# Patient Record
Sex: Female | Born: 1970 | Race: White | Hispanic: No | Marital: Married | Smoking: Never smoker
Health system: Southern US, Community
[De-identification: ages and names within clinical notes are randomized; demographics above are authoritative.]

## PROBLEM LIST (undated history)

## (undated) DIAGNOSIS — I7300 Raynaud's syndrome without gangrene: Secondary | ICD-10-CM

## (undated) DIAGNOSIS — R9409 Abnormal results of other function studies of central nervous system: Secondary | ICD-10-CM

## (undated) DIAGNOSIS — G935 Compression of brain: Secondary | ICD-10-CM

## (undated) DIAGNOSIS — I498 Other specified cardiac arrhythmias: Secondary | ICD-10-CM

## (undated) DIAGNOSIS — I639 Cerebral infarction, unspecified: Secondary | ICD-10-CM

## (undated) DIAGNOSIS — Q796 Ehlers-Danlos syndrome, unspecified: Secondary | ICD-10-CM

## (undated) DIAGNOSIS — T4145XA Adverse effect of unspecified anesthetic, initial encounter: Secondary | ICD-10-CM

## (undated) DIAGNOSIS — D4709 Other mast cell neoplasms of uncertain behavior: Secondary | ICD-10-CM

## (undated) DIAGNOSIS — G90A Postural orthostatic tachycardia syndrome (POTS): Secondary | ICD-10-CM

## (undated) DIAGNOSIS — T8859XA Other complications of anesthesia, initial encounter: Secondary | ICD-10-CM

## (undated) DIAGNOSIS — I951 Orthostatic hypotension: Secondary | ICD-10-CM

## (undated) DIAGNOSIS — G932 Benign intracranial hypertension: Secondary | ICD-10-CM

## (undated) DIAGNOSIS — M419 Scoliosis, unspecified: Secondary | ICD-10-CM

## (undated) DIAGNOSIS — Z9889 Other specified postprocedural states: Secondary | ICD-10-CM

## (undated) DIAGNOSIS — J40 Bronchitis, not specified as acute or chronic: Secondary | ICD-10-CM

## (undated) DIAGNOSIS — R000 Tachycardia, unspecified: Secondary | ICD-10-CM

## (undated) DIAGNOSIS — G43909 Migraine, unspecified, not intractable, without status migrainosus: Secondary | ICD-10-CM

## (undated) DIAGNOSIS — R569 Unspecified convulsions: Secondary | ICD-10-CM

## (undated) DIAGNOSIS — J189 Pneumonia, unspecified organism: Secondary | ICD-10-CM

## (undated) DIAGNOSIS — A0472 Enterocolitis due to Clostridium difficile, not specified as recurrent: Secondary | ICD-10-CM

## (undated) DIAGNOSIS — N319 Neuromuscular dysfunction of bladder, unspecified: Secondary | ICD-10-CM

## (undated) DIAGNOSIS — T7840XA Allergy, unspecified, initial encounter: Secondary | ICD-10-CM

## (undated) DIAGNOSIS — Q760 Spina bifida occulta: Secondary | ICD-10-CM

## (undated) DIAGNOSIS — B019 Varicella without complication: Secondary | ICD-10-CM

## (undated) DIAGNOSIS — G500 Trigeminal neuralgia: Secondary | ICD-10-CM

## (undated) DIAGNOSIS — K219 Gastro-esophageal reflux disease without esophagitis: Secondary | ICD-10-CM

## (undated) DIAGNOSIS — R112 Nausea with vomiting, unspecified: Secondary | ICD-10-CM

## (undated) DIAGNOSIS — R55 Syncope and collapse: Secondary | ICD-10-CM

## (undated) DIAGNOSIS — F319 Bipolar disorder, unspecified: Secondary | ICD-10-CM

## (undated) HISTORY — DX: Trigeminal neuralgia: G50.0

## (undated) HISTORY — PX: OTHER SURGICAL HISTORY: SHX169

## (undated) HISTORY — DX: Scoliosis, unspecified: M41.9

## (undated) HISTORY — DX: Bronchitis, not specified as acute or chronic: J40

## (undated) HISTORY — PX: ROBOTIC ASSISTED LAP VAGINAL HYSTERECTOMY: SHX2362

## (undated) HISTORY — PX: CRANIECTOMY SUBOCCIPITAL W/ CERVICAL LAMINECTOMY / CHIARI: SUR327

## (undated) HISTORY — DX: Varicella without complication: B01.9

## (undated) HISTORY — DX: Enterocolitis due to Clostridium difficile, not specified as recurrent: A04.72

## (undated) HISTORY — DX: Abnormal results of other function studies of central nervous system: R94.09

## (undated) HISTORY — DX: Compression of brain: G93.5

## (undated) HISTORY — DX: Benign intracranial hypertension: G93.2

## (undated) HISTORY — DX: Other mast cell neoplasms of uncertain behavior: D47.09

## (undated) HISTORY — DX: Spina bifida occulta: Q76.0

## (undated) HISTORY — DX: Syncope and collapse: R55

## (undated) HISTORY — DX: Ehlers-Danlos syndrome, unspecified: Q79.60

## (undated) HISTORY — DX: Allergy, unspecified, initial encounter: T78.40XA

## (undated) HISTORY — DX: Migraine, unspecified, not intractable, without status migrainosus: G43.909

## (undated) HISTORY — DX: Gastro-esophageal reflux disease without esophagitis: K21.9

## (undated) HISTORY — DX: Raynaud's syndrome without gangrene: I73.00

---
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
## 2006-02-24 ENCOUNTER — Ambulatory Visit: Payer: Self-pay | Admitting: Critical Care Medicine

## 2006-06-19 ENCOUNTER — Ambulatory Visit (HOSPITAL_COMMUNITY): Admission: RE | Admit: 2006-06-19 | Discharge: 2006-06-19 | Payer: Self-pay | Admitting: Internal Medicine

## 2007-04-13 ENCOUNTER — Encounter (INDEPENDENT_AMBULATORY_CARE_PROVIDER_SITE_OTHER): Payer: Self-pay | Admitting: Internal Medicine

## 2007-04-13 ENCOUNTER — Ambulatory Visit: Payer: Self-pay

## 2007-05-21 HISTORY — PX: ACHILLES TENDON LENGTHENING: SUR826

## 2008-01-19 HISTORY — PX: ACHILLES TENDON REPAIR: SUR1153

## 2008-03-13 ENCOUNTER — Emergency Department (HOSPITAL_BASED_OUTPATIENT_CLINIC_OR_DEPARTMENT_OTHER): Admission: EM | Admit: 2008-03-13 | Discharge: 2008-03-13 | Payer: Self-pay | Admitting: Emergency Medicine

## 2008-05-06 ENCOUNTER — Ambulatory Visit (HOSPITAL_COMMUNITY): Admission: RE | Admit: 2008-05-06 | Discharge: 2008-05-06 | Payer: Self-pay | Admitting: Internal Medicine

## 2008-05-20 HISTORY — PX: ACHILLES TENDON REPAIR: SUR1153

## 2008-10-30 ENCOUNTER — Emergency Department (HOSPITAL_BASED_OUTPATIENT_CLINIC_OR_DEPARTMENT_OTHER): Admission: EM | Admit: 2008-10-30 | Discharge: 2008-10-30 | Payer: Self-pay | Admitting: Emergency Medicine

## 2008-11-24 ENCOUNTER — Emergency Department (HOSPITAL_COMMUNITY): Admission: EM | Admit: 2008-11-24 | Discharge: 2008-11-24 | Payer: Self-pay | Admitting: Emergency Medicine

## 2009-01-02 ENCOUNTER — Ambulatory Visit (HOSPITAL_COMMUNITY): Admission: RE | Admit: 2009-01-02 | Discharge: 2009-01-02 | Payer: Self-pay | Admitting: Internal Medicine

## 2009-05-20 HISTORY — PX: ABDOMINAL HYSTERECTOMY: SHX81

## 2010-10-05 NOTE — Assessment & Plan Note (Signed)
[facility]                               PULMONARY OFFICE NOTE   Jamie Baker, Jamie Baker                          MRN:          [id number]  DATE:02/24/2006                            DOB:          [birth year]    PULMONARY CONSULTATION:   CHIEF COMPLAINT:  Evaluate recurrent pneumonia.   HISTORY OF PRESENT ILLNESS:  A 40 year old white female who has had chronic  recurrent pain that is very deep into the breast area, sinking, goes through  the patient the back and up into the neck.  She is dyspneic with exertion  and also with activities of daily living.  She has a dry, nonproductive  cough.  The pain is sharp and stabbing, worse with a deep breath.  Goes up  into the ears.  She has increased acid reflux that occurs all day long and  also at night.  She has been particularly short of breath since the end of  August, getting worse.  Has an increased stopped-up nose and nasal  congestion.  She has had noted increased wheezing.  She was treated with  Rocephin in August, then followed this with Avelox, azithromycin and then  Medrol Dosepak, which she just finished yesterday.  She uses Ventolin, which  helps to some degree.  She has smoked 1-2 cigarettes a day for two years but  quit smoking 20 years ago.  She is referred for further evaluation.   PAST MEDICAL HISTORY:  Medical:  Essentially negative medical history except  for allergies and a tubal ligation in 1989.   MEDICATION ALLERGIES:  None.   CURRENT MEDICATIONS:  1. Microgestin birth control pills daily.  2. Multivitamin daily.  3. Omega-3 daily.  4. Ventolin p.r.n.  5. Allegra-D p.r.n.  6. Mucinex p.r.n.  7. Imitrex p.r.n.   SOCIAL HISTORY:  As noted above.  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  Lives with her husband and son.   FAMILY HISTORY:  Father had hypertension.  Mother had a stroke.   REVIEW OF SYSTEMS:  Otherwise noncontributory.  Does have some sinus  congestion, headache,  sore throat and difficulty swallowing.   PHYSICAL EXAMINATION:  GENERAL:  This is a youthful female in no distress.  VITAL SIGNS:  Temperature 98.5, blood pressure 108/70, pulse 89, saturation  98% on room air.  CHEST:  Completely clear without evidence of any wheeze, rale or rhonchi.  There was no evidence of any adventitious breath sounds.  CARDIAC:  A regular rate and rhythm without S3, normal S1, S2.  ABDOMEN:  Soft, nontender.  EXTREMITIES:  No edema, clubbing or venous disease.  NEUROLOGIC:  Intact.  HEENT:  No jugular venous distention, no lymphadenopathy.  Oropharynx clear.  NECK:  Supple.   LABORATORY DATA:  Spirometry was obtained today and showed an FEV1 of 2.91,  FVC of 2.32, FEV1/FVC ratio of 88% of predicted.  CT scan of chest was  obtained on February 17, 2006, and showed lungs to be clear, no pulmonary  emboli seen, no atelectasis was seen.   IMPRESSION:  Reflux disease with  no evidence of overt active pneumonia, no  evidence of lower airway inflammation or airway obstruction, with vocal cord  dysfunction syndrome currently.  Most of the chest wall pain could be  musculoskeletal in nature, but much of the pain now may well be reflux-  induced esophagitis.   RECOMMENDATIONS:  Begin Zegerid 40 mg daily, and a reflux diet was given to  the patient.  No inhaled medicines were prescribed.  We will see how the  patient tolerates this plan of care, see her back in return follow-up in one  month for reassessment.  Subsequent GI consultation may be necessary.       Charlcie Cradle Delford Field, MD, FCCP      PEW/MedQ  DD:  02/25/2006  DT:  02/27/2006  Job #:  [id number]   cc:   Lovenia Kim, D.O.

## 2011-05-21 HISTORY — PX: OTHER SURGICAL HISTORY: SHX169

## 2011-06-29 LAB — HM PAP SMEAR: HM Pap smear: NORMAL

## 2011-11-19 ENCOUNTER — Ambulatory Visit (INDEPENDENT_AMBULATORY_CARE_PROVIDER_SITE_OTHER): Payer: BC Managed Care – PPO | Admitting: Family Medicine

## 2011-11-19 ENCOUNTER — Encounter: Payer: Self-pay | Admitting: Family Medicine

## 2011-11-19 VITALS — BP 112/70 | HR 70 | Temp 98.4°F | Ht 64.0 in | Wt 158.4 lb

## 2011-11-19 DIAGNOSIS — G935 Compression of brain: Secondary | ICD-10-CM | POA: Insufficient documentation

## 2011-11-19 DIAGNOSIS — Z0000 Encounter for general adult medical examination without abnormal findings: Secondary | ICD-10-CM | POA: Insufficient documentation

## 2011-11-19 DIAGNOSIS — R209 Unspecified disturbances of skin sensation: Secondary | ICD-10-CM

## 2011-11-19 DIAGNOSIS — R202 Paresthesia of skin: Secondary | ICD-10-CM | POA: Insufficient documentation

## 2011-11-19 DIAGNOSIS — R200 Anesthesia of skin: Secondary | ICD-10-CM

## 2011-11-19 LAB — CBC WITH DIFFERENTIAL/PLATELET
Eosinophils Relative: 1 % (ref 0.0–5.0)
Hemoglobin: 13.7 g/dL (ref 12.0–15.0)
Monocytes Absolute: 0.5 10*3/uL (ref 0.1–1.0)
Neutro Abs: 3.3 10*3/uL (ref 1.4–7.7)
Neutrophils Relative %: 62.9 % (ref 43.0–77.0)
RDW: 13.2 % (ref 11.5–14.6)
WBC: 5.3 10*3/uL (ref 4.5–10.5)

## 2011-11-19 LAB — BASIC METABOLIC PANEL
BUN: 12 mg/dL (ref 6–23)
Chloride: 107 mEq/L (ref 96–112)
GFR: 98.48 mL/min (ref >60.00)
Glucose, Bld: 82 mg/dL (ref 70–99)

## 2011-11-19 LAB — HEPATIC FUNCTION PANEL
ALT: 15 U/L (ref 0–35)
AST: 15 U/L (ref 0–37)
Alkaline Phosphatase: 68 U/L (ref 39–117)

## 2011-11-19 LAB — LIPID PANEL
Cholesterol: 186 mg/dL (ref 0–200)
LDL Cholesterol: 117 mg/dL — ABNORMAL HIGH (ref 0–99)
Total CHOL/HDL Ratio: 3
Triglycerides: 54 mg/dL (ref 0.0–149.0)
VLDL: 10.8 mg/dL (ref 0.0–40.0)

## 2011-11-19 NOTE — Progress Notes (Signed)
  Subjective:    Patient ID: Jamie Baker, female    DOB: 1971/03/12, 41 y.o.   MRN: [id number]  HPI New to establish.  Previous MD- Linton Rump Elisabeth Most.  GYN- Primitivo Gauze.  Neurosurgeon- Kendell Bane  Neuro- Aletha Halim  Desires CPE.  No concerns about health other than current neuro issues.   Review of Systems Patient reports no vision/ hearing changes, adenopathy,fever, weight change,  persistant/recurrent hoarseness , swallowing issues, chest pain, palpitations, edema, persistant/recurrent cough, hemoptysis, dyspnea (rest/exertional/paroxysmal nocturnal), gastrointestinal bleeding (melena, rectal bleeding), abdominal pain, significant heartburn, bowel changes, GU symptoms (dysuria, hematuria, incontinence), Gyn symptoms (abnormal  bleeding, pain),  syncope, focal weakness, memory loss, skin/hair/nail changes, abnormal bruising or bleeding, anxiety, or depression.   + numbness and tingling in hands and feet    Objective:   Physical Exam General Appearance:    Alert, cooperative, no distress, appears stated age  Head:    Normocephalic, without obvious abnormality, atraumatic  Eyes:    PERRL, conjunctiva/corneas clear, EOM's intact, fundi    benign, both eyes  Ears:    Normal TM's and external ear canals, both ears  Nose:   Nares normal, septum midline, mucosa normal, no drainage    or sinus tenderness  Throat:   Lips, mucosa, and tongue normal; teeth and gums normal  Neck:   Supple, symmetrical, trachea midline, no adenopathy;    Thyroid: no enlargement/tenderness/nodules  Back:     Symmetric, no curvature, ROM normal, no CVA tenderness  Lungs:     Clear to auscultation bilaterally, respirations unlabored  Chest Wall:    No tenderness or deformity   Heart:    Regular rate and rhythm, S1 and S2 normal, no murmur, rub   or gallop  Breast Exam:    Deferred to GYN  Abdomen:     Soft, non-tender, bowel sounds active all four quadrants,    no masses, no organomegaly  Genitalia:    Deferred to  GYN  Rectal:    Extremities:   Extremities normal, atraumatic, no cyanosis or edema  Pulses:   2+ and symmetric all extremities  Skin:   Skin color, texture, turgor normal, no rashes or lesions  Lymph nodes:   Cervical, supraclavicular, and axillary nodes normal  Neurologic:   CNII-XII intact, normal strength, sensation and reflexes    throughout          Assessment & Plan:

## 2011-11-19 NOTE — Patient Instructions (Addendum)
We'll notify you of your lab results and make any changes if needed You look great!  Do not stress about the weight Make sure you call neuro and neurosurg about the numbness- we'll rule out the metabolic stuff Call with any questions or concerns Think of [place] as your home base Welcome!  We're glad to have you!!!

## 2011-11-19 NOTE — Assessment & Plan Note (Signed)
New.  Pt w/ multiple neuro/neurosurg issues and is s/p recent surgery for Chiari malformation.  Will check labs to r/o metabolic causes of pt's sxs but strongly encouraged her to call her specialists to discuss sxs.  Pt expressed understanding and is in agreement w/ plan.

## 2011-11-19 NOTE — Assessment & Plan Note (Signed)
New.  Pt's PE WNL.  UTD on GYN.  Check labs.  Anticipatory guidance provided.  

## 2011-11-22 LAB — VITAMIN D 1,25 DIHYDROXY
Vitamin D 1, 25 (OH)2 Total: 50 pg/mL (ref 18–72)
Vitamin D2 1, 25 (OH)2: 12 pg/mL

## 2011-11-25 ENCOUNTER — Encounter: Payer: Self-pay | Admitting: *Deleted

## 2012-05-20 HISTORY — PX: OTHER SURGICAL HISTORY: SHX169

## 2012-07-04 ENCOUNTER — Other Ambulatory Visit: Payer: Self-pay

## 2012-10-07 ENCOUNTER — Encounter: Payer: Self-pay | Admitting: Family Medicine

## 2012-10-07 ENCOUNTER — Ambulatory Visit (INDEPENDENT_AMBULATORY_CARE_PROVIDER_SITE_OTHER): Payer: BC Managed Care – PPO | Admitting: Family Medicine

## 2012-10-07 VITALS — BP 100/68 | HR 84 | Temp 98.2°F | Ht 63.75 in | Wt 163.0 lb

## 2012-10-07 DIAGNOSIS — Z01818 Encounter for other preprocedural examination: Secondary | ICD-10-CM

## 2012-10-07 LAB — PROTIME-INR: Prothrombin Time: 10.3 s (ref 10.2–12.4)

## 2012-10-07 LAB — BASIC METABOLIC PANEL
BUN: 6 mg/dL (ref 6–23)
Calcium: 8.7 mg/dL (ref 8.4–10.5)
Chloride: 102 mEq/L (ref 96–112)
GFR: 98.07 mL/min (ref >60.00)
Potassium: 3.6 mEq/L (ref 3.5–5.1)

## 2012-10-07 LAB — CBC WITH DIFFERENTIAL/PLATELET
Basophils Relative: 0.3 % (ref 0.0–3.0)
Hemoglobin: 12.6 g/dL (ref 12.0–15.0)
Lymphocytes Relative: 23 % (ref 12.0–46.0)
Lymphs Abs: 1.4 10*3/uL (ref 0.7–4.0)
MCV: 87.5 fl (ref 78.0–100.0)
Neutro Abs: 4 10*3/uL (ref 1.4–7.7)
Platelets: 194 10*3/uL (ref 150.0–400.0)
WBC: 6.1 10*3/uL (ref 4.5–10.5)

## 2012-10-07 LAB — HEPATIC FUNCTION PANEL
ALT: 28 U/L (ref 0–35)
Albumin: 3.8 g/dL (ref 3.5–5.2)
Bilirubin, Direct: 0 mg/dL (ref 0.0–0.3)
Total Bilirubin: 0.5 mg/dL (ref 0.3–1.2)

## 2012-10-07 LAB — APTT: aPTT: 25.9 s (ref 21.7–28.8)

## 2012-10-07 NOTE — Patient Instructions (Addendum)
We will fax all labs and office note to Dr Orson Aloe Stop the Aspirin 7-14 days prior to surgery Call with any questions or concerns Good Luck!

## 2012-10-07 NOTE — Progress Notes (Signed)
Pt could not use the bathroom , took specimen home will bring back.Marland KitchenMarland Kitchen

## 2012-10-07 NOTE — Progress Notes (Signed)
Subjective:    Jamie Baker is a 42 y.o. female who presents to the office today for a preoperative consultation at the request of surgeon Dr Orson Aloe who plans on performing Anterior Cervical Discectomy on June 3. This consultation is requested for the specific conditions prompting preoperative evaluation (i.e. because of potential affect on operative risk): no clear pre-op risk. Planned anesthesia: general. The patient has the following known anesthesia issues: no past problems w/ anesthesia. Patients bleeding risk: no recent abnormal bleeding, no remote history of abnormal bleeding and pt plans to stop ASA 7 days prior to surgery. Patient does not have objections to receiving blood products if needed.  The following portions of the patient's history were reviewed and updated as appropriate: allergies, current medications, past family history, past medical history, past social history, past surgical history and problem list.  Review of Systems A comprehensive review of systems was negative except for: headache due to chiari, neck pain.    Objective:    BP 100/68  Pulse 84  Temp(Src) 98.2 F (36.8 C) (Oral)  Ht 5' 3.75" (1.619 m)  Wt 163 lb (73.936 kg)  BMI 28.21 kg/m2  SpO2 95%   General Appearance:    Alert, cooperative, no distress, appears stated age  Head:    Normocephalic, without obvious abnormality, atraumatic  Eyes:    PERRL, conjunctiva/corneas clear, EOM's intact, fundi    benign, both eyes  Ears:    Normal TM's and external ear canals, both ears  Nose:   Nares normal, septum midline, mucosa normal, no drainage    or sinus tenderness  Throat:   Lips, mucosa, and tongue normal; teeth and gums normal  Neck:   Supple, symmetrical, trachea midline, no adenopathy;    Thyroid: no enlargement/tenderness/nodules  Back:     Symmetric, no curvature, ROM normal, no CVA tenderness  Lungs:     Clear to auscultation bilaterally, respirations unlabored  Chest Wall:    No tenderness  or deformity   Heart:    Regular rate and rhythm, S1 and S2 normal, no murmur, rub   or gallop  Breast Exam:    No tenderness, masses, or nipple abnormality  Abdomen:     Soft, non-tender, bowel sounds active all four quadrants,    no masses, no organomegaly  Genitalia:    External genitalia normal, cervix normal in appearance, no CMT, uterus in normal size and position, adnexa w/out mass or tenderness, mucosa pink and moist, no lesions or discharge present  Rectal:    Normal external appearance  Extremities:   Extremities normal, atraumatic, no cyanosis or edema  Pulses:   2+ and symmetric all extremities  Skin:   Skin color, texture, turgor normal, no rashes or lesions  Lymph nodes:   Cervical, supraclavicular, and axillary nodes normal  Neurologic:   CNII-XII intact, normal strength, sensation and reflexes    throughout     Predictors of intubation difficulty:  Morbid obesity? no  Anatomically abnormal facies? no  Prominent incisors? no  Receding mandible? yes - mild  Short, thick neck? no  Neck range of motion: normal flexion/extension- some difficulty w/ rotation  Dentition: No chipped, loose, or missing teeth.  Cardiographics ECG: short PR interval, inverted T waves in V1/V2 Echocardiogram: not done  Imaging Chest x-ray: NA   Lab Review  Labs ordered today- will attach      Assessment:      42 y.o. female with planned surgery as above.   Known risk factors for  perioperative complications: None   Difficulty with intubation is not anticipated.  Cardiac Risk Estimation: low- had completely normal cardiac w/u in April 2014  Current medications which may produce withdrawal symptoms if withheld perioperatively: none      Plan:    1. Preoperative workup as follows ECG, hemoglobin, hematocrit, electrolytes, creatinine, glucose, liver function studies, coagulation studies, urinalysis (urinary tract instrumentation planned). 2. Change in medication regimen before surgery:  discontinue ASA 14 days before surgery. 3. Prophylaxis for cardiac events with perioperative beta-blockers: not indicated. 4. Invasive hemodynamic monitoring perioperatively: at the discretion of anesthesiologist. 5. Deep vein thrombosis prophylaxis postoperatively:regimen to be chosen by surgical team. 6. Surveillance for postoperative MI with ECG immediately postoperatively and on postoperative days 1 and 2 AND troponin levels 24 hours postoperatively and on day 4 or hospital discharge (whichever comes first): at the discretion of anesthesiologist. 7. Other measures: none

## 2012-10-08 LAB — POCT URINALYSIS DIPSTICK
Glucose, UA: NEGATIVE
Leukocytes, UA: NEGATIVE
Nitrite, UA: NEGATIVE
Protein, UA: NEGATIVE
Urobilinogen, UA: 0.2
pH, UA: 6

## 2012-10-08 LAB — ABO AND RH

## 2013-02-07 ENCOUNTER — Encounter (HOSPITAL_BASED_OUTPATIENT_CLINIC_OR_DEPARTMENT_OTHER): Payer: Self-pay

## 2013-02-07 ENCOUNTER — Emergency Department (HOSPITAL_BASED_OUTPATIENT_CLINIC_OR_DEPARTMENT_OTHER): Payer: BC Managed Care – PPO

## 2013-02-07 ENCOUNTER — Emergency Department (HOSPITAL_BASED_OUTPATIENT_CLINIC_OR_DEPARTMENT_OTHER)
Admission: EM | Admit: 2013-02-07 | Discharge: 2013-02-07 | Disposition: A | Discharge status: Home / Self Care | Payer: BC Managed Care – PPO | Attending: Emergency Medicine | Admitting: Emergency Medicine

## 2013-02-07 DIAGNOSIS — G43909 Migraine, unspecified, not intractable, without status migrainosus: Secondary | ICD-10-CM | POA: Insufficient documentation

## 2013-02-07 DIAGNOSIS — Z8709 Personal history of other diseases of the respiratory system: Secondary | ICD-10-CM | POA: Insufficient documentation

## 2013-02-07 DIAGNOSIS — Z7982 Long term (current) use of aspirin: Secondary | ICD-10-CM | POA: Insufficient documentation

## 2013-02-07 DIAGNOSIS — Z8619 Personal history of other infectious and parasitic diseases: Secondary | ICD-10-CM | POA: Insufficient documentation

## 2013-02-07 DIAGNOSIS — IMO0002 Reserved for concepts with insufficient information to code with codable children: Secondary | ICD-10-CM | POA: Insufficient documentation

## 2013-02-07 DIAGNOSIS — R51 Headache: Secondary | ICD-10-CM | POA: Insufficient documentation

## 2013-02-07 DIAGNOSIS — G935 Compression of brain: Secondary | ICD-10-CM | POA: Insufficient documentation

## 2013-02-07 DIAGNOSIS — R519 Headache, unspecified: Secondary | ICD-10-CM

## 2013-02-07 DIAGNOSIS — Z79899 Other long term (current) drug therapy: Secondary | ICD-10-CM | POA: Insufficient documentation

## 2013-02-07 DIAGNOSIS — K219 Gastro-esophageal reflux disease without esophagitis: Secondary | ICD-10-CM | POA: Insufficient documentation

## 2013-02-07 LAB — BASIC METABOLIC PANEL
Calcium: 9.4 mg/dL (ref 8.4–10.5)
Creatinine, Ser: 0.8 mg/dL (ref 0.50–1.10)
GFR calc non Af Amer: 88 mL/min — ABNORMAL LOW (ref >90)
Sodium: 140 mEq/L (ref 135–145)

## 2013-02-07 LAB — CBC WITH DIFFERENTIAL/PLATELET
Basophils Absolute: 0 10*3/uL (ref 0.0–0.1)
Basophils Relative: 0 % (ref 0–1)
Eosinophils Absolute: 0.1 10*3/uL (ref 0.0–0.7)
Eosinophils Relative: 1 % (ref 0–5)
HCT: 40.5 % (ref 36.0–46.0)
Lymphocytes Relative: 17 % (ref 12–46)
MCH: 29.3 pg (ref 26.0–34.0)
MCHC: 32.8 g/dL (ref 30.0–36.0)
MCV: 89.2 fL (ref 78.0–100.0)
Monocytes Absolute: 0.6 10*3/uL (ref 0.1–1.0)
Platelets: 213 10*3/uL (ref 150–400)
RBC: 4.54 MIL/uL (ref 3.87–5.11)
RDW: 13.2 % (ref 11.5–15.5)
WBC: 5.3 10*3/uL (ref 4.0–10.5)

## 2013-02-07 MED ORDER — DIPHENHYDRAMINE HCL 50 MG/ML IJ SOLN
25.0000 mg | Freq: Once | INTRAMUSCULAR | Status: AC
  Administered 2013-02-07: 25 mg via INTRAVENOUS
  Filled 2013-02-07: qty 1

## 2013-02-07 MED ORDER — SODIUM CHLORIDE 0.9 % IV SOLN
INTRAVENOUS | Status: DC
  Administered 2013-02-07: 13:00:00 via INTRAVENOUS

## 2013-02-07 MED ORDER — HYDROMORPHONE HCL PF 1 MG/ML IJ SOLN
1.0000 mg | Freq: Once | INTRAMUSCULAR | Status: AC
  Administered 2013-02-07: 1 mg via INTRAVENOUS
  Filled 2013-02-07: qty 1

## 2013-02-07 MED ORDER — PROMETHAZINE HCL 25 MG/ML IJ SOLN
12.5000 mg | Freq: Once | INTRAMUSCULAR | Status: AC
  Administered 2013-02-07: 12.5 mg via INTRAVENOUS
  Filled 2013-02-07: qty 1

## 2013-02-07 MED ORDER — SODIUM CHLORIDE 0.9 % IV BOLUS (SEPSIS)
500.0000 mL | Freq: Once | INTRAVENOUS | Status: AC
  Administered 2013-02-07: 500 mL via INTRAVENOUS

## 2013-02-07 NOTE — ED Provider Notes (Signed)
CSN: [id number]     Arrival date & time 02/07/13  4098 History   First MD Initiated Contact with Patient 02/07/13 1040     Chief Complaint  Patient presents with  . Headache   (Consider location/radiation/quality/duration/timing/severity/associated sxs/prior Treatment) Patient is a 42 y.o. female presenting with headaches. The history is provided by the patient and the spouse.  Headache Associated symptoms: nausea   Associated symptoms: no abdominal pain, no back pain, no congestion, no fever, no numbness and no vomiting    patient with history of chronic headaches. Patient also has a cart Re: 1 malformation. Followed by a specialist in [place]. Today with worse headaches 7/10 all over but more so on the left side of the face with some swelling which is typical for her the never did explain why. Associated with nausea but no vomiting no fever no focal neuro deficits. Patient has hydromorphone orally to take at home it is not helped the headache. Pain is described as a severe pressure all over head.  Past Medical History  Diagnosis Date  . Chiari I malformation   . Chicken pox   . Bronchitis   . Migraine   . Allergy   . GERD (gastroesophageal reflux disease)    Past Surgical History  Procedure Laterality Date  . Craniectomy suboccipital w/ cervical laminectomy / chiari    . Abdominal hysterectomy  2011  . Tonsillectomy and adenoidectomy  1975  . Tubal ligation  1989  . Achilles tendon lengthening  2009  . Achilles tendon repair  2010  . Chairi decompasion  2013   Family History  Problem Relation Age of Onset  . Arthritis Mother   . Breast cancer Mother   . Stroke Mother   . Hyperlipidemia Father   . Hypertension Father   . Diabetes Father   . Brain cancer Father   . Breast cancer Maternal Aunt   . Arthritis Maternal Grandmother   . Breast cancer Maternal Grandmother   . Stroke Maternal Grandfather   . Heart disease Maternal Grandfather    History  Substance Use Topics   . Smoking status: Never Smoker   . Smokeless tobacco: Not on file  . Alcohol Use: Yes     Comment: occasionally   OB History   Grav Para Term Preterm Abortions TAB SAB Ect Mult Living                 Review of Systems  Constitutional: Negative for fever.  HENT: Positive for facial swelling. Negative for congestion.   Respiratory: Negative for shortness of breath.   Cardiovascular: Negative for chest pain.  Gastrointestinal: Positive for nausea. Negative for vomiting and abdominal pain.  Genitourinary: Negative for dysuria.  Musculoskeletal: Negative for back pain.  Skin: Negative for rash.  Neurological: Positive for headaches. Negative for weakness and numbness.  Psychiatric/Behavioral: Negative for confusion.    Allergies  Levaquin  Home Medications   Current Outpatient Rx  Name  Route  Sig  Dispense  Refill  . darifenacin (ENABLEX) 7.5 MG 24 hr tablet   Oral   Take 7.5 mg by mouth daily.         . fludrocortisone (FLORINEF) 0.1 MG tablet   Oral   Take 0.1 mg by mouth daily.         . hydrocortisone (CORTEF) 10 MG tablet   Oral   Take 10 mg by mouth daily.         . propranolol (INDERAL) 20 MG tablet  Oral   Take 20 mg by mouth 3 (three) times daily.         Marland Kitchen aspirin EC 325 MG tablet   Oral   Take 325 mg by mouth 3 (three) times daily.         . bethanechol (URECHOLINE) 10 MG tablet   Oral   Take 2 tablets by mouth 3 (three) times daily.         . cetirizine (ZYRTEC) 10 MG tablet   Oral   Take 10 mg by mouth daily.         . ranitidine (ZANTAC) 150 MG tablet   Oral   Take 150 mg by mouth daily.         . tamsulosin (FLOMAX) 0.4 MG CAPS   Oral   Take 1 capsule by mouth daily.         Marland Kitchen topiramate (TOPAMAX) 100 MG tablet                BP 127/75  Pulse 94  Temp(Src) 98.3 F (36.8 C)  Resp 18  SpO2 100% Physical Exam  Nursing note and vitals reviewed. Constitutional: She is oriented to person, place, and time. She  appears well-developed and well-nourished. No distress.  HENT:  Head: Normocephalic and atraumatic.  Mouth/Throat: Oropharynx is clear and moist.  Eyes: Conjunctivae and EOM are normal. Pupils are equal, round, and reactive to light.  Neck: Normal range of motion. Neck supple.  Cardiovascular: Normal rate, regular rhythm, normal heart sounds and intact distal pulses.   Pulmonary/Chest: Effort normal and breath sounds normal.  Abdominal: Soft. Bowel sounds are normal. There is no tenderness.  Musculoskeletal: Normal range of motion.  Neurological: She is alert and oriented to person, place, and time. No cranial nerve deficit. She exhibits normal muscle tone. Coordination normal.  Skin: Skin is warm. No rash noted.    ED Course  Procedures (including critical care time) Labs Review Labs Reviewed  BASIC METABOLIC PANEL - Abnormal; Notable for the following:    GFR calc non Af Amer 88 (*)    All other components within normal limits  CBC WITH DIFFERENTIAL   Results for orders placed during the hospital encounter of 02/07/13  CBC WITH DIFFERENTIAL      Result Value Range   WBC 5.3  4.0 - 10.5 K/uL   RBC 4.54  3.87 - 5.11 MIL/uL   Hemoglobin 13.3  12.0 - 15.0 g/dL   HCT 16.1  09.6 - 04.5 %   MCV 89.2  78.0 - 100.0 fL   MCH 29.3  26.0 - 34.0 pg   MCHC 32.8  30.0 - 36.0 g/dL   RDW 40.9  81.1 - 91.4 %   Platelets 213  150 - 400 K/uL   Neutrophils Relative % 70  43 - 77 %   Neutro Abs 3.7  1.7 - 7.7 K/uL   Lymphocytes Relative 17  12 - 46 %   Lymphs Abs 0.9  0.7 - 4.0 K/uL   Monocytes Relative 12  3 - 12 %   Monocytes Absolute 0.6  0.1 - 1.0 K/uL   Eosinophils Relative 1  0 - 5 %   Eosinophils Absolute 0.1  0.0 - 0.7 K/uL   Basophils Relative 0  0 - 1 %   Basophils Absolute 0.0  0.0 - 0.1 K/uL  BASIC METABOLIC PANEL      Result Value Range   Sodium 140  135 - 145 mEq/L   Potassium 3.9  3.5 - 5.1 mEq/L   Chloride 103  96 - 112 mEq/L   CO2 28  19 - 32 mEq/L   Glucose, Bld 83   70 - 99 mg/dL   BUN 16  6 - 23 mg/dL   Creatinine, Ser 6.57  0.50 - 1.10 mg/dL   Calcium 9.4  8.4 - 84.6 mg/dL   GFR calc non Af Amer 88 (*) >90 mL/min   GFR calc Af Amer >90  >90 mL/min    Imaging Review Ct Head Wo Contrast  02/07/2013   CLINICAL DATA:  Left temporal headaches, history of Chiari malformation, prior craniectomy for decompression  EXAM: CT HEAD WITHOUT CONTRAST  TECHNIQUE: Contiguous axial images were obtained from the base of the skull through the vertex without intravenous contrast.  COMPARISON:  11/24/2008  FINDINGS: No evidence of parenchymal hemorrhage or extra-axial fluid collection. No mass lesion, mass effect, or midline shift.  No CT evidence of acute infarction.  Cerebral volume is within normal limits.  No ventriculomegaly.  Tonsillar crowding at the foramen magnum, related to known Chiari malformation, with prior suboccipital craniectomy (series 2/image 4).  The visualized paranasal sinuses are essentially clear. The mastoid air cells are unopacified.  No evidence of calvarial fracture.  IMPRESSION: No evidence of acute intracranial abnormality.  Tonsillar crowding at the foramen magnum, related to known Chiari malformation, with prior suboccipital craniectomy.   Electronically Signed   By: Charline Bills M.D.   On: 02/07/2013 12:33    MDM   1. Headache   2. Chiari I malformation    Patient's head CT any acute changes. Patient's headache improved with pain medication in the emergency department. Patient will followup with her specialist in [place] by phone tomorrow. Patient will return for any new or worse symptoms.    Shelda Jakes, MD 02/07/13 [phone]

## 2013-02-07 NOTE — ED Notes (Signed)
Patient here with headache that she describes as left temporal and severe pressure all over her head. Reports daily chronic headaches but this is worse. Took dilaudid 2mg  tablets x 2 with no relief last pm 2000. Nausea with same.

## 2013-03-25 ENCOUNTER — Other Ambulatory Visit: Payer: Self-pay

## 2013-05-25 ENCOUNTER — Ambulatory Visit (INDEPENDENT_AMBULATORY_CARE_PROVIDER_SITE_OTHER): Payer: BC Managed Care – PPO | Admitting: Nurse Practitioner

## 2013-05-25 ENCOUNTER — Encounter: Payer: Self-pay | Admitting: Nurse Practitioner

## 2013-05-25 ENCOUNTER — Telehealth: Payer: Self-pay | Admitting: *Deleted

## 2013-05-25 VITALS — BP 110/64 | HR 72 | Temp 98.2°F | Ht 63.7 in | Wt 175.2 lb

## 2013-05-25 DIAGNOSIS — J189 Pneumonia, unspecified organism: Secondary | ICD-10-CM

## 2013-05-25 DIAGNOSIS — Z8701 Personal history of pneumonia (recurrent): Secondary | ICD-10-CM

## 2013-05-25 DIAGNOSIS — R059 Cough, unspecified: Secondary | ICD-10-CM

## 2013-05-25 DIAGNOSIS — R05 Cough: Secondary | ICD-10-CM

## 2013-05-25 LAB — CBC
HCT: 40 % (ref 36.0–46.0)
HEMOGLOBIN: 13.3 g/dL (ref 12.0–15.0)
MCHC: 33.3 g/dL (ref 30.0–36.0)
MCV: 87.9 fl (ref 78.0–100.0)
PLATELETS: 209 10*3/uL (ref 150.0–400.0)
RBC: 4.55 Mil/uL (ref 3.87–5.11)
RDW: 13.4 % (ref 11.5–14.6)
WBC: 4.8 10*3/uL (ref 4.5–10.5)

## 2013-05-25 MED ORDER — PREDNISONE 10 MG PO TABS: ORAL_TABLET | ORAL | Status: DC

## 2013-05-25 MED ORDER — BENZONATATE 100 MG PO CAPS: ORAL_CAPSULE | ORAL | Status: DC

## 2013-05-25 NOTE — Telephone Encounter (Signed)
[facility] pharmacy called about rx for benzonatate 100 mg that was sent in to pharmacy. Pharmacist stated that medication is on back order until March. Pharmacist wanted to know if you want to substitute another medication. Please advise?

## 2013-05-25 NOTE — Telephone Encounter (Signed)
Patient notified by [facility].

## 2013-05-25 NOTE — Progress Notes (Signed)
Pre-visit discussion using our clinic review tool. No additional management support is needed unless otherwise documented below in the visit note.  

## 2013-05-25 NOTE — Telephone Encounter (Signed)
Cbc nml- no infection. Will start prednisone to tx post infection pneumonitis. Pt will call if wants [place] to send benzonatate to another pharmacy-her pharm is out til March.

## 2013-05-25 NOTE — Telephone Encounter (Signed)
Ask pt if she wants to use another pharmacy.

## 2013-05-25 NOTE — Progress Notes (Signed)
   Subjective:    Patient ID: Jamie Baker, female    DOB: 03/12/1971, 43 y.o.   MRN: [id number]  Cough This is a chronic problem. The current episode started 1 to 4 weeks ago (1 mo. Pt went to UC 2 wks ago, started amoxicillin. sputum has cleared in color, but is persistent.). The problem has been gradually improving. The problem occurs every few minutes. The cough is productive of sputum. Associated symptoms include headaches (has chronic HA, but cough makes worse). Pertinent negatives include no chest pain, chills, ear pain, fever, nasal congestion, postnasal drip, sore throat, shortness of breath or wheezing. Nothing aggravates the symptoms. Treatments tried: finished amoxicllin, phergan cough syrup. The treatment provided mild relief. Her past medical history is significant for pneumonia (recent, treated 2-3 weeks ago.).      Review of Systems  Constitutional: Negative for fever, chills and fatigue.  HENT: Positive for congestion. Negative for ear pain, postnasal drip and sore throat.   Respiratory: Positive for cough and chest tightness. Negative for shortness of breath and wheezing.   Cardiovascular: Negative for chest pain.  Gastrointestinal: Negative for nausea and abdominal pain.  Musculoskeletal: Negative for back pain.  Neurological: Positive for seizures (Hx of seizures) and headaches (has chronic HA, but cough makes worse).  Hematological: Negative for adenopathy.       Objective:   Physical Exam  Vitals reviewed. Constitutional: She is oriented to person, place, and time. She appears well-developed and well-nourished. No distress.  HENT:  Head: Normocephalic and atraumatic.  Right Ear: External ear normal.  Left Ear: External ear normal.  Nose: Nose normal.  Mouth/Throat: Oropharynx is clear and moist. No oropharyngeal exudate.  Eyes: Conjunctivae are normal. Right eye exhibits no discharge. Left eye exhibits no discharge.  Neck: Normal range of motion. Neck supple. No  thyromegaly present.  Cardiovascular: Normal rate, regular rhythm and normal heart sounds.   No murmur heard. Pulmonary/Chest: Effort normal and breath sounds normal. No respiratory distress. She has no wheezes.  Occasional cough during exam  Lymphadenopathy:    She has no cervical adenopathy.  Neurological: She is alert and oriented to person, place, and time.  Skin: Skin is warm and dry.  Psychiatric: She has a normal mood and affect. Her behavior is normal. Thought content normal.          Assessment & Plan:  1. Cough DD: post-infection pneumonitis, pneumonia, post-nasal drip - benzonatate (TESSALON) 100 MG capsule; Take 1-2 capsules po up to 3 times daily PRN cough  Dispense: 60 capsule; Refill: 0 - CBC  2. History of recent pneumonia Pt declined CXR - CBC  See pt instructions.

## 2013-05-25 NOTE — Patient Instructions (Signed)
If white cells are elevated I will start antibiotic, if normal, I will prescribe short course of prednisone. Continue to use daily sinus rinse. Start benzonatate capsules to decrease cough, use throat lozenges with benzocaine to help numb throat & make less sensitive to cough stimuli, use hot tea w/honey. Feel better!  Pneumonitis Pneumonitis is inflammation of the lungs.  CAUSES  Many things can cause pneumonitis. These can include:   A bacterial or viral infection. Pneumonitis due to an infection is usually called pneumonia.  Work-related exposures, including farm and industrial work. Some substances that can cause pneumonitis include asbestos, silica, inhaled acids, or inhaled chlorine gas.   Repeated exposure to bird feathers, bird feces, or other allergens.   Medicine such as chemotherapy drugs, certain antibiotics, and some heart medicines.   Radiation therapy.   Exposure to mold. A hot tub, sauna, or home humidifier can have mold growing in it, even if it looks clean. The mold can be breathed in through water vapor.  Breathing (aspirating) stomach contents, food, or liquids into the lungs.  SIGNS AND SYMPTOMS   Cough.   Shortness of breath or difficulty breathing.   Fever.   Decreased energy.   Decreased appetite.  DIAGNOSIS  To diagnose pneumonitis, your health care provider will do a complete history and physical exam. Various tests may be ordered, such as:   Pulmonary function test.   Chest X-ray.   CT scan of the lungs.   Bronchoscopy.   Lung biopsy.  TREATMENT  Treatment will depend on the cause of the pneumonitis. If the cause is exposure to a substance, avoiding further exposure to that substance will help reduce your symptoms. Possible medical treatments for pneumonitis include:   Corticosteroid medicine to help decrease inflammation in the lungs.   Antibiotic medicine to help fight a bacterial lung infection.   Oxygen therapy if you  are having difficulty breathing.  HOME CARE INSTRUCTIONS   Avoid exposure to any substance identified as the cause of your pneumonitis.   If you must continue to work with substances that can cause pneumonitis, wear a mask to protect your lungs.   Only take over-the-counter or prescription medicine as directed by your health care provider.   Do not smoke.   If you use inhalers, keep them with you at all times.   Follow up with your health care provider as directed.  SEEK IMMEDIATE MEDICAL CARE IF:   You develop new or increased shortness of breath.   You develop a blue color (cyanosis) under your fingernails.   You have a fever.  MAKE SURE YOU:   Understand these instructions.  Will watch your condition.  Will get help right away if you are not doing well or get worse. Document Released: 10/24/2009 Document Revised: 01/06/2013 Document Reviewed: 10/26/2012 [facility] Patient Information 2014 [place], [place].

## 2013-06-21 ENCOUNTER — Encounter: Payer: Self-pay | Admitting: Nurse Practitioner

## 2013-06-21 ENCOUNTER — Ambulatory Visit (INDEPENDENT_AMBULATORY_CARE_PROVIDER_SITE_OTHER): Payer: BC Managed Care – PPO | Admitting: Nurse Practitioner

## 2013-06-21 VITALS — BP 99/66 | HR 74 | Temp 97.7°F | Ht 63.7 in | Wt 178.0 lb

## 2013-06-21 DIAGNOSIS — I951 Orthostatic hypotension: Secondary | ICD-10-CM

## 2013-06-21 DIAGNOSIS — G90A Postural orthostatic tachycardia syndrome (POTS): Secondary | ICD-10-CM | POA: Insufficient documentation

## 2013-06-21 DIAGNOSIS — R000 Tachycardia, unspecified: Secondary | ICD-10-CM

## 2013-06-21 DIAGNOSIS — R059 Cough, unspecified: Secondary | ICD-10-CM

## 2013-06-21 DIAGNOSIS — J329 Chronic sinusitis, unspecified: Secondary | ICD-10-CM

## 2013-06-21 DIAGNOSIS — B9689 Other specified bacterial agents as the cause of diseases classified elsewhere: Secondary | ICD-10-CM

## 2013-06-21 DIAGNOSIS — I498 Other specified cardiac arrhythmias: Secondary | ICD-10-CM | POA: Insufficient documentation

## 2013-06-21 DIAGNOSIS — R05 Cough: Secondary | ICD-10-CM

## 2013-06-21 DIAGNOSIS — A499 Bacterial infection, unspecified: Secondary | ICD-10-CM

## 2013-06-21 MED ORDER — AMOXICILLIN-POT CLAVULANATE 875-125 MG PO TABS: 1.0000 | ORAL_TABLET | Freq: Two times a day (BID) | ORAL | Status: DC

## 2013-06-21 NOTE — Patient Instructions (Signed)
Start antibiotic. Start daily sinus rinses (Neilmed Sinus rinse). You may use pseudoephedrine 30 mg twice daily for 4-6 days. Take probiotic daily, alternating Culterelle and Align. Please call for re-evaluation if you are not improving.  Sinusitis Sinusitis is redness, soreness, and swelling (inflammation) of the paranasal sinuses. Paranasal sinuses are air pockets within the bones of your face (beneath the eyes, the middle of the forehead, or above the eyes). In healthy paranasal sinuses, mucus is able to drain out, and air is able to circulate through them by way of your nose. However, when your paranasal sinuses are inflamed, mucus and air can become trapped. This can allow bacteria and other germs to grow and cause infection. Sinusitis can develop quickly and last only a short time (acute) or continue over a long period (chronic). Sinusitis that lasts for more than 12 weeks is considered chronic.  CAUSES  Causes of sinusitis include:  Allergies.  Structural abnormalities, such as displacement of the cartilage that separates your nostrils (deviated septum), which can decrease the air flow through your nose and sinuses and affect sinus drainage.  Functional abnormalities, such as when the small hairs (cilia) that line your sinuses and help remove mucus do not work properly or are not present. SYMPTOMS  Symptoms of acute and chronic sinusitis are the same. The primary symptoms are pain and pressure around the affected sinuses. Other symptoms include:  Upper toothache.  Earache.  Headache.  Bad breath.  Decreased sense of smell and taste.  A cough, which worsens when you are lying flat.  Fatigue.  Fever.  Thick drainage from your nose, which often is green and may contain pus (purulent).  Swelling and warmth over the affected sinuses. DIAGNOSIS  Your caregiver will perform a physical exam. During the exam, your caregiver may:  Look in your nose for signs of abnormal growths in  your nostrils (nasal polyps).  Tap over the affected sinus to check for signs of infection.  View the inside of your sinuses (endoscopy) with a special imaging device with a light attached (endoscope), which is inserted into your sinuses. If your caregiver suspects that you have chronic sinusitis, one or more of the following tests may be recommended:  Allergy tests.  Nasal culture A sample of mucus is taken from your nose and sent to a lab and screened for bacteria.  Nasal cytology A sample of mucus is taken from your nose and examined by your caregiver to determine if your sinusitis is related to an allergy. TREATMENT  Most cases of acute sinusitis are related to a viral infection and will resolve on their own within 10 days. Sometimes medicines are prescribed to help relieve symptoms (pain medicine, decongestants, nasal steroid sprays, or saline sprays).  However, for sinusitis related to a bacterial infection, your caregiver will prescribe antibiotic medicines. These are medicines that will help kill the bacteria causing the infection.  Rarely, sinusitis is caused by a fungal infection. In theses cases, your caregiver will prescribe antifungal medicine. For some cases of chronic sinusitis, surgery is needed. Generally, these are cases in which sinusitis recurs more than 3 times per year, despite other treatments. HOME CARE INSTRUCTIONS   Drink plenty of water. Water helps thin the mucus so your sinuses can drain more easily.  Use a humidifier.  Inhale steam 3 to 4 times a day (for example, sit in the bathroom with the shower running).  Apply a warm, moist washcloth to your face 3 to 4 times a day, or  as directed by your caregiver.  Use saline nasal sprays to help moisten and clean your sinuses.  Take over-the-counter or prescription medicines for pain, discomfort, or fever only as directed by your caregiver. SEEK IMMEDIATE MEDICAL CARE IF:  You have increasing pain or severe  headaches.  You have nausea, vomiting, or drowsiness.  You have swelling around your face.  You have vision problems.  You have a stiff neck.  You have difficulty breathing. MAKE SURE YOU:   Understand these instructions.  Will watch your condition.  Will get help right away if you are not doing well or get worse. Document Released: 05/06/2005 Document Revised: 07/29/2011 Document Reviewed: 05/21/2011 [facility] Patient Information 2014 [place], [place].

## 2013-06-21 NOTE — Progress Notes (Signed)
   Subjective:    Patient ID: Jamie Baker, female    DOB: 1971/01/21, 43 y.o.   MRN: [id number]  Cough This is a chronic problem. The current episode started 1 to 4 weeks ago (1 mo ago). The problem has been gradually improving (coughing less, but now has yellow mucous drainage). The problem occurs every few hours. The cough is non-productive. 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 Pertinent negatives include no chest pain, chills, ear congestion, ear pain, fever, sore throat, shortness of breath or wheezing. Nothing aggravates the symptoms. Treatments tried: augmentin 1 mo. ago. Short course prednisone 3 weeks ago. The treatment provided mild relief. Her past medical history is significant for pneumonia (2009, confirmed by chest CT. RLL.). pt planning to have IC monitor placed in 3 weeks.      Review of Systems  Constitutional: Negative for fever, chills and fatigue.  HENT: Positive for congestion and postnasal drip. Negative for ear pain and sore throat.   Respiratory: Positive for cough. Negative for chest tightness, shortness of breath and wheezing.        Felt like couldn't get deep breath over weekend. Feels better today. Used inhaler. States does not have asthma, but was given inhaler for past "infection".   Cardiovascular: Negative for chest pain.  Gastrointestinal: Negative for nausea, abdominal pain and diarrhea.  Musculoskeletal: Negative for back pain.  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 Negative for weakness.       Objective:   Physical Exam  Vitals reviewed. Constitutional: She is oriented to person, place, and time. She appears well-developed and well-nourished. No distress.  HENT:  Head: Normocephalic and atraumatic.  Right Ear: External ear normal.  Left Ear: External ear normal.  Mouth/Throat:  Oropharynx is clear and moist. No oropharyngeal exudate.  Thick purulent d/c L nare. Small effusion R TM. L TM nml.  Eyes: Conjunctivae are normal. Right eye exhibits no discharge. Left eye exhibits no discharge.  Neck: Normal range of motion. Neck supple. No thyromegaly present.  Cardiovascular: Normal rate, regular rhythm and normal heart sounds.   No murmur heard. Pulmonary/Chest: Effort normal. No respiratory distress. She has no wheezes.  Musculoskeletal: Normal range of motion.  Lymphadenopathy:    She has no cervical adenopathy.  Neurological: She is alert and oriented to person, place, and time.  Skin: Skin is warm and dry.  Psychiatric: She has a normal mood and affect. Her behavior is normal. Thought content normal.          Assessment & Plan:  1. Bacterial sinusitis Duration 1 mo. Had augmentin 1  Mo. Ago. Having brain surgery in 3 weeks. - amoxicillin-clavulanate (AUGMENTIN) 875-125 MG per tablet; Take 1 tablet by mouth 2 (two) times daily.  Dispense: 10 tablet; Refill: 0 See pt instructions.  2 Cough Gradually getting better. Non-productive. Likely r/t post-nasal drip. Hx RLL pneumonia 2009.  3 mild hypotension 99/66 Hx POTTS, diagnosed by neuro. Takes fludrocortisone when BP <100. Advised hydration.

## 2013-06-21 NOTE — Progress Notes (Signed)
Pre-visit discussion using our clinic review tool. No additional management support is needed unless otherwise documented below in the visit note.  

## 2013-06-28 ENCOUNTER — Encounter: Payer: Self-pay | Admitting: Family Medicine

## 2013-06-28 ENCOUNTER — Ambulatory Visit (INDEPENDENT_AMBULATORY_CARE_PROVIDER_SITE_OTHER): Payer: BC Managed Care – PPO | Admitting: Family Medicine

## 2013-06-28 VITALS — BP 106/70 | HR 88 | Temp 97.9°F | Resp 16 | Ht 64.5 in | Wt 180.4 lb

## 2013-06-28 DIAGNOSIS — Z01818 Encounter for other preprocedural examination: Secondary | ICD-10-CM

## 2013-06-28 LAB — POCT URINALYSIS DIPSTICK
Bilirubin, UA: NEGATIVE
Glucose, UA: NEGATIVE
Ketones, UA: NEGATIVE
Leukocytes, UA: NEGATIVE
Nitrite, UA: NEGATIVE
PROTEIN UA: NEGATIVE
RBC UA: NEGATIVE
Spec Grav, UA: <=1.005
Urobilinogen, UA: 0.2
pH, UA: 6.5

## 2013-06-28 LAB — BASIC METABOLIC PANEL
BUN: 12 mg/dL (ref 6–23)
CO2: 28 mEq/L (ref 19–32)
CREATININE: 0.7 mg/dL (ref 0.4–1.2)
Calcium: 8.9 mg/dL (ref 8.4–10.5)
Chloride: 105 mEq/L (ref 96–112)
GFR: 93.07 mL/min (ref >60.00)
GLUCOSE: 88 mg/dL (ref 70–99)
Potassium: 3.4 mEq/L — ABNORMAL LOW (ref 3.5–5.1)
SODIUM: 140 meq/L (ref 135–145)

## 2013-06-28 LAB — HEPATIC FUNCTION PANEL
ALBUMIN: 3.8 g/dL (ref 3.5–5.2)
ALT: 16 U/L (ref 0–35)
AST: 17 U/L (ref 0–37)
Alkaline Phosphatase: 72 U/L (ref 39–117)
Bilirubin, Direct: 0 mg/dL (ref 0.0–0.3)
Total Bilirubin: 0.3 mg/dL (ref 0.3–1.2)
Total Protein: 6.8 g/dL (ref 6.0–8.3)

## 2013-06-28 LAB — CBC WITH DIFFERENTIAL/PLATELET
BASOS PCT: 0.3 % (ref 0.0–3.0)
Basophils Absolute: 0 10*3/uL (ref 0.0–0.1)
Eosinophils Absolute: 0.1 10*3/uL (ref 0.0–0.7)
Eosinophils Relative: 1.5 % (ref 0.0–5.0)
HCT: 39.8 % (ref 36.0–46.0)
Hemoglobin: 13.1 g/dL (ref 12.0–15.0)
Lymphocytes Relative: 23.1 % (ref 12.0–46.0)
Lymphs Abs: 1.5 10*3/uL (ref 0.7–4.0)
MCHC: 32.9 g/dL (ref 30.0–36.0)
MCV: 89.3 fl (ref 78.0–100.0)
Monocytes Absolute: 0.6 10*3/uL (ref 0.1–1.0)
Monocytes Relative: 8.6 % (ref 3.0–12.0)
NEUTROS PCT: 66.5 % (ref 43.0–77.0)
Neutro Abs: 4.3 10*3/uL (ref 1.4–7.7)
Platelets: 259 10*3/uL (ref 150.0–400.0)
RBC: 4.46 Mil/uL (ref 3.87–5.11)
RDW: 13 % (ref 11.5–14.6)
WBC: 6.4 10*3/uL (ref 4.5–10.5)

## 2013-06-28 LAB — PROTIME-INR
INR: 1.1 ratio — AB (ref 0.8–1.0)
PROTHROMBIN TIME: 11.1 s (ref 10.2–12.4)

## 2013-06-28 LAB — MAGNESIUM: MAGNESIUM: 1.8 mg/dL (ref 1.5–2.5)

## 2013-06-28 LAB — APTT: aPTT: 25.7 s (ref 21.7–28.8)

## 2013-06-28 MED ORDER — BETHANECHOL CHLORIDE 10 MG PO TABS: 20.0000 mg | ORAL_TABLET | Freq: Three times a day (TID) | ORAL | Status: DC

## 2013-06-28 MED ORDER — FLUDROCORTISONE ACETATE 0.1 MG PO TABS: 0.1000 mg | ORAL_TABLET | Freq: Every day | ORAL | Status: DC

## 2013-06-28 MED ORDER — FLUCONAZOLE 150 MG PO TABS: 150.0000 mg | ORAL_TABLET | Freq: Once | ORAL | Status: DC

## 2013-06-28 MED ORDER — PROPRANOLOL HCL 20 MG PO TABS: 20.0000 mg | ORAL_TABLET | Freq: Three times a day (TID) | ORAL | Status: DC

## 2013-06-28 NOTE — Patient Instructions (Signed)
Follow up as needed We'll notify you of your lab results and fax them and the note to your surgeon Call with any needed refills Call with any questions or concerns GOOD LUCK!

## 2013-06-28 NOTE — Progress Notes (Signed)
Subjective:    Jamie Baker is a 43 y.o. female who presents to the office today for a preoperative consultation at the request of surgeon [facility] who plans on performing ICP monitor on February 25. This consultation is requested for the specific conditions prompting preoperative evaluation (i.e. because of potential affect on operative risk): none. Planned anesthesia: general. The patient has the following known anesthesia issues: no hx of anesthesia complications. Patients bleeding risk: no recent abnormal bleeding, no remote history of abnormal bleeding and no use of Ca-channel blockers. Patient does not have objections to receiving blood products if needed.  The following portions of the patient's history were reviewed and updated as appropriate: allergies, current medications, past family history, past medical history, past social history, past surgical history and problem list.  Review of Systems Patient reports no hearing changes, adenopathy,fever, weight change,  persistant/recurrent hoarseness, swallowing issues, chest pain, palpitations, edema, persistant/recurrent cough, hemoptysis, dyspnea (rest/exertional/paroxysmal nocturnal), gastrointestinal bleeding (melena, rectal bleeding), abdominal pain, significant heartburn, bowel changes, GU symptoms (dysuria, hematuria, incontinence), Gyn symptoms (abnormal  bleeding, pain), focal weakness, memory loss, numbness & tingling, skin/hair/nail changes, abnormal bruising or bleeding, anxiety, or depression.  + visual changes in L eye thought to be due to increased CSF pressure + syncope- pt w/ known hx of POTS, last episode was Saturday    Objective:      General Appearance:    Alert, cooperative, no distress, appears stated age  Head:    Normocephalic, without obvious abnormality, atraumatic  Eyes:    PERRL, conjunctiva/corneas clear, EOM's intact, fundi    benign, both eyes  Ears:    Normal TM's and external ear  canals, both ears  Nose:   Nares normal, septum midline, mucosa normal, no drainage    or sinus tenderness  Throat:   Lips, mucosa, and tongue normal; teeth and gums normal  Neck:   Supple, symmetrical, trachea midline, no adenopathy;    Thyroid: no enlargement/tenderness/nodules  Back:     Symmetric, no curvature, ROM normal, no CVA tenderness  Lungs:     Clear to auscultation bilaterally, respirations unlabored  Chest Wall:    No tenderness or deformity   Heart:    Regular rate and rhythm, S1 and S2 normal, no murmur, rub   or gallop  Breast Exam:    Deferred to GYN  Abdomen:     Soft, non-tender, bowel sounds active all four quadrants,    no masses, no organomegaly  Genitalia:    Deferred to GYN  Rectal:    Extremities:   Extremities normal, atraumatic, no cyanosis or edema  Pulses:   2+ and symmetric all extremities  Skin:   Skin color, texture, turgor normal, no rashes or lesions  Lymph nodes:   Cervical, supraclavicular, and axillary nodes normal  Neurologic:   CNII-XII intact, normal strength, sensation and reflexes    throughout     Predictors of intubation difficulty:  Morbid obesity? no  Anatomically abnormal facies? no  Prominent incisors? no  Receding mandible? yes  Short, thick neck? no  Neck range of motion: normal  Dentition: No chipped, loose, or missing teeth.  Cardiographics ECG: not done Echocardiogram: not done  Imaging Chest x-ray: not done   Lab Review  Type and screen, CBC UA, CMP, Mag, PT, PTT, INR pending      Assessment:      43 y.o. female with planned surgery as above.   Known risk factors for perioperative complications: None  Difficulty with intubation is not anticipated.  Cardiac Risk Estimation: low  Current medications which may produce withdrawal symptoms if withheld perioperatively: propranolol (only takes Fludrocortisone when BP drops)      Plan:    1. Preoperative workup as follows labs. 2. Change in medication regimen  before surgery: discontinue ASA 14 days before surgery. 3. Prophylaxis for cardiac events with perioperative beta-blockers: NA, already taking. 4. Invasive hemodynamic monitoring perioperatively: at the discretion of anesthesiologist. 5. Deep vein thrombosis prophylaxis postoperatively:regimen to be chosen by surgical team. 6. Surveillance for postoperative MI with ECG immediately postoperatively and on postoperative days 1 and 2 AND troponin levels 24 hours postoperatively and on day 4 or hospital discharge (whichever comes first): at the discretion of anesthesiologist. 7. Other measures: none

## 2013-06-28 NOTE — Progress Notes (Signed)
Pre visit review using our clinic review tool, if applicable. No additional management support is needed unless otherwise documented below in the visit note. 

## 2013-06-29 MED ORDER — MIDODRINE HCL 10 MG PO TABS: 10.0000 mg | ORAL_TABLET | Freq: Three times a day (TID) | ORAL | Status: DC

## 2013-06-29 NOTE — Telephone Encounter (Signed)
Med filled.  

## 2013-06-30 LAB — ABO AND RH: Rh Type: POSITIVE

## 2013-07-21 ENCOUNTER — Encounter: Payer: Self-pay | Admitting: Family Medicine

## 2013-07-21 ENCOUNTER — Ambulatory Visit (INDEPENDENT_AMBULATORY_CARE_PROVIDER_SITE_OTHER): Payer: BC Managed Care – PPO | Admitting: Family Medicine

## 2013-07-21 VITALS — BP 114/80 | HR 83 | Temp 98.2°F | Resp 16 | Wt 175.2 lb

## 2013-07-21 DIAGNOSIS — R197 Diarrhea, unspecified: Secondary | ICD-10-CM

## 2013-07-21 DIAGNOSIS — R109 Unspecified abdominal pain: Secondary | ICD-10-CM | POA: Insufficient documentation

## 2013-07-21 MED ORDER — PROMETHAZINE HCL 25 MG PO TABS: 25.0000 mg | ORAL_TABLET | Freq: Four times a day (QID) | ORAL | Status: DC | PRN

## 2013-07-21 MED ORDER — DIPHENOXYLATE-ATROPINE 2.5-0.025 MG PO TABS: 2.0000 | ORAL_TABLET | Freq: Four times a day (QID) | ORAL | Status: DC | PRN

## 2013-07-21 NOTE — Assessment & Plan Note (Signed)
New.  Pt was recently hospitalized and may have acquired C diff in the process.  Check labs and stool studies.  Phenergan for nausea, lomotil prn diarrhea.  Reviewed supportive care and red flags that should prompt return.  Pt expressed understanding and is in agreement w/ plan.

## 2013-07-21 NOTE — Patient Instructions (Signed)
We'll notify you of your lab results and make any changes if needed Please complete your stool studies as directed Drink plenty of fluids Take the Phenergan as needed for nausea Use the Lomotil as needed for diarrhea REST! Call with any questions or concerns Hang in there!!!

## 2013-07-21 NOTE — Assessment & Plan Note (Signed)
New.  Band like across central abdomen w/o focality.  Check labs.  Get stool studies.  If no improvement may require imaging.

## 2013-07-21 NOTE — Progress Notes (Signed)
Pre visit review using our clinic review tool, if applicable. No additional management support is needed unless otherwise documented below in the visit note. 

## 2013-07-21 NOTE — Progress Notes (Signed)
   Subjective:    Patient ID: Jamie Baker, female    DOB: Mar 04, 1971, 43 y.o.   MRN: [id number]  HPI abd pain, nausea, and diarrhea since implantation of ICP monitor 2/25 and removed on 2/26.  Since surgery, will 'take 1-2 bites and it goes right through me'.  Pt reports it occurs w/ fluids as well as food.  abd pain is centralized and radiates across abdomen.  Stools are mucousy and 'not normal colors'.   Review of Systems For ROS see HPI     Objective:   Physical Exam  Vitals reviewed. Constitutional: She is oriented to person, place, and time. She appears well-developed and well-nourished. No distress.  HENT:  Head: Normocephalic and atraumatic.  MMM  Neck: Neck supple.  Cardiovascular: Normal rate, regular rhythm and intact distal pulses.   Pulmonary/Chest: Effort normal and breath sounds normal. No respiratory distress. She has no wheezes. She has no rales.  Abdominal: Soft. She exhibits no distension. There is tenderness (across central abdomen). There is no rebound and no guarding.  Hypoactive BS  Lymphadenopathy:    She has no cervical adenopathy.  Neurological: She is alert and oriented to person, place, and time.  Skin: Skin is warm and dry.          Assessment & Plan:

## 2013-07-22 LAB — BASIC METABOLIC PANEL
BUN: 10 mg/dL (ref 6–23)
CALCIUM: 9 mg/dL (ref 8.4–10.5)
CO2: 27 meq/L (ref 19–32)
Chloride: 101 mEq/L (ref 96–112)
Creatinine, Ser: 0.8 mg/dL (ref 0.4–1.2)
GFR: 86.1 mL/min (ref >60.00)
GLUCOSE: 57 mg/dL — AB (ref 70–99)
Potassium: 4.2 mEq/L (ref 3.5–5.1)
Sodium: 137 mEq/L (ref 135–145)

## 2013-07-22 LAB — CBC WITH DIFFERENTIAL/PLATELET
Basophils Absolute: 0 10*3/uL (ref 0.0–0.1)
Basophils Relative: 0.5 % (ref 0.0–3.0)
EOS PCT: 3 % (ref 0.0–5.0)
Eosinophils Absolute: 0.2 10*3/uL (ref 0.0–0.7)
HEMATOCRIT: 40.2 % (ref 36.0–46.0)
Hemoglobin: 13.1 g/dL (ref 12.0–15.0)
LYMPHS ABS: 1.5 10*3/uL (ref 0.7–4.0)
LYMPHS PCT: 24.2 % (ref 12.0–46.0)
MCHC: 32.6 g/dL (ref 30.0–36.0)
MCV: 88.9 fl (ref 78.0–100.0)
MONOS PCT: 9.3 % (ref 3.0–12.0)
Monocytes Absolute: 0.6 10*3/uL (ref 0.1–1.0)
Neutro Abs: 3.9 10*3/uL (ref 1.4–7.7)
Neutrophils Relative %: 63 % (ref 43.0–77.0)
PLATELETS: 245 10*3/uL (ref 150.0–400.0)
RBC: 4.53 Mil/uL (ref 3.87–5.11)
RDW: 13.3 % (ref 11.5–14.6)
WBC: 6.2 10*3/uL (ref 4.5–10.5)

## 2013-07-22 LAB — HEPATIC FUNCTION PANEL
ALT: 23 U/L (ref 0–35)
AST: 21 U/L (ref 0–37)
Albumin: 3.8 g/dL (ref 3.5–5.2)
Alkaline Phosphatase: 64 U/L (ref 39–117)
Bilirubin, Direct: 0.1 mg/dL (ref 0.0–0.3)
Total Bilirubin: 0.5 mg/dL (ref 0.3–1.2)
Total Protein: 7 g/dL (ref 6.0–8.3)

## 2013-07-23 ENCOUNTER — Other Ambulatory Visit: Payer: Self-pay | Admitting: General Practice

## 2013-07-23 LAB — CLOSTRIDIUM DIFFICILE EIA: CDIFTX: POSITIVE

## 2013-07-23 MED ORDER — METRONIDAZOLE 500 MG PO TABS: 500.0000 mg | ORAL_TABLET | Freq: Three times a day (TID) | ORAL | Status: DC

## 2013-07-26 LAB — STOOL CULTURE

## 2013-08-31 ENCOUNTER — Encounter (HOSPITAL_BASED_OUTPATIENT_CLINIC_OR_DEPARTMENT_OTHER): Payer: Self-pay | Admitting: Emergency Medicine

## 2013-08-31 ENCOUNTER — Emergency Department (HOSPITAL_BASED_OUTPATIENT_CLINIC_OR_DEPARTMENT_OTHER): Payer: BC Managed Care – PPO

## 2013-08-31 ENCOUNTER — Emergency Department (HOSPITAL_BASED_OUTPATIENT_CLINIC_OR_DEPARTMENT_OTHER)
Admission: EM | Admit: 2013-08-31 | Discharge: 2013-08-31 | Disposition: A | Discharge status: Home / Self Care | Payer: BC Managed Care – PPO | Attending: Emergency Medicine | Admitting: Emergency Medicine

## 2013-08-31 ENCOUNTER — Telehealth: Payer: Self-pay | Admitting: Family Medicine

## 2013-08-31 DIAGNOSIS — K219 Gastro-esophageal reflux disease without esophagitis: Secondary | ICD-10-CM | POA: Insufficient documentation

## 2013-08-31 DIAGNOSIS — G8929 Other chronic pain: Secondary | ICD-10-CM | POA: Insufficient documentation

## 2013-08-31 DIAGNOSIS — Z7982 Long term (current) use of aspirin: Secondary | ICD-10-CM | POA: Insufficient documentation

## 2013-08-31 DIAGNOSIS — R51 Headache: Secondary | ICD-10-CM

## 2013-08-31 DIAGNOSIS — I951 Orthostatic hypotension: Secondary | ICD-10-CM

## 2013-08-31 DIAGNOSIS — Z792 Long term (current) use of antibiotics: Secondary | ICD-10-CM | POA: Insufficient documentation

## 2013-08-31 DIAGNOSIS — Z8709 Personal history of other diseases of the respiratory system: Secondary | ICD-10-CM | POA: Insufficient documentation

## 2013-08-31 DIAGNOSIS — IMO0002 Reserved for concepts with insufficient information to code with codable children: Secondary | ICD-10-CM | POA: Insufficient documentation

## 2013-08-31 DIAGNOSIS — Z79899 Other long term (current) drug therapy: Secondary | ICD-10-CM | POA: Insufficient documentation

## 2013-08-31 DIAGNOSIS — G90A Postural orthostatic tachycardia syndrome (POTS): Secondary | ICD-10-CM

## 2013-08-31 DIAGNOSIS — Z8619 Personal history of other infectious and parasitic diseases: Secondary | ICD-10-CM | POA: Insufficient documentation

## 2013-08-31 DIAGNOSIS — I498 Other specified cardiac arrhythmias: Secondary | ICD-10-CM | POA: Insufficient documentation

## 2013-08-31 DIAGNOSIS — R000 Tachycardia, unspecified: Secondary | ICD-10-CM

## 2013-08-31 DIAGNOSIS — R519 Headache, unspecified: Secondary | ICD-10-CM

## 2013-08-31 DIAGNOSIS — G43909 Migraine, unspecified, not intractable, without status migrainosus: Secondary | ICD-10-CM | POA: Insufficient documentation

## 2013-08-31 HISTORY — DX: Other specified cardiac arrhythmias: I49.8

## 2013-08-31 HISTORY — DX: Tachycardia, unspecified: R00.0

## 2013-08-31 HISTORY — DX: Postural orthostatic tachycardia syndrome (POTS): G90.A

## 2013-08-31 HISTORY — DX: Orthostatic hypotension: I95.1

## 2013-08-31 LAB — URINALYSIS, ROUTINE W REFLEX MICROSCOPIC
BILIRUBIN URINE: NEGATIVE
Glucose, UA: NEGATIVE mg/dL
Hgb urine dipstick: NEGATIVE
Ketones, ur: NEGATIVE mg/dL
LEUKOCYTES UA: NEGATIVE
NITRITE: NEGATIVE
PROTEIN: NEGATIVE mg/dL
Specific Gravity, Urine: 1.004 — ABNORMAL LOW (ref 1.005–1.030)
Urobilinogen, UA: 0.2 mg/dL (ref 0.0–1.0)
pH: 7 (ref 5.0–8.0)

## 2013-08-31 LAB — BASIC METABOLIC PANEL
BUN: 12 mg/dL (ref 6–23)
CALCIUM: 9.5 mg/dL (ref 8.4–10.5)
CO2: 28 mEq/L (ref 19–32)
CREATININE: 0.8 mg/dL (ref 0.50–1.10)
Chloride: 101 mEq/L (ref 96–112)
GFR, EST NON AFRICAN AMERICAN: 88 mL/min — AB (ref >90)
Glucose, Bld: 91 mg/dL (ref 70–99)
Potassium: 4.1 mEq/L (ref 3.7–5.3)
Sodium: 141 mEq/L (ref 137–147)

## 2013-08-31 LAB — CBC WITH DIFFERENTIAL/PLATELET
BASOS ABS: 0 10*3/uL (ref 0.0–0.1)
BASOS PCT: 0 % (ref 0–1)
EOS ABS: 0.2 10*3/uL (ref 0.0–0.7)
EOS PCT: 3 % (ref 0–5)
HEMATOCRIT: 39.4 % (ref 36.0–46.0)
HEMOGLOBIN: 13.3 g/dL (ref 12.0–15.0)
Lymphocytes Relative: 34 % (ref 12–46)
Lymphs Abs: 1.9 10*3/uL (ref 0.7–4.0)
MCH: 30.3 pg (ref 26.0–34.0)
MCHC: 33.8 g/dL (ref 30.0–36.0)
MCV: 89.7 fL (ref 78.0–100.0)
MONO ABS: 0.7 10*3/uL (ref 0.1–1.0)
MONOS PCT: 12 % (ref 3–12)
Neutro Abs: 2.9 10*3/uL (ref 1.7–7.7)
Neutrophils Relative %: 51 % (ref 43–77)
Platelets: 224 10*3/uL (ref 150–400)
RBC: 4.39 MIL/uL (ref 3.87–5.11)
RDW: 13.1 % (ref 11.5–15.5)
WBC: 5.6 10*3/uL (ref 4.0–10.5)

## 2013-08-31 MED ORDER — METOCLOPRAMIDE HCL 5 MG/ML IJ SOLN
10.0000 mg | Freq: Once | INTRAMUSCULAR | Status: AC
  Administered 2013-08-31: 10 mg via INTRAVENOUS
  Filled 2013-08-31: qty 2

## 2013-08-31 MED ORDER — KETOROLAC TROMETHAMINE 30 MG/ML IJ SOLN
15.0000 mg | Freq: Once | INTRAMUSCULAR | Status: AC
  Administered 2013-08-31: 15 mg via INTRAVENOUS
  Filled 2013-08-31: qty 1

## 2013-08-31 MED ORDER — DIPHENHYDRAMINE HCL 50 MG/ML IJ SOLN
25.0000 mg | Freq: Once | INTRAMUSCULAR | Status: AC
  Administered 2013-08-31: 25 mg via INTRAVENOUS
  Filled 2013-08-31: qty 1

## 2013-08-31 MED ORDER — DEXAMETHASONE SODIUM PHOSPHATE 10 MG/ML IJ SOLN
10.0000 mg | Freq: Once | INTRAMUSCULAR | Status: AC
  Administered 2013-08-31: 10 mg via INTRAVENOUS
  Filled 2013-08-31: qty 1

## 2013-08-31 MED ORDER — SODIUM CHLORIDE 0.9 % IV BOLUS (SEPSIS)
1000.0000 mL | INTRAVENOUS | Status: AC
  Administered 2013-08-31: 1000 mL via INTRAVENOUS

## 2013-08-31 MED ORDER — SODIUM CHLORIDE 0.9 % IV BOLUS (SEPSIS)
1000.0000 mL | Freq: Once | INTRAVENOUS | Status: AC
  Administered 2013-08-31: 1000 mL via INTRAVENOUS

## 2013-08-31 NOTE — Discharge Instructions (Signed)
Postural Orthostatic Tachycardia Syndrome  Postural orthostatic tachycardia syndrome (POTS) is an increased heart rate when going from a lying (supine) position to a standing position. The heart rate may increase more than 30 beats per minute (BPM) above its resting rate when going from a lying to a standing position. POTS occurs more frequently in women than in men.   SYMPTOMS   POTS symptoms may be increased in the morning. Symptoms of POTS include:   Fainting or near fainting.   Inability to think clearly.   Extreme or chronic fatigue.   Exercise intolerance.   Chest pain.   Having the lower legs develop a reddish-blue color due to decreased blood flow (acrocyanosis).  CAUSES  POTS can be caused by different conditions. Sometimes, it has no known cause (idiopathic). Some causes of POTS include:   Viral illness.   Pregnancy.   Autoimmune diseases.   Medications.   Major surgery.   Trauma such as a car accident or major injury.   Medical conditions such as anemia, dehydration, and hyperthyroidism.  DIAGNOSIS   POTS is diagnosed by:   Taking a complete history and physical exam.   Measuring the heart rate while lying and then upon standing.   Measuring blood pressure when going from a lying to a standing position. POTS is usually not associated with low blood pressure (othostatic hypotention) when going from a lying to standing position. While standing, blood pressure should be taken 2, 5, and 10 minutes after getting up.  TREATMENT   Treatment of POTS depends upon the severity of the symptoms. Treatment includes:   Drinking plenty of fluids to avoid getting dehydrated.   Avoiding very hot environments to not get overheated.   Increasing your dietary salt intake as instructed by your caregiver.   Taking different types of medications as prescribed for POTS.   Avoiding some classes of medications such as vasodilators and diuretics.  SEEK IMMEDIATE MEDICAL CARE IF   You have severe chest pain  that does not go away. Call your local emergency service immediately.   You feel your heart racing or beating rapidly.   You feel like passing out.   You have very confused thinking.  MAKE SURE YOU   Understand these instructions.   Will watch your condition.   Will get help right away if you are not doing well or get worse.  Document Released: 04/26/2002 Document Revised: 07/29/2011 Document Reviewed: 07/04/2010  [employer] Patient Information 2014 [employer].

## 2013-08-31 NOTE — ED Notes (Signed)
Hypotension dizziness and headache. States she has POTS and feels she needs IV fluids. She had 3 "drop attacks yesterday" none today.

## 2013-08-31 NOTE — Telephone Encounter (Signed)
4.14.15  Pt is having a hard time keeping BP up and is currently taking RX midodrine (PROAMATINE) 10 MG.  Pt has been dizzy and tired and has no energy and headaches.  Pt wants to know what to do.  Please advise.

## 2013-08-31 NOTE — ED Notes (Signed)
Pt ambulatory to bathroom. States she feels ready to go home.

## 2013-08-31 NOTE — ED Provider Notes (Signed)
CSN: [id number]     Arrival date & time 08/31/13  1400 History   First MD Initiated Contact with Patient 08/31/13 1505     Chief Complaint  Patient presents with  . Hypotension     (Consider location/radiation/quality/duration/timing/severity/associated sxs/prior Treatment) Patient is a 43 y.o. female presenting with weakness. The history is provided by the patient. No language interpreter was used.  Weakness This is a recurrent problem. The current episode started more than 2 days ago (3-4 days og lightheadeness, near syncope, low BP and 3 recent "drop attacks"). The problem occurs constantly. The problem has not changed since onset.Associated symptoms include headaches (chronic, daily, though worse past 3-4 days). Pertinent negatives include no chest pain, no abdominal pain and no shortness of breath. The symptoms are aggravated by standing and exertion. The symptoms are relieved by lying down. Treatments tried: PO hydration, midodrine. The treatment provided no relief.    Past Medical History  Diagnosis Date  . Chiari I malformation   . Chicken pox   . Bronchitis   . Migraine   . Allergy   . GERD (gastroesophageal reflux disease)   . POTS (postural orthostatic tachycardia syndrome)    Past Surgical History  Procedure Laterality Date  . Craniectomy suboccipital w/ cervical laminectomy / chiari    . Abdominal hysterectomy  2011  . Tonsillectomy and adenoidectomy  1975  . Tubal ligation  1989  . Achilles tendon lengthening  2009  . Achilles tendon repair  2010  . Chairi decompasion  2013   Family History  Problem Relation Age of Onset  . Arthritis Mother   . Breast cancer Mother   . Stroke Mother   . Hyperlipidemia Father   . Hypertension Father   . Diabetes Father   . Brain cancer Father   . Breast cancer Maternal Aunt   . Arthritis Maternal Grandmother   . Breast cancer Maternal Grandmother   . Stroke Maternal Grandfather   . Heart disease Maternal Grandfather     History  Substance Use Topics  . Smoking status: Never Smoker   . Smokeless tobacco: Not on file  . Alcohol Use: Yes     Comment: occasionally   OB History   Grav Para Term Preterm Abortions TAB SAB Ect Mult Living                 Review of Systems  Constitutional: Negative for fever, chills, diaphoresis, activity change, appetite change and fatigue.  HENT: Negative for congestion, facial swelling, rhinorrhea and sore throat.   Eyes: Negative for photophobia and discharge.  Respiratory: Negative for cough, chest tightness and shortness of breath.   Cardiovascular: Negative for chest pain, palpitations and leg swelling.  Gastrointestinal: Negative for nausea, vomiting, abdominal pain and diarrhea.  Endocrine: Negative for polydipsia and polyuria.  Genitourinary: Negative for dysuria, frequency, difficulty urinating and pelvic pain.  Musculoskeletal: Negative for arthralgias, back pain, neck pain and neck stiffness.  Skin: Negative for color change and wound.  Allergic/Immunologic: Negative for immunocompromised state.  Neurological: Positive for weakness, light-headedness and headaches (chronic, daily, though worse past 3-4 days). Negative for facial asymmetry and numbness.       3 "drop attacks"  Hematological: Does not bruise/bleed easily.  Psychiatric/Behavioral: Negative for confusion and agitation.      Allergies  Levaquin  Home Medications   Prior to Admission medications   Medication Sig Start Date End Date Taking? Authorizing Provider  acetaZOLAMIDE (DIAMOX) 250 MG tablet Take 250 mg by mouth 2 (  two) times daily.    Historical Provider, MD  aspirin EC 325 MG tablet Take 325 mg by mouth 3 (three) times daily.    Historical Provider, MD  bethanechol (URECHOLINE) 10 MG tablet Take 2 tablets (20 mg total) by mouth 3 (three) times daily. 06/28/13   Midge Minium, MD  cetirizine (ZYRTEC) 10 MG tablet Take 10 mg by mouth daily.    Historical Provider, MD  darifenacin  (ENABLEX) 7.5 MG 24 hr tablet Take 7.5 mg by mouth daily.    Historical Provider, MD  diphenoxylate-atropine (LOMOTIL) 2.5-0.025 MG per tablet Take 2 tablets by mouth 4 (four) times daily as needed for diarrhea or loose stools. 07/21/13   Midge Minium, MD  fludrocortisone (FLORINEF) 0.1 MG tablet Take 1 tablet (0.1 mg total) by mouth daily. 06/28/13   Midge Minium, MD  hydrocortisone (CORTEF) 10 MG tablet Take 10 mg by mouth daily.    Historical Provider, MD  metroNIDAZOLE (FLAGYL) 500 MG tablet Take 1 tablet (500 mg total) by mouth 3 (three) times daily. 07/23/13   Midge Minium, MD  midodrine (PROAMATINE) 10 MG tablet Take 1 tablet (10 mg total) by mouth 3 (three) times daily. 06/29/13   Midge Minium, MD  Probiotic Product (ALIGN PO) Take by mouth.    Historical Provider, MD  promethazine (PHENERGAN) 25 MG tablet Take 1 tablet (25 mg total) by mouth every 6 (six) hours as needed for nausea or vomiting. 07/21/13   Midge Minium, MD  propranolol (INDERAL) 20 MG tablet Take 1 tablet (20 mg total) by mouth 3 (three) times daily. 06/28/13   Midge Minium, MD  ranitidine (ZANTAC) 150 MG tablet Take 150 mg by mouth daily.    Historical Provider, MD  tamsulosin (FLOMAX) 0.4 MG CAPS Take 1 capsule by mouth daily. 09/25/12   Historical Provider, MD   BP 103/62  Pulse 65  Temp(Src) 98.1 F (36.7 C) (Oral)  Resp 20  Ht 5\' 4"  (1.626 m)  Wt 175 lb (79.379 kg)  BMI 30.02 kg/m2  SpO2 97% Physical Exam  Constitutional: She is oriented to person, place, and time. She appears well-developed and well-nourished. No distress.  HENT:  Head: Normocephalic and atraumatic.  Mouth/Throat: No oropharyngeal exudate.  Eyes: Pupils are equal, round, and reactive to light.  Neck: Normal range of motion. Neck supple.  Cardiovascular: Normal rate, regular rhythm and normal heart sounds.  Exam reveals no gallop and no friction rub.   No murmur heard. Pulmonary/Chest: Effort normal and breath sounds  normal. No respiratory distress. She has no wheezes. She has no rales.  Abdominal: Soft. Bowel sounds are normal. She exhibits no distension and no mass. There is no tenderness. There is no rebound and no guarding.  Musculoskeletal: Normal range of motion. She exhibits no edema and no tenderness.  Neurological: She is alert and oriented to person, place, and time. She has normal strength. She displays no tremor. No cranial nerve deficit or sensory deficit. She exhibits normal muscle tone. She displays a negative Romberg sign. Coordination normal. GCS eye subscore is 4. GCS verbal subscore is 5. GCS motor subscore is 6.  Lightheaded when sitting up  Skin: Skin is warm and dry.  Psychiatric: She has a normal mood and affect.    ED Course  Procedures (including critical care time) Labs Review Labs Reviewed  BASIC METABOLIC PANEL - Abnormal; Notable for the following:    GFR calc non Af Amer 88 (*)  All other components within normal limits  URINALYSIS, ROUTINE W REFLEX MICROSCOPIC - Abnormal; Notable for the following:    Specific Gravity, Urine 1.004 (*)    All other components within normal limits  CBC WITH DIFFERENTIAL    Imaging Review Dg Chest 2 View  08/31/2013   CLINICAL DATA:  Hypotension.  Dizziness and headache.  EXAM: CHEST  2 VIEW  COMPARISON:  DG CHEST 2 VIEW dated 01/02/2009  FINDINGS: The heart size and mediastinal contours are within normal limits. Both lungs are clear. The visualized skeletal structures are unremarkable.  Lower cervical plate and screw fixator noted.  IMPRESSION: No active cardiopulmonary disease.   Electronically Signed   By: Sherryl Barters M.D.   On: 08/31/2013 18:51     EKG Interpretation   Date/Time:  Tuesday August 31 2013 18:22:06 EDT Ventricular Rate:  57 PR Interval:  120 QRS Duration: 84 QT Interval:  448 QTC Calculation: 436 R Axis:   65 Text Interpretation:  Sinus bradycardia Low voltage QRS Borderline ECG No  prior for comparison  Confirmed by Avrian Delfavero  MD, Gisela Lea ([id number]) on 08/31/2013  6:34:27 PM      MDM   Final diagnoses:  POTS (postural orthostatic tachycardia syndrome)  Headache    Pt is a 43 y.o. female with Pmhx as above who presents with about 3-4 days of lightheadedness, near syncope and low BP. She also reports daily h/a's, but worse past several days. She has hx of similar w/ her POTS disease, but symptoms do not normally last this long. She reports hx of "drop attacks" that husband reports have been diagnosed a pseudoseizures.  She had 3 of such episodes recently. On pt, BP borderline. No focal neuro findings, able to ambulated w/o difficulty. No infectious s/sx elicited on HPI or PE.   18:08 Pt feeling about the same, roughly 750cc NS infused. Will add toradol. UA, CBC, BMP grossly unremarkable.  CBC, BMP, UA, CXR unremarkable. EKG w/ sinus brady likely due ot home propranolol.   Pt feeling improved after IVF.  BP stable. Will d/c home with instructions for continued PO hydration, and close f/u with PCP.  Return precautions given for new or worsening symptoms.I believe symptoms are related to POTS syndrome.        Neta Ehlers, MD 09/01/13 [phone]

## 2013-08-31 NOTE — Telephone Encounter (Signed)
Spoke with patient. Yesterday her BP was 95/63, HR 53.  Today her BP is 91/65, HR 52.  She c/o dizziness, fatigue, lethargy, headaches and blurry vision x 2 days. According to the patient, her blood pressures typically run low, but not in the 90's.  She denies N/V/D or any other accompanying symptoms.  States she has been drinking adequate fluids, but just feels weak. Patient was encourage to have someone drive her to the ER.  She stated understanding and agreed with plan.  No further questions or concerns voiced.

## 2013-09-07 ENCOUNTER — Encounter: Payer: Self-pay | Admitting: Internal Medicine

## 2013-09-07 ENCOUNTER — Ambulatory Visit (INDEPENDENT_AMBULATORY_CARE_PROVIDER_SITE_OTHER): Payer: BC Managed Care – PPO | Admitting: Internal Medicine

## 2013-09-07 ENCOUNTER — Telehealth: Payer: Self-pay

## 2013-09-07 ENCOUNTER — Ambulatory Visit: Payer: BC Managed Care – PPO | Admitting: Internal Medicine

## 2013-09-07 VITALS — BP 106/66 | HR 78 | Temp 98.3°F | Wt 177.0 lb

## 2013-09-07 DIAGNOSIS — I498 Other specified cardiac arrhythmias: Secondary | ICD-10-CM

## 2013-09-07 DIAGNOSIS — R000 Tachycardia, unspecified: Secondary | ICD-10-CM

## 2013-09-07 DIAGNOSIS — G90A Postural orthostatic tachycardia syndrome (POTS): Secondary | ICD-10-CM

## 2013-09-07 DIAGNOSIS — K625 Hemorrhage of anus and rectum: Secondary | ICD-10-CM

## 2013-09-07 DIAGNOSIS — I951 Orthostatic hypotension: Secondary | ICD-10-CM

## 2013-09-07 NOTE — Telephone Encounter (Signed)
Called patient back.  Dr. Virgil Benedict recommendations discussed with patient.  Appointment scheduled today for Dr. Larose Kells at 1 pm.

## 2013-09-07 NOTE — Telephone Encounter (Signed)
Patient called requesting an appointment.  For the past week patient has been intermittently passing bright red blood in her stool.  She is unsure of the amount, but states that her stool is covered in blood and whenever she wipes the tissue is also covered in blood.  Denies hx of hemorrhoids or previous history of GI issues.  Patient states that the blood in stool is new.  She has a hx of POTS and blood pressures typically runs 110/80 with medications.  However, lately her blood pressures have been running in the 90's/70s and has been experiencing dizziness.  She recently went to the ED on 08/31/13 for POTS and Headaches.  She was treated and sent home.  Patient states that the bleeding started after she was discharged.  She has been staying hydrated with water and Gatorade and has been monitoring her blood pressure throughout the week.  Her blood pressure this morning was 99/74, 61.   Patient was strongly advised to go to the ER, but patient was really concerned about the cost.  She was again advised to go to the ER, but stated she would rather come in to be seen by a provider here.  Patient was told that the possibility is that she may come here and then be advised to go to the ER, then two charges would be incurred.  The other option given to the patient, was for the patient to go to Urgent Care, at least there she could receive IVFs if needed.  Patient said, thank you.  I am not sure what to do and that she would call back with her decision.  Patient highly encouraged to call us back as she needs to be seen either way.

## 2013-09-07 NOTE — Telephone Encounter (Signed)
There are a few things that need to happen- 1) pt needs a CBC b/c she started bleeding after the labs obtained in ER 2) needs to be examined to r/o hemorrhoid or other 'simple' cause of rectal bleeding 3) needs to have BP checked in clinical setting and get orthostatics 4) agree that she needs to be seen ASAP- if not able to accomodate in this office agree w/ UC where fluids are possibility.  Can understand her reluctance to go to ER due to cost and this may be avoidable if pt is hemodynamically stable (vitals looks ok and CBC doesn't show anemia)  PLEASE SCHEDULE PT for today or advise UC

## 2013-09-07 NOTE — Progress Notes (Signed)
Pre visit review using our clinic review tool, if applicable. No additional management support is needed unless otherwise documented below in the visit note. 

## 2013-09-07 NOTE — Progress Notes (Signed)
Subjective:    Patient ID: Jamie Baker, female    DOB: [birth year], 43 y.o.   MRN: [id number]  DOS:  09/07/2013 Type of  visit: Acute visit One-week history of fresh, red blood per rectum, usually with bowel movements but sometimes without them. The blood is around  and sometimes mixed with the stools and sometimes on the toilette paper. In addition she feels is slightly bloated and sore in the mid abdomen.  Her recent medical history includes C. Difficile diarrhea 07-2013, status post flagyl, become asymptomatic shortly after treatment. Also went to the ER 08-31-13 w/  weakness, low blood pressure, she got IV fluids and felt better. CBC, BMP, UA and EKG were negative.    ROS No fever, some chills? Mild nausea, no vomiting, no diarrhea, actually the stools are formed. Some decrease in appetite. No mucus in the stools. Denies any rectal pain or itching. No dysuria, gross hematuria, difficulty urinating or urinary frequency.   Past Medical History  Diagnosis Date  . Chiari I malformation   . Chicken pox   . Bronchitis   . Migraine   . Allergy   . GERD (gastroesophageal reflux disease)   . POTS (postural orthostatic tachycardia syndrome)     Past Surgical History  Procedure Laterality Date  . Craniectomy suboccipital w/ cervical laminectomy / chiari    . Abdominal hysterectomy  2011  . Tonsillectomy and adenoidectomy  1975  . Tubal ligation  1989  . Achilles tendon lengthening  2009  . Achilles tendon repair  2010  . Chairi decompasion  2013    History   Social History  . Marital Status: Married    Spouse Name: N/A    Number of Children: N/A  . Years of Education: N/A   Occupational History  . Not on file.   Social History Main Topics  . Smoking status: Never Smoker   . Smokeless tobacco: Not on file  . Alcohol Use: Yes     Comment: occasionally  . Drug Use: No  . Sexual Activity: Not on file   Other Topics Concern  . Not on file   Social History  Narrative  . No narrative on file        Medication List       This list is accurate as of: 09/07/13 10:10 PM.  Always use your most recent med list.               acetaZOLAMIDE 250 MG tablet  Commonly known as:  DIAMOX  Take 250 mg by mouth 2 (two) times daily.     ALIGN PO  Take by mouth.     aspirin EC 325 MG tablet  Take 325 mg by mouth 3 (three) times daily.     cetirizine 10 MG tablet  Commonly known as:  ZYRTEC  Take 10 mg by mouth daily.     fludrocortisone 0.1 MG tablet  Commonly known as:  FLORINEF  Take 1 tablet (0.1 mg total) by mouth daily.     HYDROmorphone 2 MG tablet  Commonly known as:  DILAUDID     midodrine 10 MG tablet  Commonly known as:  PROAMATINE  Take 1 tablet (10 mg total) by mouth 3 (three) times daily.     ondansetron 4 MG tablet  Commonly known as:  ZOFRAN     promethazine 25 MG tablet  Commonly known as:  PHENERGAN  Take 1 tablet (25 mg total) by mouth every 6 (six) hours as needed  for nausea or vomiting.     propranolol 20 MG tablet  Commonly known as:  INDERAL  Take 1 tablet (20 mg total) by mouth 3 (three) times daily.     ranitidine 150 MG tablet  Commonly known as:  ZANTAC  Take 150 mg by mouth daily.           Objective:   Physical Exam  Abdominal:     BP 106/66  Pulse 78  Temp(Src) 98.3 F (36.8 C)  Wt 177 lb (80.287 kg)  SpO2 100% + orthostatic VS General -- alert, well-developed, NAD.   HEENT-- Not pale.  Lungs -- normal respiratory effort, no intercostal retractions, no accessory muscle use, and normal breath sounds.  Heart-- normal rate, regular rhythm, no murmur.  Abdomen-- Not distended, slt increased  bowel sounds,soft, slt tender see graphic.  Rectal-- No external abnormalities noted. Normal sphincter tone. No rectal masses or tenderness. Brown stool, small amount of pink mucus Anoscopy: + int hemorrhoids, small, no inflamated  Extremities-- no pretibial edema bilaterally  Neurologic--  alert &  oriented X3. Speech normal, gait normal, strength normal in all extremities.  Psych-- Cognition and judgment appear intact. Cooperative with normal attention span and concentration. No anxious or depressed appearing.        Assessment & Plan:    Blood per rectum, 43 year old lady presents with mild abdominal discomfort, blood per rectum, on exam she is mildly tender in the abdomen, she does have some pink mucous. She was recently treated for C. Difficile infection has no more diarrhea. Plan: CBC, iron and ferritin Stool studies GI referral, colitis? Colonoscopy?  POTS, Slightly orthostatic today, doubt related to rectal bleeding. Likely related to POTS. Recommend to see PCP in 2 weeks

## 2013-09-07 NOTE — Patient Instructions (Signed)
Get your blood work before you leave  Also get containers for stool studies: C. Difficile, WBCs, culture--- dx  blood per rectum  Come back and see your PCP in 2 weeks

## 2013-09-08 ENCOUNTER — Other Ambulatory Visit: Payer: Self-pay | Admitting: Internal Medicine

## 2013-09-08 ENCOUNTER — Ambulatory Visit (INDEPENDENT_AMBULATORY_CARE_PROVIDER_SITE_OTHER): Payer: BC Managed Care – PPO | Admitting: Gastroenterology

## 2013-09-08 ENCOUNTER — Encounter: Payer: Self-pay | Admitting: Gastroenterology

## 2013-09-08 ENCOUNTER — Other Ambulatory Visit (INDEPENDENT_AMBULATORY_CARE_PROVIDER_SITE_OTHER): Payer: BC Managed Care – PPO

## 2013-09-08 VITALS — BP 110/70 | HR 68 | Ht 64.0 in | Wt 175.4 lb

## 2013-09-08 DIAGNOSIS — K625 Hemorrhage of anus and rectum: Secondary | ICD-10-CM

## 2013-09-08 DIAGNOSIS — R079 Chest pain, unspecified: Secondary | ICD-10-CM

## 2013-09-08 DIAGNOSIS — K219 Gastro-esophageal reflux disease without esophagitis: Secondary | ICD-10-CM

## 2013-09-08 DIAGNOSIS — K921 Melena: Secondary | ICD-10-CM

## 2013-09-08 LAB — CBC WITH DIFFERENTIAL/PLATELET
BASOS PCT: 1.8 % (ref 0.0–3.0)
Basophils Absolute: 0.1 10*3/uL (ref 0.0–0.1)
EOS ABS: 0.1 10*3/uL (ref 0.0–0.7)
Eosinophils Relative: 1.7 % (ref 0.0–5.0)
HEMATOCRIT: 39.2 % (ref 36.0–46.0)
HEMOGLOBIN: 13.2 g/dL (ref 12.0–15.0)
LYMPHS ABS: 1.7 10*3/uL (ref 0.7–4.0)
LYMPHS PCT: 26.7 % (ref 12.0–46.0)
MCHC: 33.7 g/dL (ref 30.0–36.0)
MCV: 88 fl (ref 78.0–100.0)
MONO ABS: 0.6 10*3/uL (ref 0.1–1.0)
Monocytes Relative: 9.4 % (ref 3.0–12.0)
NEUTROS ABS: 3.8 10*3/uL (ref 1.4–7.7)
Neutrophils Relative %: 60.4 % (ref 43.0–77.0)
Platelets: 239 10*3/uL (ref 150.0–400.0)
RBC: 4.45 Mil/uL (ref 3.87–5.11)
RDW: 13.9 % (ref 11.5–14.6)
WBC: 6.2 10*3/uL (ref 4.5–10.5)

## 2013-09-08 LAB — FERRITIN: Ferritin: 53.4 ng/mL (ref 10.0–291.0)

## 2013-09-08 LAB — IRON: Iron: 78 ug/dL (ref 42–145)

## 2013-09-08 MED ORDER — PANTOPRAZOLE SODIUM 40 MG PO TBEC: 40.0000 mg | DELAYED_RELEASE_TABLET | Freq: Two times a day (BID) | ORAL | Status: DC

## 2013-09-08 MED ORDER — PEG-KCL-NACL-NASULF-NA ASC-C 100 G PO SOLR
1.0000 | Freq: Once | ORAL | Status: DC
Start: 2013-09-08 — End: 2013-10-19

## 2013-09-08 NOTE — Progress Notes (Signed)
    History of Present Illness: This is a 43 year old female accompanied by her mother. She has a long history of 3-4 loose urgent bowel movements per day. She recently developed C. difficile diarrhea and was treated with a course of metronidazole and the diarrhea resolved. She is now having 1-2 more formed bowel movements per day and small amounts of bright red blood per rectum. She was evaluated by Dr. Larose Kells yesterday and anoscopy revealed small internal hemorrhoids. She has chronic complaints of chest pain and states she notes belching and heartburn as well. She states she's taking Nexium for 14 days on a few occasions without any change in symptoms. Denies weight loss, abdominal pain, constipation, diarrhea, change in stool caliber, melena, nausea, vomiting, dysphagia.  Review of Systems: Pertinent positive and negative review of systems were noted in the above HPI section. All other review of systems were otherwise negative.  Current Medications, Allergies, Past Medical History, Past Surgical History, Family History and Social History were reviewed in [employer] record.  Physical Exam: General: Well developed , well nourished, no acute distress Head: Normocephalic and atraumatic Eyes:  sclerae anicteric, EOMI Ears: Normal auditory acuity Mouth: No deformity or lesions Neck: Supple, no masses or thyromegaly Lungs: Clear throughout to auscultation Heart: Regular rate and rhythm; no murmurs, rubs or bruits Abdomen: Soft, non tender and non distended. No masses, hepatosplenomegaly or hernias noted. Normal Bowel sounds Rectal: Deferred to colonoscopy Musculoskeletal: Symmetrical with no gross deformities  Skin: No lesions on visible extremities Pulses:  Normal pulses noted Extremities: No clubbing, cyanosis, edema or deformities noted Neurological: Alert oriented x 4, grossly nonfocal Cervical Nodes:  No significant cervical adenopathy Inguinal Nodes: No significant  inguinal adenopathy Psychological:  Alert and cooperative. Normal mood and affect  Assessment and Recommendations:  1. Hematochezia. Possible hemorrhoidal bleeding. Rule out colorectal neoplasms and other disorders. Begin Anusol HC suppositories daily. Schedule colonoscopy. The risks, benefits, and alternatives to colonoscopy with possible biopsy and possible polypectomy were discussed with the patient and they consent to proceed.   2. Chronic chest pain. One component may be related to GERD. Concern there may be other factors. Begin Dexilant 60 mg daily samples and then pantoprazole 40 mg twice daily. Follow all standard antireflux measures. Schedule EGD. The risks, benefits, and alternatives to endoscopy with possible biopsy and possible dilation were discussed with the patient and they consent to proceed.

## 2013-09-08 NOTE — Patient Instructions (Addendum)
Start the Dexilant samples one tablet by mouth once daily. Once the samples are finished then pick up the prescription for pantoprazole twice daily to take long-term.  Patient advised to avoid spicy, acidic, citrus, chocolate, mints, fruit and fruit juices.  Limit the intake of caffeine, alcohol and Soda.  Don't exercise too soon after eating.  Don't lie down within 3-4 hours of eating.  Elevate the head of your bed.  You have been scheduled for an endoscopy and colonoscopy with propofol. Please follow the written instructions given to you at your visit today. Please pick up your prep at the pharmacy within the next 1-3 days. If you use inhalers (even only as needed), please bring them with you on the day of your procedure. Your physician has requested that you go to www.startemmi.com and enter the access code given to you at your visit today. This web site gives a general overview about your procedure. However, you should still follow specific instructions given to you by our office regarding your preparation for the procedure.  Thank you for choosing me and [facility].  Pricilla Riffle. Dagoberto Ligas., MD., [facility]

## 2013-09-09 LAB — C. DIFFICILE GDH AND TOXIN A/B
C. DIFF TOXIN A/B: NOT DETECTED
C. difficile GDH: DETECTED — AB

## 2013-09-09 LAB — CLOSTRIDIUM DIFFICILE BY PCR: Toxigenic C. Difficile by PCR: DETECTED — CR

## 2013-09-11 LAB — FECAL LACTOFERRIN, QUANT: Lactoferrin: NEGATIVE

## 2013-09-12 LAB — STOOL CULTURE

## 2013-09-13 ENCOUNTER — Telehealth: Payer: Self-pay | Admitting: Internal Medicine

## 2013-09-13 MED ORDER — VANCOMYCIN HCL 125 MG PO CAPS: 125.0000 mg | ORAL_CAPSULE | Freq: Four times a day (QID) | ORAL | Status: DC

## 2013-09-13 NOTE — Telephone Encounter (Signed)
advise patient Stool studies showed C. Difficile again Plan: I sent a prescription for vancomycin to take orally Needs to take a OTC probiotic --->  Florastor 1 bid for 1 month.  RTC to see PCP in 1-2 weeks

## 2013-09-13 NOTE — Telephone Encounter (Signed)
Left detailed voicemail

## 2013-09-15 ENCOUNTER — Encounter: Payer: Self-pay | Admitting: Gastroenterology

## 2013-09-20 ENCOUNTER — Telehealth: Payer: Self-pay | Admitting: Internal Medicine

## 2013-09-20 NOTE — Telephone Encounter (Signed)
Please check on pt, was rx vancomycin po for Cdiff, better? Let me know

## 2013-09-21 NOTE — Telephone Encounter (Signed)
Patient states that she is doing a little better. No bleeding at this time. Not as blotted as she was the last time she had C-diff She has gone to GI and they will be performing a colonoscopy and endoscopy shortly.  Advised to let us know if we can help in any way.

## 2013-09-21 NOTE — Telephone Encounter (Signed)
thx

## 2013-10-19 ENCOUNTER — Ambulatory Visit (AMBULATORY_SURGERY_CENTER): Payer: BC Managed Care – PPO | Admitting: Gastroenterology

## 2013-10-19 ENCOUNTER — Encounter: Payer: Self-pay | Admitting: Gastroenterology

## 2013-10-19 VITALS — BP 104/68 | HR 56 | Temp 97.3°F | Resp 20 | Ht 64.0 in | Wt 175.0 lb

## 2013-10-19 DIAGNOSIS — K921 Melena: Secondary | ICD-10-CM

## 2013-10-19 DIAGNOSIS — K219 Gastro-esophageal reflux disease without esophagitis: Secondary | ICD-10-CM

## 2013-10-19 DIAGNOSIS — R079 Chest pain, unspecified: Secondary | ICD-10-CM

## 2013-10-19 MED ORDER — SODIUM CHLORIDE 0.9 % IV SOLN: 500.0000 mL | INTRAVENOUS | Status: DC

## 2013-10-19 NOTE — Progress Notes (Signed)
A/ox3, pleased with MAC, report to RN 

## 2013-10-19 NOTE — Op Note (Signed)
[place]  [address]. [place], [id number]   COLONOSCOPY PROCEDURE REPORT  PATIENT: Jamie Baker, Jamie Baker  MR#: [id number] BIRTHDATE: 03/12/1971 , 10  yrs. old GENDER: Female ENDOSCOPIST: Ladene Artist, MD, [facility] REFERRED PO:[id number] Wadie Lessen, M.D. PROCEDURE DATE:  10/19/2013 PROCEDURE:   Colonoscopy, diagnostic First Screening Colonoscopy - Avg.  risk and is 50 yrs.  old or older - No.  Prior Negative Screening - Now for repeat screening. N/A  History of Adenoma - Now for follow-up colonoscopy & has been > or = to 3 yrs.  N/A  Polyps Removed Today? No.  Recommend repeat exam, <10 yrs? No. ASA CLASS:   Class III INDICATIONS:hematochezia. MEDICATIONS: MAC sedation, administered by CRNA and propofol (Diprivan) 240mg  IV DESCRIPTION OF PROCEDURE:   After the risks benefits and alternatives of the procedure were thoroughly explained, informed consent was obtained.  A digital rectal exam revealed no abnormalities of the rectum.   The [id number]  endoscope was introduced through the anus and advanced to the cecum, which was identified by both the appendix and ileocecal valve. No adverse events experienced.   The quality of the prep was excellent, using MoviPrep  The instrument was then slowly withdrawn as the colon was fully examined.  COLON FINDINGS: A normal appearing cecum, ileocecal valve, and appendiceal orifice were identified.  The ascending, hepatic flexure, transverse, splenic flexure, descending, sigmoid colon and rectum appeared unremarkable.  No polyps or cancers were seen. Retroflexed views revealed small internal hemorrhoids. The time to cecum=2 minutes 27 seconds.  Withdrawal time=8 minutes 52 seconds. The scope was withdrawn and the procedure completed.  COMPLICATIONS: There were no complications.  ENDOSCOPIC IMPRESSION: 1.  Normal colon 2.  Small internal hemorrhoids  RECOMMENDATIONS: 1.  Continue to follow colorectal cancer screening  guidelines for "routine risk" patients with a repeat colonoscopy in 10 years. There is no need for routine, screening FOBT (stool) testing for at least 5 years.  eSigned:  Ladene Artist, MD, [facility] 10/19/2013 2:58 PM

## 2013-10-19 NOTE — Op Note (Signed)
[place]  [employer] [address] [place], [id number]   ENDOSCOPY PROCEDURE REPORT  PATIENT: Jamie, Baker  MR#: [id number] BIRTHDATE: 06/08/1970 , 40  yrs. old GENDER: Female ENDOSCOPIST: Ladene Artist, MD, [facility] REFERRED BY:  Midge Minium, M.D. PROCEDURE DATE:  10/19/2013 PROCEDURE:  EGD, diagnostic ASA CLASS:     Class III INDICATIONS:  Chest pain.   History of esophageal reflux. MEDICATIONS: MAC sedation, administered by CRNA, There was residual sedation effect present from prior procedure, and propofol (Diprivan) 80mg  IV TOPICAL ANESTHETIC: none DESCRIPTION OF PROCEDURE: After the risks benefits and alternatives of the procedure were thoroughly explained, informed consent was obtained.  The LB [id number] endoscope was introduced through the mouth and advanced to the second portion of the duodenum. Without limitations.  The instrument was slowly withdrawn as the mucosa was fully examined.  ESOPHAGUS: The mucosa of the esophagus appeared normal. STOMACH: The mucosa and folds of the stomach appeared normal. DUODENUM: The duodenal mucosa showed no abnormalities in the bulb and second portion of the duodenum.  Retroflexed views revealed a small hiatal hernia.  The scope was then withdrawn from the patient and the procedure completed.  COMPLICATIONS: There were no complications.  ENDOSCOPIC IMPRESSION: 1.   Small hiatal hernia 2.   The EGD otherwise appeared normal  RECOMMENDATIONS: 1.  Anti-reflux regimen 2.  Continue PPI  eSigned:  Ladene Artist, MD, [facility] 10/19/2013 3:05 PM

## 2013-10-19 NOTE — Patient Instructions (Signed)
YOU HAD AN ENDOSCOPIC PROCEDURE TODAY AT THE [facility]: Refer to the procedure report that was given to you for any specific questions about what was found during the examination.  If the procedure report does not answer your questions, please call your gastroenterologist to clarify.  If you requested that your care partner not be given the details of your procedure findings, then the procedure report has been included in a sealed envelope for you to review at your convenience later.  YOU SHOULD EXPECT: Some feelings of bloating in the abdomen. Passage of more gas than usual.  Walking can help get rid of the air that was put into your GI tract during the procedure and reduce the bloating. If you had a lower endoscopy (such as a colonoscopy or flexible sigmoidoscopy) you may notice spotting of blood in your stool or on the toilet paper. If you underwent a bowel prep for your procedure, then you may not have a normal bowel movement for a few days.  DIET: Your first meal following the procedure should be a light meal and then it is ok to progress to your normal diet.  A half-sandwich or bowl of soup is an example of a good first meal.  Heavy or fried foods are harder to digest and may make you feel nauseous or bloated.  Likewise meals heavy in dairy and vegetables can cause extra gas to form and this can also increase the bloating.  Drink plenty of fluids but you should avoid alcoholic beverages for 24 hours.  ACTIVITY: Your care partner should take you home directly after the procedure.  You should plan to take it easy, moving slowly for the rest of the day.  You can resume normal activity the day after the procedure however you should NOT DRIVE or use heavy machinery for 24 hours (because of the sedation medicines used during the test).    SYMPTOMS TO REPORT IMMEDIATELY: A gastroenterologist can be reached at any hour.  During normal business hours, 8:30 AM to 5:00 PM Monday through Friday,  call [phone].  After hours and on weekends, please call the GI answering service at [phone] who will take a message and have the physician on call contact you.   Following lower endoscopy (colonoscopy or flexible sigmoidoscopy):  Excessive amounts of blood in the stool  Significant tenderness or worsening of abdominal pains  Swelling of the abdomen that is new, acute  Fever of 100F or higher  Following upper endoscopy (EGD)  Vomiting of blood or coffee ground material  New chest pain or pain under the shoulder blades  Painful or persistently difficult swallowing  New shortness of breath  Fever of 100F or higher  Black, tarry-looking stools  FOLLOW UP: If any biopsies were taken you will be contacted by phone or by letter within the next 1-3 weeks.  Call your gastroenterologist if you have not heard about the biopsies in 3 weeks.  Our staff will call the home number listed on your records the next business day following your procedure to check on you and address any questions or concerns that you may have at that time regarding the information given to you following your procedure. This is a courtesy call and so if there is no answer at the home number and we have not heard from you through the emergency physician on call, we will assume that you have returned to your regular daily activities without incident.  SIGNATURES/CONFIDENTIALITY: You and/or your care   partner have signed paperwork which will be entered into your electronic medical record.  These signatures attest to the fact that that the information above on your After Visit Summary has been reviewed and is understood.  Full responsibility of the confidentiality of this discharge information lies with you and/or your care-partner.   Resume medications. Information given on hemorrhoids,high fiber diet and hiatal hernia with discharge instructions.

## 2013-10-20 ENCOUNTER — Telehealth: Payer: Self-pay | Admitting: *Deleted

## 2013-10-20 NOTE — Telephone Encounter (Signed)
  Follow up Call-  Call back number 10/19/2013  Post procedure Call Back phone  # [phone]  Permission to leave phone message Yes    [facility]

## 2013-10-26 DIAGNOSIS — N319 Neuromuscular dysfunction of bladder, unspecified: Secondary | ICD-10-CM | POA: Insufficient documentation

## 2013-10-26 DIAGNOSIS — I6782 Cerebral ischemia: Secondary | ICD-10-CM | POA: Insufficient documentation

## 2013-10-26 DIAGNOSIS — Q675 Congenital deformity of spine: Secondary | ICD-10-CM | POA: Insufficient documentation

## 2013-10-26 DIAGNOSIS — G939 Disorder of brain, unspecified: Secondary | ICD-10-CM | POA: Insufficient documentation

## 2013-11-01 ENCOUNTER — Telehealth: Payer: Self-pay

## 2013-11-01 NOTE — Telephone Encounter (Signed)
Admitted to [facility] (ER to Admission):  10/26/13  Discharge to Home:  10/27/13  Medication and allergies:  Reviewed and updated   Local pharmacy:  [id number] - [place], [place] - [address] AT [place], family history and past surgical hx: Reviewed and updated  Since discharge pt states that she has not been doing well.  Continues to have headaches that radiate to left side of face.  Lost of vision of left eye with radiating pain. BPs have been ranging from 98/60 to 116/80.  She monitors BPs at work.  Periodic episode of dizziness.  She is working.  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  At work today.  Remains independent of ADLs most days.  Husband has to help with dressing and showering on days in which she is dizzy.  He also provides transportation for her.  No occupation therapy at this time. She taking medications as prescribed.  Per patient, she has an appointment with Chiari specialist on Wednesday.  Hospital follow up appointment with Dr. Birdie Riddle scheduled for Tues., June 16th at 11:30 am.  Mother will drive her to her appointment.

## 2013-11-02 ENCOUNTER — Encounter: Payer: Self-pay | Admitting: Family Medicine

## 2013-11-02 ENCOUNTER — Ambulatory Visit (INDEPENDENT_AMBULATORY_CARE_PROVIDER_SITE_OTHER): Payer: BC Managed Care – PPO | Admitting: Family Medicine

## 2013-11-02 VITALS — BP 138/88 | HR 67 | Temp 98.0°F | Resp 16 | Wt 176.5 lb

## 2013-11-02 DIAGNOSIS — G500 Trigeminal neuralgia: Secondary | ICD-10-CM

## 2013-11-02 DIAGNOSIS — R0683 Snoring: Secondary | ICD-10-CM | POA: Insufficient documentation

## 2013-11-02 DIAGNOSIS — M5481 Occipital neuralgia: Secondary | ICD-10-CM | POA: Insufficient documentation

## 2013-11-02 DIAGNOSIS — G935 Compression of brain: Secondary | ICD-10-CM

## 2013-11-02 DIAGNOSIS — R0609 Other forms of dyspnea: Secondary | ICD-10-CM

## 2013-11-02 DIAGNOSIS — R0989 Other specified symptoms and signs involving the circulatory and respiratory systems: Secondary | ICD-10-CM

## 2013-11-02 NOTE — Progress Notes (Signed)
Pre visit review using our clinic review tool, if applicable. No additional management support is needed unless otherwise documented below in the visit note. 

## 2013-11-02 NOTE — Progress Notes (Signed)
   Subjective:    Patient ID: Jamie Baker, female    DOB: Mar 15, 1971, 43 y.o.   MRN: [id number]  [facility] f/u- pt was admitted to [facility] on 6/9-10.  Initially thought to have TIA but MRI, carotid dopplers, and ECHO were WNL.  Neuro consult did not feel pt had TIA.  Neuro thought sxs were due to trigeminal neuralgia and started on tegretol.  Pt reports soreness in head has improved.  Will intermittently have sharp pain radiating into face.  Neuro- Dr Trula Ore, interested in new provider.  Lives in [place], interested in someone closer.  Pt has known Chiari malformation- has appt w/ specialist in [place] tomorrow and is planning on tethered cord surgery and cervicocranial instability procedure (decompression failed).  Pt also requesting sleep study due to very loud snoring and husband's complaints.   Review of Systems For ROS see HPI     Objective:   Physical Exam  Vitals reviewed. Constitutional: She is oriented to person, place, and time. She appears well-developed and well-nourished. No distress.  HENT:  Head: Normocephalic and atraumatic.  Eyes: EOM are normal. Pupils are equal, round, and reactive to light.  Cardiovascular: Normal rate, regular rhythm, normal heart sounds and intact distal pulses.   Pulmonary/Chest: Effort normal and breath sounds normal. No respiratory distress. She has no wheezes. She has no rales.  Neurological: She is alert and oriented to person, place, and time. No cranial nerve deficit. Coordination normal.  Skin: Skin is warm and dry.  Psychiatric:  Very anxious          Assessment & Plan:

## 2013-11-02 NOTE — Patient Instructions (Signed)
Follow up as needed Please have your Chiari specialist send me his notes from tomorrow's visit We'll call you with your neuro referral (if they don't do sleep medicine, we'll also refer you to that as well) We'll notify you of your lab results Call with any questions or concerns- particular if your appt tomorrow has you upset and we need to talk about possible treatment of depression Hang in there!

## 2013-11-03 LAB — CARBAMAZEPINE LEVEL, TOTAL: Carbamazepine Lvl: 11.6 ug/mL (ref 4.0–12.0)

## 2013-11-09 NOTE — Assessment & Plan Note (Signed)
New.  Pt mentioned this at end of OV today.  Reports husband complains about her loud snoring and she is interested in having a sleep study.  Will refer to neuro for other issues and see if they are also able to do sleep medicine.  If not, will refer to pulmonary.  Pt expressed understanding and is in agreement w/ plan.

## 2013-11-09 NOTE — Assessment & Plan Note (Signed)
Chronic problem, following w/ specialist in [place].  Is considering surgery for tethered cord.  Asked pt to have specialist send me his notes so I can follow along.

## 2013-11-09 NOTE — Assessment & Plan Note (Signed)
New.  Neuro felt this was the cause of her sxs rather than a true TIA.  Pain has improved on Tegretol.  Will get level and continue until pt is able to f/u w/ local neuro.  Pt expressed understanding and is in agreement w/ plan.

## 2013-11-12 ENCOUNTER — Ambulatory Visit (INDEPENDENT_AMBULATORY_CARE_PROVIDER_SITE_OTHER): Payer: BC Managed Care – PPO | Admitting: Family Medicine

## 2013-11-12 ENCOUNTER — Encounter: Payer: Self-pay | Admitting: Family Medicine

## 2013-11-12 VITALS — BP 124/80 | HR 65 | Temp 98.1°F | Resp 16 | Ht 64.5 in | Wt 180.2 lb

## 2013-11-12 DIAGNOSIS — Z01818 Encounter for other preprocedural examination: Secondary | ICD-10-CM

## 2013-11-12 LAB — CBC WITH DIFFERENTIAL/PLATELET
BASOS PCT: 0.6 % (ref 0.0–3.0)
Basophils Absolute: 0 10*3/uL (ref 0.0–0.1)
EOS PCT: 3.2 % (ref 0.0–5.0)
Eosinophils Absolute: 0.1 10*3/uL (ref 0.0–0.7)
HCT: 38.1 % (ref 36.0–46.0)
HEMOGLOBIN: 12.9 g/dL (ref 12.0–15.0)
Lymphocytes Relative: 29.5 % (ref 12.0–46.0)
Lymphs Abs: 1.2 10*3/uL (ref 0.7–4.0)
MCHC: 33.9 g/dL (ref 30.0–36.0)
MCV: 86.7 fl (ref 78.0–100.0)
MONO ABS: 0.5 10*3/uL (ref 0.1–1.0)
Monocytes Relative: 11.2 % (ref 3.0–12.0)
NEUTROS PCT: 55.5 % (ref 43.0–77.0)
Neutro Abs: 2.3 10*3/uL (ref 1.4–7.7)
Platelets: 219 10*3/uL (ref 150.0–400.0)
RBC: 4.4 Mil/uL (ref 3.87–5.11)
RDW: 12.9 % (ref 11.5–15.5)
WBC: 4.1 10*3/uL (ref 4.0–10.5)

## 2013-11-12 LAB — POCT URINALYSIS DIPSTICK
Bilirubin, UA: NEGATIVE
Blood, UA: NEGATIVE
Glucose, UA: NEGATIVE
KETONES UA: NEGATIVE
Leukocytes, UA: NEGATIVE
Nitrite, UA: NEGATIVE
PROTEIN UA: NEGATIVE
Urobilinogen, UA: 0.2
pH, UA: 6.5

## 2013-11-12 LAB — BASIC METABOLIC PANEL
BUN: 9 mg/dL (ref 6–23)
CALCIUM: 9 mg/dL (ref 8.4–10.5)
CO2: 32 meq/L (ref 19–32)
Chloride: 103 mEq/L (ref 96–112)
Creatinine, Ser: 0.6 mg/dL (ref 0.4–1.2)
GFR: 106.44 mL/min (ref >60.00)
GLUCOSE: 79 mg/dL (ref 70–99)
POTASSIUM: 4.1 meq/L (ref 3.5–5.1)
Sodium: 140 mEq/L (ref 135–145)

## 2013-11-12 LAB — HEPATIC FUNCTION PANEL
ALBUMIN: 4.2 g/dL (ref 3.5–5.2)
ALT: 25 U/L (ref 0–35)
AST: 21 U/L (ref 0–37)
Alkaline Phosphatase: 76 U/L (ref 39–117)
Bilirubin, Direct: 0 mg/dL (ref 0.0–0.3)
TOTAL PROTEIN: 7.4 g/dL (ref 6.0–8.3)
Total Bilirubin: 0.3 mg/dL (ref 0.2–1.2)

## 2013-11-12 LAB — PROTIME-INR
INR: 0.9 ratio (ref 0.8–1.0)
Prothrombin Time: 10.4 s (ref 9.6–13.1)

## 2013-11-12 LAB — MAGNESIUM: Magnesium: 1.8 mg/dL (ref 1.5–2.5)

## 2013-11-12 LAB — APTT: aPTT: 25.2 s (ref 21.7–28.8)

## 2013-11-12 MED ORDER — CARBAMAZEPINE 200 MG PO TABS: 200.0000 mg | ORAL_TABLET | Freq: Two times a day (BID) | ORAL | Status: DC

## 2013-11-12 NOTE — Progress Notes (Signed)
Subjective:    Jamie Baker is a 43 y.o. female who presents to the office today for a preoperative consultation at the request of surgeon Dr Barrett Shell who plans on performing tethered cord on July 14. This consultation is requested for the specific conditions prompting preoperative evaluation (i.e. because of potential affect on operative risk): none. Planned anesthesia: general. The patient has the following known anesthesia issues: none. Patients bleeding risk: no recent abnormal bleeding and no remote history of abnormal bleeding. Patient does not have objections to receiving blood products if needed.  The following portions of the patient's history were reviewed and updated as appropriate: allergies, current medications, past family history, past medical history, past social history, past surgical history and problem list.  Review of Systems A comprehensive review of systems was negative.    Objective:      General Appearance:    Alert, cooperative, no distress, appears stated age  Head:    Normocephalic, without obvious abnormality, atraumatic  Eyes:    PERRL, conjunctiva/corneas clear, EOM's intact, fundi    benign, both eyes  Ears:    Normal TM's and external ear canals, both ears  Nose:   Nares normal, septum midline, mucosa normal, no drainage    or sinus tenderness  Throat:   Lips, mucosa, and tongue normal; teeth and gums normal  Neck:   Supple, symmetrical, trachea midline, no adenopathy;    Thyroid: no enlargement/tenderness/nodules  Back:     Symmetric, no curvature, ROM normal, no CVA tenderness  Lungs:     Clear to auscultation bilaterally, respirations unlabored  Chest Wall:    No tenderness or deformity   Heart:    Regular rate and rhythm, S1 and S2 normal, no murmur, rub   or gallop  Breast Exam:    Deferred to GYN  Abdomen:     Soft, non-tender, bowel sounds active all four quadrants,    no masses, no organomegaly  Genitalia:    Deferred to GYN  Rectal:     Extremities:   Extremities normal, atraumatic, no cyanosis or edema  Pulses:   2+ and symmetric all extremities  Skin:   Skin color, texture, turgor normal, no rashes or lesions  Lymph nodes:   Cervical, supraclavicular, and axillary nodes normal  Neurologic:   CNII-XII intact, normal strength, sensation and reflexes    throughout     Predictors of intubation difficulty:  Morbid obesity? no  Anatomically abnormal facies? no  Prominent incisors? no  Receding mandible? no  Short, thick neck? no  Neck range of motion: normal  Dentition: No chipped, loose, or missing teeth.  Lab Review  To be collected today      Assessment:      43 y.o. female with planned surgery as above.   Known risk factors for perioperative complications: None   Difficulty with intubation is not anticipated.  Cardiac Risk Estimation: low  Current medications which may produce withdrawal symptoms if withheld perioperatively: propranolol, tegretol      Plan:    1. Preoperative workup as follows labs. 2. Change in medication regimen before surgery: none, continue medication regimen including morning of surgery, with sip of water. 3. Prophylaxis for cardiac events with perioperative beta-blockers: already taking. 4. Invasive hemodynamic monitoring perioperatively: at the discretion of anesthesiologist. 5. Deep vein thrombosis prophylaxis postoperatively:regimen to be chosen by surgical team. 6. Surveillance for postoperative MI with ECG immediately postoperatively and on postoperative days 1 and 2 AND troponin levels 24 hours postoperatively  and on day 4 or hospital discharge (whichever comes first): at the discretion of anesthesiologist. 7. Other measures: none

## 2013-11-12 NOTE — Progress Notes (Signed)
Pre visit review using our clinic review tool, if applicable. No additional management support is needed unless otherwise documented below in the visit note. 

## 2013-11-12 NOTE — Patient Instructions (Signed)
Follow up as needed We'll notify you of your lab results and make any changes if needed Call with any questions or concerns GOOD LUCK!!!

## 2014-01-20 ENCOUNTER — Emergency Department (HOSPITAL_COMMUNITY): Payer: BC Managed Care – PPO

## 2014-01-20 ENCOUNTER — Encounter: Payer: Self-pay | Admitting: Family Medicine

## 2014-01-20 ENCOUNTER — Encounter (HOSPITAL_COMMUNITY): Payer: Self-pay | Admitting: Emergency Medicine

## 2014-01-20 ENCOUNTER — Emergency Department (HOSPITAL_COMMUNITY)
Admission: EM | Admit: 2014-01-20 | Discharge: 2014-01-20 | Disposition: A | Discharge status: Home / Self Care | Payer: BC Managed Care – PPO | Attending: Emergency Medicine | Admitting: Emergency Medicine

## 2014-01-20 ENCOUNTER — Ambulatory Visit (INDEPENDENT_AMBULATORY_CARE_PROVIDER_SITE_OTHER): Payer: BC Managed Care – PPO | Admitting: Family Medicine

## 2014-01-20 VITALS — BP 128/74 | HR 68 | Resp 16 | Wt 181.2 lb

## 2014-01-20 DIAGNOSIS — R220 Localized swelling, mass and lump, head: Secondary | ICD-10-CM | POA: Insufficient documentation

## 2014-01-20 DIAGNOSIS — R221 Localized swelling, mass and lump, neck: Secondary | ICD-10-CM

## 2014-01-20 DIAGNOSIS — Z8619 Personal history of other infectious and parasitic diseases: Secondary | ICD-10-CM | POA: Insufficient documentation

## 2014-01-20 DIAGNOSIS — R51 Headache: Secondary | ICD-10-CM | POA: Diagnosis not present

## 2014-01-20 DIAGNOSIS — Z7982 Long term (current) use of aspirin: Secondary | ICD-10-CM | POA: Insufficient documentation

## 2014-01-20 DIAGNOSIS — K219 Gastro-esophageal reflux disease without esophagitis: Secondary | ICD-10-CM | POA: Diagnosis not present

## 2014-01-20 DIAGNOSIS — R519 Headache, unspecified: Secondary | ICD-10-CM | POA: Diagnosis present

## 2014-01-20 DIAGNOSIS — Z8709 Personal history of other diseases of the respiratory system: Secondary | ICD-10-CM | POA: Diagnosis not present

## 2014-01-20 DIAGNOSIS — M792 Neuralgia and neuritis, unspecified: Secondary | ICD-10-CM

## 2014-01-20 DIAGNOSIS — IMO0002 Reserved for concepts with insufficient information to code with codable children: Secondary | ICD-10-CM | POA: Diagnosis not present

## 2014-01-20 DIAGNOSIS — I498 Other specified cardiac arrhythmias: Secondary | ICD-10-CM | POA: Insufficient documentation

## 2014-01-20 DIAGNOSIS — Z79899 Other long term (current) drug therapy: Secondary | ICD-10-CM | POA: Diagnosis not present

## 2014-01-20 LAB — BASIC METABOLIC PANEL
Anion gap: 13 (ref 5–15)
BUN: 11 mg/dL (ref 6–23)
CALCIUM: 8.7 mg/dL (ref 8.4–10.5)
CO2: 25 mEq/L (ref 19–32)
CREATININE: 0.68 mg/dL (ref 0.50–1.10)
Chloride: 104 mEq/L (ref 96–112)
GFR calc Af Amer: >90 mL/min (ref >90)
GFR calc non Af Amer: >90 mL/min (ref >90)
Glucose, Bld: 121 mg/dL — ABNORMAL HIGH (ref 70–99)
Potassium: 3.8 mEq/L (ref 3.7–5.3)
Sodium: 142 mEq/L (ref 137–147)

## 2014-01-20 LAB — CBC WITH DIFFERENTIAL/PLATELET
BASOS ABS: 0 10*3/uL (ref 0.0–0.1)
Basophils Relative: 0 % (ref 0–1)
EOS PCT: 1 % (ref 0–5)
Eosinophils Absolute: 0.1 10*3/uL (ref 0.0–0.7)
HEMATOCRIT: 37.3 % (ref 36.0–46.0)
Hemoglobin: 12.9 g/dL (ref 12.0–15.0)
Lymphocytes Relative: 20 % (ref 12–46)
Lymphs Abs: 1.1 10*3/uL (ref 0.7–4.0)
MCH: 30.4 pg (ref 26.0–34.0)
MCHC: 34.6 g/dL (ref 30.0–36.0)
MCV: 87.8 fL (ref 78.0–100.0)
MONO ABS: 0.5 10*3/uL (ref 0.1–1.0)
Monocytes Relative: 9 % (ref 3–12)
NEUTROS ABS: 4 10*3/uL (ref 1.7–7.7)
Neutrophils Relative %: 70 % (ref 43–77)
PLATELETS: 238 10*3/uL (ref 150–400)
RBC: 4.25 MIL/uL (ref 3.87–5.11)
RDW: 13 % (ref 11.5–15.5)
WBC: 5.7 10*3/uL (ref 4.0–10.5)

## 2014-01-20 MED ORDER — OXYCODONE-ACETAMINOPHEN 5-325 MG PO TABS: 1.0000 | ORAL_TABLET | ORAL | Status: DC | PRN

## 2014-01-20 MED ORDER — ONDANSETRON HCL 4 MG PO TABS: 4.0000 mg | ORAL_TABLET | Freq: Four times a day (QID) | ORAL | Status: DC

## 2014-01-20 MED ORDER — GADOBENATE DIMEGLUMINE 529 MG/ML IV SOLN
17.0000 mL | Freq: Once | INTRAVENOUS | Status: AC | PRN
  Administered 2014-01-20: 17 mL via INTRAVENOUS

## 2014-01-20 MED ORDER — ACETAMINOPHEN 325 MG PO TABS
650.0000 mg | ORAL_TABLET | Freq: Once | ORAL | Status: AC
Start: 2014-01-20 — End: 2014-01-20
  Administered 2014-01-20: 650 mg via ORAL
  Filled 2014-01-20: qty 2

## 2014-01-20 NOTE — ED Notes (Signed)
Pt sent here by PCP for MRI for possible mass behind left eye; pt sts swelling to left side of face x 3 days; pt with multiple medical problems including chiari malformation

## 2014-01-20 NOTE — Patient Instructions (Addendum)
Please go to [facility] ER for evaluation I will call ahead and let them know you are coming and what the situation is Call with any questions or concerns Good Luck!!

## 2014-01-20 NOTE — ED Provider Notes (Signed)
CSN: [id number]     Arrival date & time 01/20/14  1301 History   First MD Initiated Contact with Patient 01/20/14 1325     Chief Complaint  Patient presents with  . Eye Pain  . Facial Swelling     (Consider location/radiation/quality/duration/timing/severity/associated sxs/prior Treatment) HPI Comments: Patient with history of Chiari malformation, migraines, pots, and trigeminal neuralgia reports to the emergency department with chief complaint of swelling to the left side of the face. She states that she has noticed this for the past 3 days. It has been worsening. She states that her coworkers noticed yesterday. She states that she has had decreased sensation to the left side of her face, as well as taste discrepancies, intermittent loss of vision in the left eye, and headache. She states that her symptoms are moderate. They are worsening. She was seen by her primary care doctor to have a MRI of her brain and orbits. This was recommended by her ophthalmologist, and with the worsening of symptoms, the primary care doctor. feel that it needed to be ordered on an emergent basis. A stat MRI was attempted on an outpatient basis, but there were no appointments available. The patient was then referred to the emergency department for further evaluation.  The history is provided by the patient. No language interpreter was used.    Past Medical History  Diagnosis Date  . Chiari I malformation   . Chicken pox   . Bronchitis   . Migraine   . Allergy   . GERD (gastroesophageal reflux disease)   . POTS (postural orthostatic tachycardia syndrome)   . C. difficile diarrhea   . Syncope   . Trigeminal neuralgia    Past Surgical History  Procedure Laterality Date  . Craniectomy suboccipital w/ cervical laminectomy / chiari      pt has metal in her neck  . Abdominal hysterectomy  2011  . Tonsillectomy and adenoidectomy  1975  . Tubal ligation  1989  . Achilles tendon lengthening  2009  . Achilles  tendon repair  2010  . Chairi decompasion  2013   Family History  Problem Relation Age of Onset  . Arthritis Mother   . Breast cancer Mother   . Stroke Mother   . Hyperlipidemia Father   . Hypertension Father   . Diabetes Father   . Brain cancer Father   . Breast cancer Maternal Aunt   . Arthritis Maternal Grandmother   . Breast cancer Maternal Grandmother   . Stroke Maternal Grandfather   . Heart disease Maternal Grandfather   . Colon cancer Neg Hx   . Esophageal cancer Neg Hx   . Rectal cancer Neg Hx   . Stomach cancer Neg Hx    History  Substance Use Topics  . Smoking status: Never Smoker   . Smokeless tobacco: Never Used  . Alcohol Use: No     Comment: occasionally   OB History   Grav Para Term Preterm Abortions TAB SAB Ect Mult Living                 Review of Systems  Constitutional: Negative for fever and chills.  HENT: Positive for facial swelling.   Respiratory: Negative for shortness of breath.   Cardiovascular: Negative for chest pain.  Gastrointestinal: Negative for nausea, vomiting, diarrhea and constipation.  Genitourinary: Negative for dysuria.  Neurological: Positive for numbness and headaches.      Allergies  Levaquin  Home Medications   Prior to Admission medications  Medication Sig Start Date End Date Taking? Authorizing Provider  aspirin EC 325 MG tablet Take 325 mg by mouth 3 (three) times daily.    Historical Provider, MD  butalbital-acetaminophen-caffeine (FIORICET) 50-325-40 MG per tablet Take by mouth 2 (two) times daily as needed for headache. 1-2 tablets every 4 hours as needed for headaches.    Historical Provider, MD  carbamazepine (TEGRETOL) 200 MG tablet Take 1 tablet (200 mg total) by mouth 2 (two) times daily. 11/12/13 11/12/14  Midge Minium, MD  cetirizine (ZYRTEC) 10 MG tablet Take 10 mg by mouth daily.    Historical Provider, MD  cromolyn (NASALCROM) 5.2 MG/ACT nasal spray Place 1 spray into the nose daily.     Historical Provider, MD  fludrocortisone (FLORINEF) 0.1 MG tablet Take 1 tablet (0.1 mg total) by mouth daily. 06/28/13   Midge Minium, MD  HYDROcodone-acetaminophen (NORCO/VICODIN) 5-325 MG per tablet  10/27/13   Historical Provider, MD  midodrine (PROAMATINE) 10 MG tablet Take 1 tablet (10 mg total) by mouth 3 (three) times daily. 06/29/13   Midge Minium, MD  ondansetron (ZOFRAN) 4 MG tablet Take 4 mg by mouth every 8 (eight) hours as needed.  07/15/13   Historical Provider, MD  pantoprazole (PROTONIX) 40 MG tablet Take 1 tablet (40 mg total) by mouth 2 (two) times daily. 09/08/13   Ladene Artist, MD  promethazine (PHENERGAN) 25 MG tablet Take 1 tablet (25 mg total) by mouth every 6 (six) hours as needed for nausea or vomiting. 07/21/13   Midge Minium, MD  propranolol (INDERAL) 20 MG tablet Take 20 mg by mouth daily. 06/28/13   Midge Minium, MD  ranitidine (ZANTAC) 150 MG tablet Take 150 mg by mouth daily.    Historical Provider, MD   BP 137/85  Pulse 94  Temp(Src) 98.2 F (36.8 C) (Oral)  Resp 18  Ht 5\' 4"  (1.626 m)  Wt 181 lb (82.101 kg)  BMI 31.05 kg/m2  SpO2 96% Physical Exam  Nursing note and vitals reviewed. Constitutional: She is oriented to person, place, and time. She appears well-developed and well-nourished.  HENT:  Head: Normocephalic and atraumatic.  Left-sided facial swelling, tenderness to palpation over the left temporal bone to the zygomatic arch, palpable step-off deformity is noted  Eyes: Conjunctivae and EOM are normal. Pupils are equal, round, and reactive to light.  Normal extraocular eye movements  Neck: Normal range of motion. Neck supple.  Cardiovascular: Normal rate and regular rhythm.  Exam reveals no gallop and no friction rub.   No murmur heard. Pulmonary/Chest: Effort normal and breath sounds normal. No respiratory distress. She has no wheezes. She has no rales. She exhibits no tenderness.  Abdominal: Soft. Bowel sounds are normal. She  exhibits no distension and no mass. There is no tenderness. There is no rebound and no guarding.  Musculoskeletal: Normal range of motion. She exhibits no edema and no tenderness.  Neurological: She is alert and oriented to person, place, and time.  Failed finger to nose, patient touches contralateral cheek, decreased sensation to left side of face, CN 3-12 intact  Skin: Skin is warm and dry.  Psychiatric: She has a normal mood and affect. Her behavior is normal. Judgment and thought content normal.    ED Course  Procedures (including critical care time) Results for orders placed during the hospital encounter of 01/20/14  CBC WITH DIFFERENTIAL      Result Value Ref Range   WBC 5.7  4.0 - 10.5 K/uL  RBC 4.25  3.87 - 5.11 MIL/uL   Hemoglobin 12.9  12.0 - 15.0 g/dL   HCT 37.3  36.0 - 46.0 %   MCV 87.8  78.0 - 100.0 fL   MCH 30.4  26.0 - 34.0 pg   MCHC 34.6  30.0 - 36.0 g/dL   RDW 13.0  11.5 - 15.5 %   Platelets 238  150 - 400 K/uL   Neutrophils Relative % 70  43 - 77 %   Neutro Abs 4.0  1.7 - 7.7 K/uL   Lymphocytes Relative 20  12 - 46 %   Lymphs Abs 1.1  0.7 - 4.0 K/uL   Monocytes Relative 9  3 - 12 %   Monocytes Absolute 0.5  0.1 - 1.0 K/uL   Eosinophils Relative 1  0 - 5 %   Eosinophils Absolute 0.1  0.0 - 0.7 K/uL   Basophils Relative 0  0 - 1 %   Basophils Absolute 0.0  0.0 - 0.1 K/uL  BASIC METABOLIC PANEL      Result Value Ref Range   Sodium 142  137 - 147 mEq/L   Potassium 3.8  3.7 - 5.3 mEq/L   Chloride 104  96 - 112 mEq/L   CO2 25  19 - 32 mEq/L   Glucose, Bld 121 (*) 70 - 99 mg/dL   BUN 11  6 - 23 mg/dL   Creatinine, Ser 0.68  0.50 - 1.10 mg/dL   Calcium 8.7  8.4 - 10.5 mg/dL   GFR calc non Af Amer >90  >90 mL/min   GFR calc Af Amer >90  >90 mL/min   Anion gap 13  5 - 15   No results found.    EKG Interpretation None      MDM   Final diagnoses:  None    Patient with concern for mass behind left eye. Seen by and sent from PCP, referred by  ophthalmologist for MRI. Discussed patient with Dr. Dina Rich, who agrees with plan to order MRI. We'll check labs, will reassess.  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 She works as always.  MRIs pending.  Patient signed out to oncoming staff, Drs. Zavitz and [employer], who will continue care.  Montine Circle, PA-C 01/20/14 1541

## 2014-01-20 NOTE — ED Notes (Signed)
Pt remains in MRI, checked in with husband x 2, called MRI while in room to update pt spouse on estimated time frame

## 2014-01-20 NOTE — Progress Notes (Signed)
Pre visit review using our clinic review tool, if applicable. No additional management support is needed unless otherwise documented below in the visit note. 

## 2014-01-20 NOTE — ED Provider Notes (Signed)
Medical screening examination/treatment/procedure(s) were performed by non-physician practitioner and as supervising physician I was immediately available for consultation/collaboration.   EKG Interpretation None        Merryl Hacker, MD 01/20/14 [phone]

## 2014-01-20 NOTE — ED Provider Notes (Signed)
3 days of left facial swelling. PCP wants MRI for possible mass behind the left eye. New left facial numbness. Intermittent vision loss. And a HA. MR brain and orbits wwo. Dispo per MRI. If negative can dc home.   MRI neg for mass or bleed. On re exam CN intact. Motor function intact as well. Has superficial ttp over the left side of her face. Suspect neuralgia. Cannot take gabapentin. Currently on Tegretol for seizures, which may help somewhat. Gave rx for narcotics for pain. WIll need to follow up with neurology as planned on 9/21. Gave strong return precautions including worsening symptoms or any other alarming or concerning symptoms or issues.  Strong return precautions given for worsening symptoms or any other alarming or concerning symptoms or issues. The patient was in agreement with the treatment plan and I answered all of their questions. The patient was stable for dc. At dc, the patient ambulated without difficulty, was moving all four extremities, symptoms improved, NAD. and AOx4 Care discussed with my attending, Dr. Reather Converse. If performed and available, imaging studies and labs reviewed.  Kelby Aline, MD 01/21/14 [phone]

## 2014-01-20 NOTE — Assessment & Plan Note (Signed)
New.  This is very concerning given report of possible mass behind L eye as told to pt by eye doctor.  Must r/o mass, CSF blockage, or cavernous sinus thrombosis.  Pt is young for temporal arteritis but this is on differential given TTP over L temple.  Attempted to get stat MRI of brain and orbits for pt but there were no available outpt imaging appts available.  Discussed this w/ pt and plan is to send her to ER for complete evaluation.  I called and spoke w/ charge nurse, Lexine Baton at [facility] and told her situation and that pt was arriving by private vehicle.

## 2014-01-20 NOTE — ED Notes (Signed)
Brought pt to room via wheelchair with family in tow; pt getting undressed and into a gown at this time with assistance from her husband; Amy, NT aware of pt

## 2014-01-20 NOTE — Progress Notes (Signed)
   Subjective:    Patient ID: Jamie Baker, female    DOB: 10-Apr-1971, 43 y.o.   MRN: [id number]  HPI  Pt saw eye doctor and there is concern for possible mass behind L eye.  He wants pt to have MRI done but he has not ordered this.  Pt reports pain and swelling of L face started 2-3 days ago.  Yesterday coworkers noted the difference.  Had lip numbness starting yesterday and persisting to present.  'my face feels completely different'.  Having L sided HA w/ TTP from zygomatic arch upward.  'it feels like a shelf'.  Denies jaw claudication.  Chiari specialist- Dr Koleen Nimrod [facility]) Ophthalmologist Dr Aida Raider ([place])- seen 8/21   Review of Systems For ROS see HPI     Objective:   Physical Exam  Vitals reviewed. Constitutional: She is oriented to person, place, and time. She appears well-developed and well-nourished.  Obviously uncomfortable  HENT:  TMs WNL bilaterally L facial swelling w/ prominent L zygomatic ridge, + TTP  Eyes:  Pupils are reactive, L possibly more sluggish than R  Neurological: She is alert and oriented to person, place, and time. No cranial nerve deficit.  Skin: Skin is warm and dry. No erythema.  Psychiatric:  anxious          Assessment & Plan:

## 2014-01-21 NOTE — ED Provider Notes (Signed)
I saw and evaluated the patient, reviewed the resident's note and I agree with the findings and plan.   EKG Interpretation None      REsident to follow-up on PA sign out.  Patient primarily seen by PA.  Merryl Hacker, MD 01/21/14 [phone]

## 2014-02-02 ENCOUNTER — Ambulatory Visit (INDEPENDENT_AMBULATORY_CARE_PROVIDER_SITE_OTHER): Payer: BC Managed Care – PPO | Admitting: Family Medicine

## 2014-02-02 ENCOUNTER — Encounter: Payer: Self-pay | Admitting: Family Medicine

## 2014-02-02 VITALS — BP 114/74 | HR 67 | Temp 97.5°F | Resp 16 | Wt 182.2 lb

## 2014-02-02 DIAGNOSIS — J029 Acute pharyngitis, unspecified: Secondary | ICD-10-CM

## 2014-02-02 DIAGNOSIS — D353 Benign neoplasm of craniopharyngeal duct: Secondary | ICD-10-CM

## 2014-02-02 DIAGNOSIS — D352 Benign neoplasm of pituitary gland: Secondary | ICD-10-CM

## 2014-02-02 DIAGNOSIS — J0100 Acute maxillary sinusitis, unspecified: Secondary | ICD-10-CM

## 2014-02-02 MED ORDER — AMOXICILLIN 875 MG PO TABS: 875.0000 mg | ORAL_TABLET | Freq: Two times a day (BID) | ORAL | Status: DC

## 2014-02-02 NOTE — Patient Instructions (Signed)
Follow up as needed Please bring a copy of your MRI to your neurosurgeon and neurologist The MRI did not definitively see a pituitary adenoma but it could not exclude it either- please follow up w/ Neurosurgery Start the Amoxicillin as directed for a likely sinus infection Drink plenty of fluids REST! Use Mucinex DM as needed for cough and congestion Call with any questions or concerns Hang in there!!

## 2014-02-02 NOTE — Progress Notes (Signed)
Pre visit review using our clinic review tool, if applicable. No additional management support is needed unless otherwise documented below in the visit note. 

## 2014-02-02 NOTE — Progress Notes (Signed)
   Subjective:    Patient ID: Jamie Baker, female    DOB: 1970-09-22, 43 y.o.   MRN: [id number]  HPI ER f/u- pt went to ER on 9/3 for severe L sided facial pain.  Had MRI done to r/o mass behind the eye, cavernous sinus thrombosis.  Pt continues to have facial pain but no radiologic cause identified.  MRI indicated 'cannot exclude small R sided pituitary microadenoma'.  Pt has neurosurgery appt scheduled for 10/15.  Pt has appt scheduled w/ Dr Jannifer Franklin (neuro in [place]).  URI- Yesterday woke up w/ sore throat, chills, cough.  No sinus pain/pressure.  No fever.  No ear pain.  No known sick contacts but there were multiple 'snotty babies' at church   Review of Systems For ROS see HPI     Objective:   Physical Exam  Vitals reviewed. Constitutional: She appears well-developed and well-nourished. No distress.  HENT:  Head: Normocephalic and atraumatic.  Right Ear: Tympanic membrane normal.  Left Ear: Tympanic membrane normal.  Nose: Mucosal edema and rhinorrhea present. Right sinus exhibits maxillary sinus tenderness. Right sinus exhibits no frontal sinus tenderness. Left sinus exhibits maxillary sinus tenderness. Left sinus exhibits no frontal sinus tenderness.  Mouth/Throat: Uvula is midline and mucous membranes are normal. Posterior oropharyngeal erythema present. No oropharyngeal exudate.  Eyes: Conjunctivae and EOM are normal. Pupils are equal, round, and reactive to light.  Neck: Normal range of motion. Neck supple.  Cardiovascular: Normal rate, regular rhythm and normal heart sounds.   Pulmonary/Chest: Effort normal and breath sounds normal. No respiratory distress. She has no wheezes.  Lymphadenopathy:    She has no cervical adenopathy.          Assessment & Plan:

## 2014-02-06 ENCOUNTER — Encounter: Payer: Self-pay | Admitting: Family Medicine

## 2014-02-06 DIAGNOSIS — R0683 Snoring: Secondary | ICD-10-CM

## 2014-02-06 DIAGNOSIS — D352 Benign neoplasm of pituitary gland: Secondary | ICD-10-CM | POA: Insufficient documentation

## 2014-02-06 DIAGNOSIS — J0100 Acute maxillary sinusitis, unspecified: Secondary | ICD-10-CM | POA: Insufficient documentation

## 2014-02-06 NOTE — Assessment & Plan Note (Signed)
New.  Pt's PE consistent w/ sinusitis.  Due to multiple medical issues, will start abx.  Reviewed supportive care and red flags that should prompt return.  Pt expressed understanding and is in agreement w/ plan.

## 2014-02-06 NOTE — Assessment & Plan Note (Signed)
New.  Found incidentally on MRI.  Pt has neurosurgeon in [place], upcoming appt w/ Neuro in [place].  Encouraged her to take copies of MRI to both- printed for her convenience.  Will follow along and assist as able.

## 2014-02-07 NOTE — Telephone Encounter (Signed)
Referral placed.

## 2014-02-09 ENCOUNTER — Other Ambulatory Visit: Payer: Self-pay | Admitting: Family Medicine

## 2014-02-09 NOTE — Telephone Encounter (Signed)
Med filled.  

## 2014-02-14 ENCOUNTER — Institutional Professional Consult (permissible substitution): Payer: Self-pay | Admitting: Neurology

## 2014-02-17 ENCOUNTER — Institutional Professional Consult (permissible substitution): Payer: Self-pay | Admitting: Neurology

## 2014-02-21 ENCOUNTER — Encounter: Payer: Self-pay | Admitting: Neurology

## 2014-02-21 ENCOUNTER — Ambulatory Visit (INDEPENDENT_AMBULATORY_CARE_PROVIDER_SITE_OTHER): Payer: BC Managed Care – PPO | Admitting: Neurology

## 2014-02-21 VITALS — BP 112/80 | HR 75 | Temp 98.1°F | Resp 14 | Ht 65.0 in | Wt 182.5 lb

## 2014-02-21 DIAGNOSIS — G935 Compression of brain: Secondary | ICD-10-CM

## 2014-02-21 DIAGNOSIS — R0683 Snoring: Secondary | ICD-10-CM

## 2014-02-21 NOTE — Progress Notes (Addendum)
SLEEP MEDICINE CLINIC   Provider:  Larey Seat, M D  Referring Provider: Midge Minium, MD Primary Care Physician:  Annye Asa, MD  Chief Complaint  Patient presents with  . NP    Sleep study Referral from Dr. Birdie Riddle    HPI:  Jamie Baker is a 43 y.o. female, who is seen here as a referral from Dr. Birdie Riddle for evaluation of a sleep disorder,  Dear Dr Birdie Riddle,   Thank you for allowing me to participate in your patients care. She is most interesting and complicated - Here are my notes.  Jamie Baker, a caucasian, married, right-handed female is seen today in a first Sleep consultation.  The patient has an interesting and extensive past medical history involving several neurologic systems. 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 Her surgical history is extensive she had tubal ligation in 1989 hysterectomy 2011 Chiari decompression 2013 ICP monitoring 2015 patch 2015 tethered cord release 2015 she also underwent a tonsillectomy in early childhood and she had an anterior cervical discectomy and fusion between C5-6 and C6-7 in 2014. The patient reports fainting, ( POTS) and has been snoring so loudly, that her husband left the marital bedroom. He noticed crescendo snoring , but is unsure of apnea.  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 She wakes her self gasping for breath - she sleeps with the head of bed elevated and in supine, she does not wear the neck support during the night.  She sleeps promptly after going to bed at 9 PM , her bedroom is cool, quiet and dark, she sleeps about 8 hours . She wakes by alarm at about 5.30 ,  Has one bathroom break at the most.    Her decompression was  performed by Dr Elisabeth Cara. The patients daughter has  Drue Dun , with a  tethered cord and Aneurysm and Arnold Chiari.    Review of Systems: Out of a complete 14 system review, the patient complains of only the following symptoms, and all other reviewed systems are negative. Review of systems the patient describes that she feels as if somebody  "is pulling her nerves "all the time as extremities optimal. She  further endorsed headaches, numbness and weakness the numbness affects both arms in a cape distribution sleepiness snoring and restless legs fainting and tremors joint pain aching muscles skin sensitivity and easy bruising, blurred vision and loss of vision, snoring, swelling in the legs ringing in the ears, and vertigo.     Epworth score 17 , Fatigue severity score 46   ,  PHQ depression score not obtained.   History   Social History  . Marital Status: Married    Spouse Name: N/A    Number of Children: 3  . Years of Education: N/A   Occupational History  . Accountant    Social History Main Topics  . Smoking status: Never Smoker   . Smokeless tobacco: Never Used  . Alcohol Use: No     Comment: occasionally  . Drug Use: No  . Sexual Activity: Not on file   Other Topics Concern  . Not on file   Social History Narrative   Right handed.  Work- Public house manager, Married, 3 kids.      Family History  Problem Relation Age of Onset  .  Arthritis Mother   . Breast cancer Mother   . Stroke Mother   . Hyperlipidemia Father   . Hypertension Father   . Diabetes Father   . Brain cancer Father   . Breast cancer Maternal Aunt   . Arthritis Maternal Grandmother   . Breast cancer Maternal Grandmother   . Stroke Maternal Grandfather   . Heart disease Maternal Grandfather   . Colon cancer Neg Hx   . Esophageal cancer Neg Hx   . Rectal cancer Neg Hx   . Stomach cancer Neg Hx     Past Medical History  Diagnosis Date  . Chiari I malformation   . Chicken pox   . Bronchitis   . Migraine   . Allergy   . GERD (gastroesophageal reflux disease)   .  POTS (postural orthostatic tachycardia syndrome)   . C. difficile diarrhea   . Syncope   . Trigeminal neuralgia   . Ehlers-Danlos syndrome   . Mast cell disease   . Spina bifida occulta     Past Surgical History  Procedure Laterality Date  . Craniectomy suboccipital w/ cervical laminectomy / chiari      pt has metal in her neck  . Abdominal hysterectomy  2011  . Tonsillectomy and adenoidectomy  1975  . Tubal ligation  1989  . Achilles tendon lengthening  2009  . Achilles tendon repair  2010  . Chairi decompasion  2013  . Spinal cord surgery    . Acdf  2014    5,6,7     Current Outpatient Prescriptions  Medication Sig Dispense Refill  . amoxicillin (AMOXIL) 875 MG tablet Take 1 tablet (875 mg total) by mouth 2 (two) times daily.  20 tablet  0  . butalbital-acetaminophen-caffeine (FIORICET) 50-325-40 MG per tablet Take by mouth 2 (two) times daily as needed for headache. 1-2 tablets every 4 hours as needed for headaches.      . carbamazepine (TEGRETOL) 200 MG tablet Take 1 tablet (200 mg total) by mouth 2 (two) times daily.  60 tablet  3  . cetirizine (ZYRTEC) 10 MG tablet Take 10 mg by mouth daily.      . fludrocortisone (FLORINEF) 0.1 MG tablet Take 0.1 mg by mouth daily as needed.      . midodrine (PROAMATINE) 10 MG tablet Take 1 tablet (10 mg total) by mouth 3 (three) times daily.  90 tablet  6  . ondansetron (ZOFRAN) 4 MG tablet Take 4 mg by mouth every 8 (eight) hours as needed for nausea or vomiting.       . ondansetron (ZOFRAN) 4 MG tablet Take 1 tablet (4 mg total) by mouth every 6 (six) hours.  12 tablet  0  . promethazine (PHENERGAN) 25 MG tablet Take 1 tablet (25 mg total) by mouth every 6 (six) hours as needed for nausea or vomiting.  30 tablet  0  . propranolol (INDERAL) 20 MG tablet Take 20 mg by mouth daily.      . ranitidine (ZANTAC) 150 MG tablet Take 150 mg by mouth daily.      Marland Kitchen acetaZOLAMIDE (DIAMOX) 250 MG tablet 250 mg daily.      . bethanechol (URECHOLINE)  10 MG tablet 10 mg daily.      . diazepam (VALIUM) 5 MG tablet 5 mg daily.      Marland Kitchen ketorolac (TORADOL) 10 MG tablet 10 mg daily.      . metoCLOPramide (REGLAN) 10 MG tablet 10 mg daily.  No current facility-administered medications for this visit.    Allergies as of 02/21/2014 - Review Complete 02/21/2014  Allergen Reaction Noted  . Levaquin [levofloxacin in d5w]  11/19/2011    Vitals: BP 112/80  Pulse 75  Temp(Src) 98.1 F (36.7 C) (Oral)  Resp 14  Ht 5\' 5"  (1.651 m)  Wt 182 lb 8 oz (82.781 kg)  BMI 30.37 kg/m2 Last Weight:  Wt Readings from Last 1 Encounters:  02/21/14 182 lb 8 oz (82.781 kg)       Last Height:   Ht Readings from Last 1 Encounters:  02/21/14 5\' 5"  (1.651 m)    Physical exam:  General: The patient is awake, alert and appears not in acute distress. The patient is well groomed. She is truncally obese, with slimmer extremities, after treatment with steroids.  Head: Normocephalic, atraumatic. Neck is supple. Mallampati 2 ,  neck circumference:16. Nasal airflow unrestricted , TMJ is  Not  evident . Retrognathia is seen.  Cardiovascular:  Regular rate and rhythm, without  murmurs or carotid bruit, and without distended neck veins. Respiratory: Lungs are clear to auscultation. Skin:  Without evidence of edema, or rash Trunk: BMI is  elevated and patient  has normal posture.  Neurologic exam : The patient is awake and alert, oriented to place and time.   Memory subjective described as intact.  She has episodic loss of memory and reduced awareness.  There is a normal attention span & concentration ability. Speech is fluent without  dysarthria, dysphonia or aphasia. Mood and affect are appropriate.  Cranial nerves: Pupils are equal and briskly reactive to light. Funduscopic exam without  evidence of pallor or edema. Extraocular movements in vertical and horizontal planes intact and without nystagmus.  Visual fields by finger perimetry are intact. Hearing to  finger rub intact. Facial sensation intact to fine touch. Facial motor strength is symmetric and tongue and uvula move midline.  The tongue protrudes with equal strength into both cheeks, shoulder shrug is symmetric.   Motor exam:   Reduced  Tone, weakness of grip, symmetry of  muscle bulk and strength pattern in upper  Extremities.   Sensory:  Fine touch, pinprick and vibration were tested in all extremities. Proprioception is tested in the upper extremities only. This was normal.  Coordination: Rapid alternating movements in the fingers/hands is normal. Finger-to-nose maneuver  with evidence of dysmetria - no satelliting, straight to the cheek instead of the tip of the nose. but not  Tremor, no pronator drift noted.   Gait and station: Patient walks without assistive device and is able unassisted to climb up to the exam table. Strength within normal limits.  Stance is stable and normal. Tandem gait is unfragmented. Romberg testing is positive. . The patient literally fell into my arms, within a second of closing her eyes- very unusual.  Deep tendon reflexes: in the  upper and lower extremities are symmetric and intact.  Babinski maneuver response is  down going.   Assessment:  After physical and neurologic examination, review of laboratory studies, imaging, neurophysiology testing and pre-existing records, assessment is     Sleep evaluation is positive for several OSA and CSA risk factors , clinical symptoms and anatomical and postsurgical condition.  1)The Arnold Chiari and the treatment with diamox can promote CSA, CO2 retention and morning headaches.  Metabolic alkalosis, respiratory compensation.   2)Post surgical occipital neuralgia.   3)Has trigeminal neuralgia on the left, with flushing and puffiness.  4)steroid induced obesity.  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  6) Propanolol for sleep helps with sustaining sleep , In a POTS patient.     The patient was advised of the nature of the diagnosed sleep disorder , the treatment options and risks for general a health and wellness arising from not treating the condition.  Visit duration was 60 minutes.  Over 30 minutes were needed to explain condition and effect on sleep.   Plan:  Treatment plan and additional workup :I will be happy to address the sleep disorder. SPLIT study order with CO2 and score at 3 % and SPLIT at 15 AHI.   I am not a specialist is POTS or Roselie Awkward -Chiari malformation, but will address all related sleep issues. Jamie Partridge Desta Bujak MD  02/21/2014

## 2014-02-21 NOTE — Patient Instructions (Signed)
Postural Orthostatic Tachycardia Syndrome Postural orthostatic tachycardia syndrome (POTS) is an increased heart rate when going from a lying (supine) position to a standing position. The heart rate may increase more than 30 beats per minute (BPM) above its resting rate when going from a lying to a standing position. POTS occurs more frequently in women than in men.  SYMPTOMS  POTS symptoms may be increased in the morning. Symptoms of POTS include:  Fainting or near fainting.  Inability to think clearly.  Extreme or chronic fatigue.  Exercise intolerance.  Chest pain.  Having the lower legs develop a reddish-blue color due to decreased blood flow (acrocyanosis). CAUSES POTS can be caused by different conditions. Sometimes, it has no known cause (idiopathic). Some causes of POTS include:  Viral illness.  Pregnancy.  Autoimmune diseases.  Medications.  Major surgery.  Trauma such as a car accident or major injury.  Medical conditions such as anemia, dehydration, and hyperthyroidism. DIAGNOSIS  POTS is diagnosed by:  Taking a complete history and physical exam.  Measuring the heart rate while lying and then upon standing.  Measuring blood pressure when going from a lying to a standing position. POTS is usually not associated with low blood pressure (orthostatic hypotension) when going from a lying to standing position. While standing, blood pressure should be taken 2, 5, and 10 minutes after getting up. TREATMENT  Treatment of POTS depends upon the severity of the symptoms. Treatment includes:  Drinking plenty of fluids to avoid getting dehydrated.  Avoiding very hot environments to not get overheated.  Increasing your dietary salt intake as instructed by your caregiver.  Taking different types of medications as prescribed for POTS.  Avoiding some classes of medications such as vasodilators and diuretics. SEEK IMMEDIATE MEDICAL CARE IF  You have severe chest pain  that does not go away. Call your local emergency service immediately.  You feel your heart racing or beating rapidly.  You feel like passing out.  You have very confused thinking. MAKE SURE YOU  Understand these instructions.  Will watch your condition.  Will get help right away if you are not doing well or get worse. Document Released: 04/26/2002 Document Revised: 09/20/2013 Document Reviewed: 07/04/2010 [facility] Patient Information 2015 [place], [place]. This information is not intended to replace advice given to you by your health care provider. Make sure you discuss any questions you have with your health care provider. Chiari Malformation Chiari malformation (CM) causes brain tissue to settle into the spinal canal. There are four types of CM.  Type 1 is most common. It can go unnoticed until problems start, usually with headaches in young adults.  Type 2 is present at birth and always involves a form of spina bifida. Part of the spinal cord pushes through the spine and is exposed. Spina bifida usually causes paralysis of the legs.  Type 3 is more severe because it involves more brain tissue.  Type 4 is the most severe because the brain does not develop correctly. Adults and adolescents who are unaware they have Type 1 CM may have headaches that are located in the back of the head and get worse with coughing or straining. If more brain tissue is involved, problems may include dizziness, trouble with balance, and vision issues. Diagnosis is made by an MRI. TREATMENT  Babies may need surgery to repair spina bifida. Medications may be used to control pain. Some adults with CM may benefit from surgery for which the goal is to keep the malformation from  getting worse. Document Released: 04/26/2002 Document Revised: 09/20/2013 Document Reviewed: 05/03/2008 [facility] Patient Information 2015 [place], [place]. This information is not intended to replace advice given to you by your health care  provider. Make sure you discuss any questions you have with your health care provider.

## 2014-03-23 ENCOUNTER — Telehealth: Payer: Self-pay | Admitting: Family Medicine

## 2014-03-23 NOTE — Telephone Encounter (Signed)
Patient Information:  Caller Name: Colletta [place]  Phone: [phone]  Patient: Jamie Baker, Jamie Baker  Gender: Female  DOB: [birth year]  Age: 43 Years  PCP: Midge Minium  Pregnant: No  Office Follow Up:  Does the office need to follow up with this patient?: No  Instructions For The Office: N/A  RN Note:  History of POTS. Feels like "heart is pounding."  Current pulse 105 and regular.  Not drinking caffeine. Encouraged hydration due to mild diarrhea. Offered and declined apopintment.  Symptoms  Reason For Call & Symptoms: Trasnferred from Stephanie/office for Hypertension (134/88 left arm) with palpations.  Pulse ranging from 108-112 then dropped to 45 and went back up.  Pulse currently 112.  Mild diarhea present.  Reviewed Health History In EMR: Yes  Reviewed Medications In EMR: Yes  Reviewed Allergies In EMR: Yes  Reviewed Surgeries / Procedures: Yes  Date of Onset of Symptoms: 03/23/2014  Treatments Tried: sat at desk, drinking water  Treatments Tried Worked: No OB / GYN:  LMP: Unknown  Guideline(s) Used:  Heart Rate and Heartbeat Questions  Disposition Per Guideline:   Home Care  Reason For Disposition Reached:   Palpitations  Advice Given:  Reassurance  Everybody has palpitations at some point in their lives. Sometimes it is simply a heightened awareness of the heart's normal beating.  Occasional extra heart beats are experienced by most everyone. Lack of sleep, stress, and caffeinated beverages can make palpitations worse.  Patients with anxiety or stress may describe a "rapid heartbeat" or "pounding" in their chest from their heart beating.  Avoid Caffeine  Avoid caffeine-containing beverages (Reason: caffeine is a stimulant and can aggravate palpitations).  Expected Course  : If your symptoms do not improve over the next couple days then you should make an appointment to see your doctor.  Call Back If:  Chest pain, lightheadedness, or difficulty breathing occurs  Heart beating more than 130 beats / minute  More than 3 extra or skipped beats / minute  You become worse.  Patient Will Follow Care Advice:  YES

## 2014-03-30 ENCOUNTER — Ambulatory Visit (INDEPENDENT_AMBULATORY_CARE_PROVIDER_SITE_OTHER): Payer: BC Managed Care – PPO | Admitting: Neurology

## 2014-03-30 DIAGNOSIS — G4710 Hypersomnia, unspecified: Secondary | ICD-10-CM

## 2014-03-30 DIAGNOSIS — R0683 Snoring: Secondary | ICD-10-CM

## 2014-03-30 DIAGNOSIS — G935 Compression of brain: Secondary | ICD-10-CM

## 2014-03-30 DIAGNOSIS — G4730 Sleep apnea, unspecified: Principal | ICD-10-CM

## 2014-03-31 NOTE — Sleep Study (Signed)
Please see the scanned sleep study interpretation located in the Procedure tab within the chart review section

## 2014-04-05 ENCOUNTER — Encounter: Payer: Self-pay | Admitting: Neurology

## 2014-04-05 DIAGNOSIS — G4730 Sleep apnea, unspecified: Principal | ICD-10-CM

## 2014-04-05 DIAGNOSIS — G4710 Hypersomnia, unspecified: Secondary | ICD-10-CM | POA: Insufficient documentation

## 2014-04-05 DIAGNOSIS — G935 Compression of brain: Secondary | ICD-10-CM | POA: Insufficient documentation

## 2014-04-06 ENCOUNTER — Encounter: Payer: Self-pay | Admitting: *Deleted

## 2014-04-06 ENCOUNTER — Other Ambulatory Visit: Payer: Self-pay | Admitting: Neurology

## 2014-04-06 ENCOUNTER — Telehealth: Payer: Self-pay | Admitting: *Deleted

## 2014-04-06 DIAGNOSIS — G4733 Obstructive sleep apnea (adult) (pediatric): Secondary | ICD-10-CM

## 2014-04-06 NOTE — Telephone Encounter (Signed)
Patient was contacted and provided the results of her overnight split study.  Patient was informed that due to the diagnosis of sleep apnea, CPAP therapy had been recommended.  Patient was in agreement and referred to [place] for CPAP set up.  Patient was mailed a copy of the test results and Dr. Annye Asa was faxed a copy.   Patient instructed to contact our office 6-8 weeks post set up to schedule a follow up appointment.

## 2014-04-22 ENCOUNTER — Encounter: Payer: Self-pay | Admitting: Family Medicine

## 2014-04-22 ENCOUNTER — Ambulatory Visit (INDEPENDENT_AMBULATORY_CARE_PROVIDER_SITE_OTHER): Payer: BC Managed Care – PPO | Admitting: Family Medicine

## 2014-04-22 VITALS — BP 110/78 | HR 82 | Temp 98.0°F | Resp 16 | Ht 64.0 in | Wt 187.4 lb

## 2014-04-22 DIAGNOSIS — Z01818 Encounter for other preprocedural examination: Secondary | ICD-10-CM

## 2014-04-22 DIAGNOSIS — N951 Menopausal and female climacteric states: Secondary | ICD-10-CM

## 2014-04-22 LAB — POCT URINALYSIS DIPSTICK
BILIRUBIN UA: NEGATIVE
GLUCOSE UA: NEGATIVE
KETONES UA: NEGATIVE
Leukocytes, UA: NEGATIVE
Nitrite, UA: NEGATIVE
Protein, UA: NEGATIVE
RBC UA: NEGATIVE
SPEC GRAV UA: 1.015
Urobilinogen, UA: 0.2
pH, UA: 7

## 2014-04-22 NOTE — Progress Notes (Signed)
   Subjective:    Patient ID: Jamie Baker, female    DOB: 10/03/1970, 43 y.o.   MRN: [id number]  HPI Surgical clearance- pt is having occipital to C2 fusion.  This is 3rd neurosurgery this year.  No hx of complications w/ anesthesia.  No cardiac or metabolic issues that would make pt high risk.  Not on any blood thinning medications.  No CP, SOB, N/V.   Review of Systems For ROS see HPI     Objective:   Physical Exam  Constitutional: She is oriented to person, place, and time. She appears well-developed and well-nourished. No distress.  HENT:  Head: Normocephalic and atraumatic.  Eyes: Conjunctivae and EOM are normal. Pupils are equal, round, and reactive to light.  Neck: Neck supple. No thyromegaly present.  Cardiovascular: Normal rate, regular rhythm, normal heart sounds and intact distal pulses.   No murmur heard. Pulmonary/Chest: Effort normal and breath sounds normal. No respiratory distress.  Abdominal: Soft. She exhibits no distension. There is no tenderness.  Musculoskeletal: She exhibits no edema.  Lymphadenopathy:    She has no cervical adenopathy.  Neurological: She is alert and oriented to person, place, and time.  Skin: Skin is warm and dry.  Psychiatric: She has a normal mood and affect. Her behavior is normal.  Vitals reviewed.         Assessment & Plan:

## 2014-04-22 NOTE — Patient Instructions (Signed)
Follow up as needed We'll notify you of your lab results and make any changes if needed (along w/ fax them to Dr Kris Mouton and Charleen Kirks) I see no reason not to proceed w/ surgery Have them schedule a 15 min diabetes visit for your husband- keep the new pt appt in March Call with any questions or concerns Hang in there! Merry Christmas!!!

## 2014-04-22 NOTE — Progress Notes (Signed)
Pre visit review using our clinic review tool, if applicable. No additional management support is needed unless otherwise documented below in the visit note. 

## 2014-04-24 LAB — BASIC METABOLIC PANEL
BUN: 10 mg/dL (ref 6–23)
CO2: 28 meq/L (ref 19–32)
Calcium: 9.6 mg/dL (ref 8.4–10.5)
Chloride: 104 mEq/L (ref 96–112)
Creatinine, Ser: 0.7 mg/dL (ref 0.4–1.2)
GFR: 94.24 mL/min (ref >60.00)
GLUCOSE: 62 mg/dL — AB (ref 70–99)
POTASSIUM: 5.1 meq/L (ref 3.5–5.1)
Sodium: 141 mEq/L (ref 135–145)

## 2014-04-24 LAB — CBC WITH DIFFERENTIAL/PLATELET
BASOS PCT: 0.3 % (ref 0.0–3.0)
Basophils Absolute: 0 10*3/uL (ref 0.0–0.1)
EOS PCT: 0.9 % (ref 0.0–5.0)
Eosinophils Absolute: 0 10*3/uL (ref 0.0–0.7)
HCT: 40 % (ref 36.0–46.0)
HEMOGLOBIN: 12.9 g/dL (ref 12.0–15.0)
LYMPHS PCT: 29.8 % (ref 12.0–46.0)
Lymphs Abs: 1.3 10*3/uL (ref 0.7–4.0)
MCHC: 32.3 g/dL (ref 30.0–36.0)
MCV: 90.1 fl (ref 78.0–100.0)
MONOS PCT: 8.8 % (ref 3.0–12.0)
Monocytes Absolute: 0.4 10*3/uL (ref 0.1–1.0)
Neutro Abs: 2.7 10*3/uL (ref 1.4–7.7)
Neutrophils Relative %: 60.2 % (ref 43.0–77.0)
Platelets: 203 10*3/uL (ref 150.0–400.0)
RBC: 4.45 Mil/uL (ref 3.87–5.11)
RDW: 13.4 % (ref 11.5–15.5)
WBC: 4.5 10*3/uL (ref 4.0–10.5)

## 2014-04-24 LAB — APTT: aPTT: 27.7 s (ref 23.4–32.7)

## 2014-04-24 LAB — HEPATIC FUNCTION PANEL
ALT: 13 U/L (ref 0–35)
AST: 19 U/L (ref 0–37)
Albumin: 3.9 g/dL (ref 3.5–5.2)
Alkaline Phosphatase: 100 U/L (ref 39–117)
Bilirubin, Direct: 0 mg/dL (ref 0.0–0.3)
Total Bilirubin: 0.5 mg/dL (ref 0.2–1.2)
Total Protein: 7 g/dL (ref 6.0–8.3)

## 2014-04-24 LAB — FOLLICLE STIMULATING HORMONE: FSH: 6 m[IU]/mL

## 2014-04-24 LAB — PROTIME-INR
INR: 0.9 ratio (ref 0.8–1.0)
Prothrombin Time: 10.2 s (ref 9.6–13.1)

## 2014-04-24 NOTE — Assessment & Plan Note (Signed)
Pt has had 3 operative procedures this year and has tolerated all of them w/o difficulty.  No hx of difficulty w/ anesthesia.  No cardiac or pulmonary issues that would cause complications.  Check labs.  I see no reason not to proceed w/ surgery.

## 2014-05-03 ENCOUNTER — Other Ambulatory Visit: Payer: Self-pay | Admitting: Family Medicine

## 2014-05-03 NOTE — Telephone Encounter (Signed)
Last OV 04/22/14 Tegretol last filled by Dr. Birdie Riddle on 11/12/13 #60 with 3

## 2014-05-11 ENCOUNTER — Telehealth: Payer: Self-pay | Admitting: Family Medicine

## 2014-05-11 NOTE — Telephone Encounter (Signed)
Error/gd °

## 2014-05-16 ENCOUNTER — Encounter: Payer: Self-pay | Admitting: Neurology

## 2014-05-16 ENCOUNTER — Ambulatory Visit (INDEPENDENT_AMBULATORY_CARE_PROVIDER_SITE_OTHER): Payer: BC Managed Care – PPO | Admitting: Neurology

## 2014-05-16 VITALS — BP 114/75 | HR 82 | Resp 18 | Ht 64.0 in | Wt 158.0 lb

## 2014-05-16 DIAGNOSIS — R000 Tachycardia, unspecified: Secondary | ICD-10-CM

## 2014-05-16 DIAGNOSIS — I498 Other specified cardiac arrhythmias: Secondary | ICD-10-CM

## 2014-05-16 DIAGNOSIS — G90A Postural orthostatic tachycardia syndrome (POTS): Secondary | ICD-10-CM

## 2014-05-16 DIAGNOSIS — R6889 Other general symptoms and signs: Secondary | ICD-10-CM

## 2014-05-16 DIAGNOSIS — Z9989 Dependence on other enabling machines and devices: Principal | ICD-10-CM

## 2014-05-16 DIAGNOSIS — IMO0001 Reserved for inherently not codable concepts without codable children: Secondary | ICD-10-CM

## 2014-05-16 DIAGNOSIS — G4733 Obstructive sleep apnea (adult) (pediatric): Secondary | ICD-10-CM

## 2014-05-16 DIAGNOSIS — I951 Orthostatic hypotension: Secondary | ICD-10-CM

## 2014-05-16 DIAGNOSIS — F04 Amnestic disorder due to known physiological condition: Secondary | ICD-10-CM

## 2014-05-16 DIAGNOSIS — R569 Unspecified convulsions: Secondary | ICD-10-CM

## 2014-05-16 NOTE — Addendum Note (Signed)
Addended by: Larey Seat on: 05/16/2014 02:27 PM   Modules accepted: Orders
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
## 2014-05-16 NOTE — Patient Instructions (Signed)
Referral to Dr. Ellouise Newer ,MD  For EEG, 72 hours.

## 2014-05-24 ENCOUNTER — Telehealth: Payer: Self-pay | Admitting: Neurology

## 2014-05-24 DIAGNOSIS — R4789 Other speech disturbances: Secondary | ICD-10-CM

## 2014-05-27 ENCOUNTER — Ambulatory Visit (INDEPENDENT_AMBULATORY_CARE_PROVIDER_SITE_OTHER): Payer: BLUE CROSS/BLUE SHIELD | Admitting: Neurology

## 2014-05-27 DIAGNOSIS — R4789 Other speech disturbances: Secondary | ICD-10-CM

## 2014-05-27 DIAGNOSIS — R569 Unspecified convulsions: Secondary | ICD-10-CM

## 2014-05-27 DIAGNOSIS — F04 Amnestic disorder due to known physiological condition: Secondary | ICD-10-CM

## 2014-05-31 ENCOUNTER — Encounter (HOSPITAL_BASED_OUTPATIENT_CLINIC_OR_DEPARTMENT_OTHER): Payer: Self-pay | Admitting: Emergency Medicine

## 2014-05-31 ENCOUNTER — Emergency Department (HOSPITAL_BASED_OUTPATIENT_CLINIC_OR_DEPARTMENT_OTHER)
Admission: EM | Admit: 2014-05-31 | Discharge: 2014-05-31 | Disposition: A | Discharge status: Home / Self Care | Payer: BLUE CROSS/BLUE SHIELD | Attending: Emergency Medicine | Admitting: Emergency Medicine

## 2014-05-31 DIAGNOSIS — K219 Gastro-esophageal reflux disease without esophagitis: Secondary | ICD-10-CM | POA: Diagnosis not present

## 2014-05-31 DIAGNOSIS — Z87728 Personal history of other specified (corrected) congenital malformations of nervous system and sense organs: Secondary | ICD-10-CM | POA: Insufficient documentation

## 2014-05-31 DIAGNOSIS — R519 Headache, unspecified: Secondary | ICD-10-CM

## 2014-05-31 DIAGNOSIS — Z79899 Other long term (current) drug therapy: Secondary | ICD-10-CM | POA: Diagnosis not present

## 2014-05-31 DIAGNOSIS — Z8619 Personal history of other infectious and parasitic diseases: Secondary | ICD-10-CM | POA: Diagnosis not present

## 2014-05-31 DIAGNOSIS — G43909 Migraine, unspecified, not intractable, without status migrainosus: Secondary | ICD-10-CM | POA: Diagnosis not present

## 2014-05-31 DIAGNOSIS — G500 Trigeminal neuralgia: Secondary | ICD-10-CM | POA: Diagnosis not present

## 2014-05-31 DIAGNOSIS — R51 Headache: Secondary | ICD-10-CM

## 2014-05-31 DIAGNOSIS — Q822 Mastocytosis: Secondary | ICD-10-CM | POA: Diagnosis not present

## 2014-05-31 DIAGNOSIS — I951 Orthostatic hypotension: Secondary | ICD-10-CM | POA: Diagnosis not present

## 2014-05-31 DIAGNOSIS — Z8709 Personal history of other diseases of the respiratory system: Secondary | ICD-10-CM | POA: Insufficient documentation

## 2014-05-31 DIAGNOSIS — Q796 Ehlers-Danlos syndrome: Secondary | ICD-10-CM | POA: Diagnosis not present

## 2014-05-31 MED ORDER — HYDROMORPHONE HCL 1 MG/ML IJ SOLN
1.0000 mg | Freq: Once | INTRAMUSCULAR | Status: AC
  Administered 2014-05-31: 1 mg via INTRAVENOUS
  Filled 2014-05-31: qty 1

## 2014-05-31 MED ORDER — SODIUM CHLORIDE 0.9 % IV BOLUS (SEPSIS)
1000.0000 mL | Freq: Once | INTRAVENOUS | Status: AC
  Administered 2014-05-31: 1000 mL via INTRAVENOUS

## 2014-05-31 MED ORDER — DEXAMETHASONE SODIUM PHOSPHATE 10 MG/ML IJ SOLN
10.0000 mg | Freq: Once | INTRAMUSCULAR | Status: AC
  Administered 2014-05-31: 10 mg via INTRAVENOUS
  Filled 2014-05-31: qty 1

## 2014-05-31 MED ORDER — METOCLOPRAMIDE HCL 5 MG/ML IJ SOLN
10.0000 mg | Freq: Once | INTRAMUSCULAR | Status: AC
  Administered 2014-05-31: 10 mg via INTRAVENOUS
  Filled 2014-05-31: qty 2

## 2014-05-31 MED ORDER — DIPHENHYDRAMINE HCL 50 MG/ML IJ SOLN
25.0000 mg | Freq: Once | INTRAMUSCULAR | Status: AC
  Administered 2014-05-31: 25 mg via INTRAVENOUS
  Filled 2014-05-31: qty 1

## 2014-05-31 NOTE — Procedures (Signed)
[facility]  EEG (ELECTROENCEPHALOGRAM) REPORT   STUDY DATE:   05-27-2014  PATIENT NAME: Jamie Baker   1970/10/24  MRN: [id number]   ORDERING CLINICIAN: Alver Fisher , MD   TECHNOLOGIST:  Ronnald Ramp TECHNIQUE: Electroencephalogram was recorded utilizing standard 10-20 system of lead placement and reformatted into average and bipolar montages.      RECORDING TIME:  31 minutes   ACTIVATION:  strobe lights and hyperventilation    CLINICAL INFORMATION:   Spells of twitching, staring, speech arrest.      FINDINGS: EEG  Background rhythm of  10 hertz . The  EKG - 72 bpm in NSR.   Photic stimulation caused entrainment at all frequencies, but no epileptiform discharges, periodic changes. Patient recorded in the awake/ drowsy state but not asleep.  Hyperventilation did not lead to amplitude built up and no focal slowing was noted.   IMPRESSION:  EEG is normal for age and state of awareness. The patient has excessive eye blink artefact during photic stimulation. There was excessive beta fast activity noted, likely related to medication. Epileptiform changes were not seen.         Larey Seat , MD

## 2014-05-31 NOTE — ED Provider Notes (Addendum)
CSN: [id number]     Arrival date & time 05/31/14  5643 History   First MD Initiated Contact with Patient 05/31/14 [phone]     Chief Complaint  Patient presents with  . Headache     (Consider location/radiation/quality/duration/timing/severity/associated sxs/prior Treatment) HPI  This is a 44 year old female with a history of Chiari malformation and migraines. She states she has daily headaches due to the Chiari malformation which she rates as a 6 out of 10. She she is able to tolerate her daily headaches. She is here with a superimposed migraine headache which is been present for 2-3 days. The pain is rated as severe and is located diffusely. It is characterized as like previous migraines. It is associated with nausea but no vomiting. It is worse with exposure to light or sounds. She has no acute neurologic changes associated with it. The pain is been severe enough at times to cause her to pass out. She has taken Dilaudid and Zofran at home without relief.  Past Medical History  Diagnosis Date  . Chiari I malformation   . Chicken pox   . Bronchitis   . Migraine   . Allergy   . GERD (gastroesophageal reflux disease)   . POTS (postural orthostatic tachycardia syndrome)   . C. difficile diarrhea   . Syncope   . Trigeminal neuralgia   . Ehlers-Danlos syndrome   . Mast cell disease   . Spina bifida occulta   . Other abnormality of brain or central nervous system function study     cranial cervical instability   Past Surgical History  Procedure Laterality Date  . Craniectomy suboccipital w/ cervical laminectomy / chiari      pt has metal in her neck  . Abdominal hysterectomy  2011  . Tonsillectomy and adenoidectomy  1975  . Tubal ligation  1989  . Achilles tendon lengthening  2009  . Achilles tendon repair  2010  . Chairi decompasion  2013  . Spinal cord surgery    . Acdf  2014    5,6,7    Family History  Problem Relation Age of Onset  . Arthritis Mother   . Breast cancer  Mother   . Stroke Mother   . Hyperlipidemia Father   . Hypertension Father   . Diabetes Father   . Brain cancer Father   . Breast cancer Maternal Aunt   . Arthritis Maternal Grandmother   . Breast cancer Maternal Grandmother   . Stroke Maternal Grandfather   . Heart disease Maternal Grandfather   . Colon cancer Neg Hx   . Esophageal cancer Neg Hx   . Rectal cancer Neg Hx   . Stomach cancer Neg Hx    History  Substance Use Topics  . Smoking status: Never Smoker   . Smokeless tobacco: Never Used  . Alcohol Use: No     Comment: occasionally   OB History    No data available     Review of Systems  All other systems reviewed and are negative.   Allergies  Levaquin; Gabapentin; and Zoloft    Home Medications   Prior to Admission medications   Medication Sig Start Date End Date Taking? Authorizing Provider  acetaZOLAMIDE (DIAMOX) 250 MG tablet 250 mg daily. 12/16/13   Historical Provider, MD  bethanechol (URECHOLINE) 10 MG tablet 10 mg daily. 12/05/13   Historical Provider, MD  butalbital-acetaminophen-caffeine (FIORICET) 50-325-40 MG per tablet Take by mouth 2 (two) times daily as needed for headache. 1-2 tablets every 4 hours  as needed for headaches.    Historical Provider, MD  carbamazepine (TEGRETOL) 200 MG tablet TAKE 1 TABLET BY MOUTH TWICE DAILY 05/04/14   Midge Minium, MD  cetirizine (ZYRTEC) 10 MG tablet Take 10 mg by mouth daily.    Historical Provider, MD  fludrocortisone (FLORINEF) 0.1 MG tablet Take 0.1 mg by mouth daily as needed.    Historical Provider, MD  midodrine (PROAMATINE) 10 MG tablet Take 1 tablet (10 mg total) by mouth 3 (three) times daily. 06/29/13   Midge Minium, MD  ondansetron (ZOFRAN) 4 MG tablet Take 4 mg by mouth every 8 (eight) hours as needed for nausea or vomiting.  07/15/13   Historical Provider, MD  promethazine (PHENERGAN) 25 MG tablet Take 1 tablet (25 mg total) by mouth every 6 (six) hours as needed for nausea or vomiting.  07/21/13   Midge Minium, MD  propranolol (INDERAL) 20 MG tablet Take 20 mg by mouth daily. 06/28/13   Midge Minium, MD  ranitidine (ZANTAC) 150 MG tablet Take 150 mg by mouth daily.    Historical Provider, MD   BP 113/77 mmHg  Pulse 91  Temp(Src) 98.5 F (36.9 C) (Oral)  Resp 16  SpO2 100%   Physical Exam  General: Well-developed, well-nourished female in no acute distress; appearance consistent with age of record HENT: normocephalic; atraumatic Eyes: pupils equal, round and reactive to light; extraocular muscles intact Neck: supple Heart: regular rate and rhythm Lungs: clear to auscultation bilaterally Abdomen: soft; nondistended; nontender; no masses or hepatosplenomegaly; bowel sounds present Extremities: No deformity; full range of motion; pulses normal Neurologic: Awake, alert and oriented; motor function intact in all extremities and symmetric; no facial droop Skin: Warm and dry Psychiatric: Flat affect    ED Course  Procedures (including critical care time)   MDM  6:52 AM Significant relief with IV medications and IV fluid bolus. She states she does not need any prescriptions.  Wynetta Fines, MD 05/31/14 [place], MD 05/31/14 [phone]

## 2014-05-31 NOTE — ED Notes (Signed)
Patient reports that she has a Headache everyday, but this feels like a MHA on top of her regular Headaches that she has. Her friend reports that she passed out 3 -4 times yesterday due to the pain

## 2014-06-02 NOTE — Progress Notes (Signed)
Quick Note:  I spoke to pt and EEG normal results given. Pt verbalized understanding. ______

## 2014-06-07 ENCOUNTER — Other Ambulatory Visit: Payer: Self-pay | Admitting: Family Medicine

## 2014-06-08 MED ORDER — CARBAMAZEPINE 200 MG PO TABS: 200.0000 mg | ORAL_TABLET | Freq: Two times a day (BID) | ORAL | Status: DC

## 2014-06-08 NOTE — Telephone Encounter (Signed)
Last OV 04-22-14 Tegretol last filled 04-22-14 #60 with 0

## 2014-06-08 NOTE — Telephone Encounter (Signed)
Med filled.  

## 2014-06-11 DIAGNOSIS — G932 Benign intracranial hypertension: Secondary | ICD-10-CM | POA: Insufficient documentation

## 2014-06-20 ENCOUNTER — Telehealth: Payer: Self-pay | Admitting: Family Medicine

## 2014-06-20 HISTORY — PX: LAMINOPLASTY POSTERIOR CERVICAL SPINE: SUR748

## 2014-06-20 NOTE — Telephone Encounter (Signed)
Caller name: Jamie Baker, [place] Relation to pt: self  Call back number: [phone]   Reason for call:  Pt initial surgery date was postponed pt has a surgery date now for 06/29/14. Pt is in need of  surgical clearance appoitment. Please advise .

## 2014-06-20 NOTE — Telephone Encounter (Signed)
Spoke with pt states that she has not had any changes. States that her paperwork states she needs to have a face to face w/in 30 days of her surgery. Also needs all the same labs completed as she did 04/22/14. Please advise?

## 2014-06-20 NOTE — Telephone Encounter (Signed)
Pt can have 15 minute OV to review any interval changes in history, repeat labs, and clear for surgery

## 2014-06-23 ENCOUNTER — Ambulatory Visit (INDEPENDENT_AMBULATORY_CARE_PROVIDER_SITE_OTHER): Payer: BLUE CROSS/BLUE SHIELD | Admitting: Family Medicine

## 2014-06-23 ENCOUNTER — Encounter: Payer: Self-pay | Admitting: Family Medicine

## 2014-06-23 VITALS — BP 120/80 | HR 77 | Temp 98.0°F | Resp 16 | Ht 64.5 in | Wt 188.4 lb

## 2014-06-23 DIAGNOSIS — Z01818 Encounter for other preprocedural examination: Secondary | ICD-10-CM

## 2014-06-23 LAB — BASIC METABOLIC PANEL
BUN: 11 mg/dL (ref 6–23)
CALCIUM: 9.2 mg/dL (ref 8.4–10.5)
CO2: 27 mEq/L (ref 19–32)
Chloride: 107 mEq/L (ref 96–112)
Creatinine, Ser: 0.75 mg/dL (ref 0.40–1.20)
GFR: 88.4 mL/min (ref >60.00)
Glucose, Bld: 81 mg/dL (ref 70–99)
Potassium: 3.9 mEq/L (ref 3.5–5.1)
SODIUM: 138 meq/L (ref 135–145)

## 2014-06-23 LAB — POCT URINALYSIS DIPSTICK
BILIRUBIN UA: NEGATIVE
Blood, UA: NEGATIVE
Glucose, UA: NEGATIVE
Ketones, UA: NEGATIVE
LEUKOCYTES UA: NEGATIVE
Nitrite, UA: NEGATIVE
Protein, UA: NEGATIVE
Spec Grav, UA: >=1.03
UROBILINOGEN UA: NEGATIVE
pH, UA: 6

## 2014-06-23 LAB — HEPATIC FUNCTION PANEL
ALT: 13 U/L (ref 0–35)
AST: 14 U/L (ref 0–37)
Albumin: 4.2 g/dL (ref 3.5–5.2)
Alkaline Phosphatase: 121 U/L — ABNORMAL HIGH (ref 39–117)
Bilirubin, Direct: 0 mg/dL (ref 0.0–0.3)
TOTAL PROTEIN: 7 g/dL (ref 6.0–8.3)
Total Bilirubin: 0.3 mg/dL (ref 0.2–1.2)

## 2014-06-23 LAB — CBC WITH DIFFERENTIAL/PLATELET
Basophils Absolute: 0 10*3/uL (ref 0.0–0.1)
Basophils Relative: 0.2 % (ref 0.0–3.0)
Eosinophils Absolute: 0 10*3/uL (ref 0.0–0.7)
Eosinophils Relative: 0.8 % (ref 0.0–5.0)
HCT: 39.3 % (ref 36.0–46.0)
Hemoglobin: 13.4 g/dL (ref 12.0–15.0)
Lymphocytes Relative: 18.2 % (ref 12.0–46.0)
Lymphs Abs: 1.1 10*3/uL (ref 0.7–4.0)
MCHC: 34 g/dL (ref 30.0–36.0)
MCV: 87.3 fl (ref 78.0–100.0)
MONOS PCT: 7.3 % (ref 3.0–12.0)
Monocytes Absolute: 0.4 10*3/uL (ref 0.1–1.0)
NEUTROS ABS: 4.5 10*3/uL (ref 1.4–7.7)
Neutrophils Relative %: 73.5 % (ref 43.0–77.0)
PLATELETS: 242 10*3/uL (ref 150.0–400.0)
RBC: 4.51 Mil/uL (ref 3.87–5.11)
RDW: 12.9 % (ref 11.5–15.5)
WBC: 6.1 10*3/uL (ref 4.0–10.5)

## 2014-06-23 LAB — PROTIME-INR
INR: 1 ratio (ref 0.8–1.0)
Prothrombin Time: 10.8 s (ref 9.6–13.1)

## 2014-06-23 LAB — MAGNESIUM: MAGNESIUM: 2 mg/dL (ref 1.5–2.5)

## 2014-06-23 LAB — APTT: APTT: 26.8 s (ref 23.4–32.7)

## 2014-06-23 NOTE — Progress Notes (Signed)
   Subjective:    Patient ID: Sydny Schnitzler, female    DOB: [birth year], 44 y.o.   MRN: [id number]  HPI Pre-op clearance- pt was cleared on 12/4 for spinal/neurosurgery (cervical laminoplasty).  Surgery was postponed until 2/10 after pt was already on the table.  No new medications.  No changes to health history or surgical hx since last appt.  No CP, SOB, abd pain, N/V.   Review of Systems For ROS see HPI     Objective:   Physical Exam  Constitutional: She is oriented to person, place, and time. She appears well-developed and well-nourished. No distress.  HENT:  Head: Normocephalic and atraumatic.  Eyes: Conjunctivae and EOM are normal. Pupils are equal, round, and reactive to light.  Cardiovascular: Normal rate, regular rhythm, normal heart sounds and intact distal pulses.   No murmur heard. Pulmonary/Chest: Effort normal and breath sounds normal. No respiratory distress.  Abdominal: Soft. She exhibits no distension. There is no tenderness.  Musculoskeletal: She exhibits no edema.  Lymphadenopathy:    She has no cervical adenopathy.  Neurological: She is alert and oriented to person, place, and time.  Skin: Skin is warm and dry.  Psychiatric: She has a normal mood and affect. Her behavior is normal.  Vitals reviewed.         Assessment & Plan:

## 2014-06-23 NOTE — Patient Instructions (Signed)
Follow up as needed We'll notify you of your lab results and make any changes if needed We'll fax all labs and today's note to your surgeon Call with any questions or concerns GOOD LUCK!!!

## 2014-06-23 NOTE — Progress Notes (Signed)
Pre visit review using our clinic review tool, if applicable. No additional management support is needed unless otherwise documented below in the visit note. 

## 2014-06-28 NOTE — Assessment & Plan Note (Signed)
Pt is again cleared for surgery pending labs.  Pt has had no changes in health hx or PE findings.  Repeat labs at surgeon's request.

## 2014-07-08 ENCOUNTER — Telehealth: Payer: Self-pay | Admitting: Neurology

## 2014-07-08 NOTE — Telephone Encounter (Signed)
Pt canceled her appt due to her having surgery.  dr dohmeier/referring provider was notified.

## 2014-07-12 ENCOUNTER — Ambulatory Visit: Payer: Self-pay | Admitting: Neurology

## 2014-08-08 ENCOUNTER — Other Ambulatory Visit: Payer: Self-pay | Admitting: Family Medicine

## 2014-08-08 NOTE — Telephone Encounter (Signed)
Med filled.  

## 2014-08-15 ENCOUNTER — Encounter: Payer: Self-pay | Admitting: Neurology

## 2014-08-15 ENCOUNTER — Ambulatory Visit (INDEPENDENT_AMBULATORY_CARE_PROVIDER_SITE_OTHER): Payer: BLUE CROSS/BLUE SHIELD | Admitting: Neurology

## 2014-08-15 VITALS — BP 118/75 | HR 78 | Resp 14 | Ht 64.0 in | Wt 189.8 lb

## 2014-08-15 DIAGNOSIS — G4733 Obstructive sleep apnea (adult) (pediatric): Secondary | ICD-10-CM | POA: Diagnosis not present

## 2014-08-15 DIAGNOSIS — G737 Myopathy in diseases classified elsewhere: Secondary | ICD-10-CM

## 2014-08-15 DIAGNOSIS — M791 Myalgia, unspecified site: Secondary | ICD-10-CM | POA: Insufficient documentation

## 2014-08-15 DIAGNOSIS — I498 Other specified cardiac arrhythmias: Secondary | ICD-10-CM

## 2014-08-15 DIAGNOSIS — E2740 Unspecified adrenocortical insufficiency: Secondary | ICD-10-CM

## 2014-08-15 DIAGNOSIS — R000 Tachycardia, unspecified: Secondary | ICD-10-CM

## 2014-08-15 DIAGNOSIS — E349 Endocrine disorder, unspecified: Secondary | ICD-10-CM

## 2014-08-15 DIAGNOSIS — Z9989 Dependence on other enabling machines and devices: Secondary | ICD-10-CM

## 2014-08-15 DIAGNOSIS — G90A Postural orthostatic tachycardia syndrome (POTS): Secondary | ICD-10-CM

## 2014-08-15 DIAGNOSIS — R682 Dry mouth, unspecified: Secondary | ICD-10-CM

## 2014-08-15 DIAGNOSIS — I951 Orthostatic hypotension: Secondary | ICD-10-CM | POA: Diagnosis not present

## 2014-08-15 MED ORDER — FLUDROCORTISONE ACETATE 0.1 MG PO TABS: 0.1000 mg | ORAL_TABLET | Freq: Every day | ORAL | Status: DC

## 2014-08-15 MED ORDER — CARBAMAZEPINE 200 MG PO TABS: ORAL_TABLET | ORAL | Status: DC
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
## 2014-08-16 ENCOUNTER — Encounter: Payer: Self-pay | Admitting: Neurology

## 2014-08-16 LAB — SJOGREN'S SYNDROME ANTIBODS(SSA + SSB)

## 2014-08-16 LAB — CK TOTAL AND CKMB (NOT AT ARMC)
CK TOTAL: 61 U/L (ref 24–173)
CK-MB Index: <1 ng/mL (ref 0.0–5.3)

## 2014-08-16 LAB — ANA W/REFLEX IF POSITIVE: Anti Nuclear Antibody(ANA): NEGATIVE

## 2014-08-18 NOTE — Progress Notes (Signed)
Quick Note:  I called pt and relayed that lab results normal. She had seen in mychart. ______

## 2014-08-19 ENCOUNTER — Encounter: Payer: Self-pay | Admitting: Neurology

## 2014-09-01 ENCOUNTER — Other Ambulatory Visit: Payer: Self-pay | Admitting: Family Medicine

## 2014-09-01 NOTE — Telephone Encounter (Signed)
Med filled.  

## 2014-09-12 ENCOUNTER — Ambulatory Visit (INDEPENDENT_AMBULATORY_CARE_PROVIDER_SITE_OTHER): Payer: BLUE CROSS/BLUE SHIELD | Admitting: Nurse Practitioner

## 2014-09-12 ENCOUNTER — Encounter: Payer: Self-pay | Admitting: Nurse Practitioner

## 2014-09-12 VITALS — BP 103/65 | HR 58 | Resp 20 | Ht 64.0 in | Wt 189.5 lb

## 2014-09-12 DIAGNOSIS — G4730 Sleep apnea, unspecified: Secondary | ICD-10-CM | POA: Diagnosis not present

## 2014-09-12 DIAGNOSIS — G4733 Obstructive sleep apnea (adult) (pediatric): Secondary | ICD-10-CM

## 2014-09-12 DIAGNOSIS — M791 Myalgia, unspecified site: Secondary | ICD-10-CM

## 2014-09-12 DIAGNOSIS — G4710 Hypersomnia, unspecified: Secondary | ICD-10-CM

## 2014-09-12 DIAGNOSIS — Z9989 Dependence on other enabling machines and devices: Principal | ICD-10-CM

## 2014-09-12 DIAGNOSIS — G935 Compression of brain: Secondary | ICD-10-CM

## 2014-09-12 NOTE — Patient Instructions (Signed)
Continue carbamazepine taper and discontinue as directed Continue CPAP at 9 cm of pressure Follow-up in 6 months
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
## 2014-10-14 ENCOUNTER — Telehealth: Payer: Self-pay | Admitting: *Deleted

## 2014-10-14 NOTE — Telephone Encounter (Signed)
Pt signed medical record release received via fax from [facility]. Forwarded to [place] to scan/email to medical records. JG//CMA

## 2014-10-16 ENCOUNTER — Other Ambulatory Visit: Payer: Self-pay | Admitting: Gastroenterology

## 2014-10-25 ENCOUNTER — Encounter (HOSPITAL_BASED_OUTPATIENT_CLINIC_OR_DEPARTMENT_OTHER): Payer: Self-pay

## 2014-10-25 ENCOUNTER — Telehealth: Payer: Self-pay | Admitting: Family Medicine

## 2014-10-25 ENCOUNTER — Emergency Department (HOSPITAL_BASED_OUTPATIENT_CLINIC_OR_DEPARTMENT_OTHER): Payer: BLUE CROSS/BLUE SHIELD

## 2014-10-25 ENCOUNTER — Emergency Department (HOSPITAL_BASED_OUTPATIENT_CLINIC_OR_DEPARTMENT_OTHER)
Admission: EM | Admit: 2014-10-25 | Discharge: 2014-10-25 | Disposition: A | Discharge status: Home / Self Care | Payer: BLUE CROSS/BLUE SHIELD | Attending: Emergency Medicine | Admitting: Emergency Medicine

## 2014-10-25 ENCOUNTER — Other Ambulatory Visit: Payer: Self-pay

## 2014-10-25 DIAGNOSIS — Z8619 Personal history of other infectious and parasitic diseases: Secondary | ICD-10-CM | POA: Insufficient documentation

## 2014-10-25 DIAGNOSIS — Z79899 Other long term (current) drug therapy: Secondary | ICD-10-CM | POA: Diagnosis not present

## 2014-10-25 DIAGNOSIS — Z87728 Personal history of other specified (corrected) congenital malformations of nervous system and sense organs: Secondary | ICD-10-CM | POA: Insufficient documentation

## 2014-10-25 DIAGNOSIS — Q822 Mastocytosis: Secondary | ICD-10-CM | POA: Diagnosis not present

## 2014-10-25 DIAGNOSIS — Z7952 Long term (current) use of systemic steroids: Secondary | ICD-10-CM | POA: Diagnosis not present

## 2014-10-25 DIAGNOSIS — Z8669 Personal history of other diseases of the nervous system and sense organs: Secondary | ICD-10-CM | POA: Diagnosis not present

## 2014-10-25 DIAGNOSIS — R079 Chest pain, unspecified: Secondary | ICD-10-CM | POA: Diagnosis not present

## 2014-10-25 DIAGNOSIS — R55 Syncope and collapse: Secondary | ICD-10-CM

## 2014-10-25 DIAGNOSIS — R51 Headache: Secondary | ICD-10-CM | POA: Insufficient documentation

## 2014-10-25 DIAGNOSIS — Q796 Ehlers-Danlos syndrome: Secondary | ICD-10-CM | POA: Insufficient documentation

## 2014-10-25 DIAGNOSIS — Z8709 Personal history of other diseases of the respiratory system: Secondary | ICD-10-CM | POA: Diagnosis not present

## 2014-10-25 DIAGNOSIS — R519 Headache, unspecified: Secondary | ICD-10-CM

## 2014-10-25 DIAGNOSIS — K219 Gastro-esophageal reflux disease without esophagitis: Secondary | ICD-10-CM | POA: Insufficient documentation

## 2014-10-25 LAB — URINALYSIS, ROUTINE W REFLEX MICROSCOPIC
BILIRUBIN URINE: NEGATIVE
Glucose, UA: NEGATIVE mg/dL
HGB URINE DIPSTICK: NEGATIVE
Ketones, ur: NEGATIVE mg/dL
Leukocytes, UA: NEGATIVE
NITRITE: NEGATIVE
PH: 7 (ref 5.0–8.0)
Protein, ur: NEGATIVE mg/dL
Specific Gravity, Urine: 1.005 (ref 1.005–1.030)
UROBILINOGEN UA: 0.2 mg/dL (ref 0.0–1.0)

## 2014-10-25 LAB — CBC WITH DIFFERENTIAL/PLATELET
Basophils Absolute: 0 10*3/uL (ref 0.0–0.1)
Basophils Relative: 0 % (ref 0–1)
EOS ABS: 0.1 10*3/uL (ref 0.0–0.7)
Eosinophils Relative: 1 % (ref 0–5)
HCT: 41.4 % (ref 36.0–46.0)
HEMOGLOBIN: 13.3 g/dL (ref 12.0–15.0)
Lymphocytes Relative: 24 % (ref 12–46)
Lymphs Abs: 1.1 10*3/uL (ref 0.7–4.0)
MCH: 28.1 pg (ref 26.0–34.0)
MCHC: 32.1 g/dL (ref 30.0–36.0)
MCV: 87.3 fL (ref 78.0–100.0)
Monocytes Absolute: 0.6 10*3/uL (ref 0.1–1.0)
Monocytes Relative: 12 % (ref 3–12)
Neutro Abs: 2.9 10*3/uL (ref 1.7–7.7)
Neutrophils Relative %: 63 % (ref 43–77)
PLATELETS: 217 10*3/uL (ref 150–400)
RBC: 4.74 MIL/uL (ref 3.87–5.11)
RDW: 13.2 % (ref 11.5–15.5)
WBC: 4.6 10*3/uL (ref 4.0–10.5)

## 2014-10-25 LAB — COMPREHENSIVE METABOLIC PANEL
ALT: 17 U/L (ref 14–54)
AST: 19 U/L (ref 15–41)
Albumin: 4.4 g/dL (ref 3.5–5.0)
Alkaline Phosphatase: 95 U/L (ref 38–126)
Anion gap: 7 (ref 5–15)
BUN: 8 mg/dL (ref 6–20)
CALCIUM: 9.2 mg/dL (ref 8.9–10.3)
CO2: 28 mmol/L (ref 22–32)
Chloride: 105 mmol/L (ref 101–111)
Creatinine, Ser: 0.75 mg/dL (ref 0.44–1.00)
GFR calc non Af Amer: >60 mL/min (ref >60)
GLUCOSE: 91 mg/dL (ref 65–99)
Potassium: 3.9 mmol/L (ref 3.5–5.1)
SODIUM: 140 mmol/L (ref 135–145)
Total Bilirubin: 0.6 mg/dL (ref 0.3–1.2)
Total Protein: 7.3 g/dL (ref 6.5–8.1)

## 2014-10-25 LAB — TROPONIN I: Troponin I: <0.03 ng/mL (ref <0.031)

## 2014-10-25 MED ORDER — SODIUM CHLORIDE 0.9 % IV BOLUS (SEPSIS)
1000.0000 mL | INTRAVENOUS | Status: AC
  Administered 2014-10-25: 1000 mL via INTRAVENOUS

## 2014-10-25 MED ORDER — DIPHENHYDRAMINE HCL 50 MG/ML IJ SOLN
25.0000 mg | Freq: Once | INTRAMUSCULAR | Status: AC
  Administered 2014-10-25: 25 mg via INTRAVENOUS
  Filled 2014-10-25: qty 1

## 2014-10-25 MED ORDER — ONDANSETRON HCL 4 MG/2ML IJ SOLN
4.0000 mg | Freq: Once | INTRAMUSCULAR | Status: AC
  Administered 2014-10-25: 4 mg via INTRAVENOUS
  Filled 2014-10-25: qty 2

## 2014-10-25 MED ORDER — METOCLOPRAMIDE HCL 5 MG/ML IJ SOLN
10.0000 mg | Freq: Once | INTRAMUSCULAR | Status: AC
  Administered 2014-10-25: 10 mg via INTRAVENOUS
  Filled 2014-10-25: qty 2

## 2014-10-25 MED ORDER — DEXAMETHASONE SODIUM PHOSPHATE 10 MG/ML IJ SOLN
10.0000 mg | Freq: Once | INTRAMUSCULAR | Status: AC
  Administered 2014-10-25: 10 mg via INTRAVENOUS
  Filled 2014-10-25: qty 1

## 2014-10-25 MED ORDER — HYDROMORPHONE HCL 1 MG/ML IJ SOLN
1.0000 mg | Freq: Once | INTRAMUSCULAR | Status: AC
  Administered 2014-10-25: 1 mg via INTRAVENOUS
  Filled 2014-10-25: qty 1

## 2014-10-25 NOTE — ED Provider Notes (Signed)
CSN: [id number]     Arrival date & time 10/25/14  1028 History   First MD Initiated Contact with Patient 10/25/14 1032     Chief Complaint  Patient presents with  . Hypotension     pt reports b/p at home 90/70, low pulse 40's 50's. C/o pressure in head. b/p 131/85 on admission     (Consider location/radiation/quality/duration/timing/severity/associated sxs/prior Treatment) Patient is a 44 y.o. female presenting with headaches. The history is provided by the patient.  Headache Pain location:  Generalized Quality:  Dull Radiates to:  Does not radiate Severity currently:  8/10 Severity at highest:  10/10 Onset quality:  Gradual Timing:  Constant Progression:  Unchanged Chronicity:  Chronic Context comment:  S/p neck surgery in Feb Relieved by:  Nothing Worsened by:  Nothing Ineffective treatments: Neurontin, Dilaudid, Tylenol. Associated symptoms: no abdominal pain, no back pain, no congestion, no diarrhea, no dizziness, no eye pain, no fatigue, no fever, no nausea, no neck pain and no vomiting     Past Medical History  Diagnosis Date  . Chiari I malformation   . Chicken pox   . Bronchitis   . Migraine   . Allergy   . GERD (gastroesophageal reflux disease)   . POTS (postural orthostatic tachycardia syndrome)   . C. difficile diarrhea   . Syncope   . Trigeminal neuralgia   . Ehlers-Danlos syndrome   . Mast cell disease   . Spina bifida occulta   . Other abnormality of brain or central nervous system function study     cranial cervical instability   Past Surgical History  Procedure Laterality Date  . Craniectomy suboccipital w/ cervical laminectomy / chiari      pt has metal in her neck  . Abdominal hysterectomy  2011  . Tonsillectomy and adenoidectomy  1975  . Tubal ligation  1989  . Achilles tendon lengthening  2009  . Achilles tendon repair  2010  . Chairi decompasion  2013  . Spinal cord surgery    . Acdf  2014    5,6,7   . Laminoplasty posterior cervical  spine  06/2014    C2-C4   Family History  Problem Relation Age of Onset  . Arthritis Mother   . Breast cancer Mother   . Stroke Mother   . Hyperlipidemia Father   . Hypertension Father   . Diabetes Father   . Brain cancer Father   . Breast cancer Maternal Aunt   . Arthritis Maternal Grandmother   . Breast cancer Maternal Grandmother   . Stroke Maternal Grandfather   . Heart disease Maternal Grandfather   . Colon cancer Neg Hx   . Esophageal cancer Neg Hx   . Rectal cancer Neg Hx   . Stomach cancer Neg Hx    History  Substance Use Topics  . Smoking status: Never Smoker   . Smokeless tobacco: Never Used  . Alcohol Use: No     Comment: occasionally   OB History    No data available     Review of Systems  Constitutional: Negative for fever and fatigue.  HENT: Negative for congestion and drooling.   Eyes: Negative for pain.  Respiratory: Negative for shortness of breath.   Cardiovascular: Positive for chest pain.  Gastrointestinal: Negative for nausea, vomiting, abdominal pain and diarrhea.  Genitourinary: Negative for dysuria and hematuria.  Musculoskeletal: Negative for back pain, gait problem and neck pain.  Skin: Negative for color change.  Neurological: Positive for syncope and headaches. Negative for  dizziness.  Hematological: Negative for adenopathy.  Psychiatric/Behavioral: Negative for behavioral problems.  All other systems reviewed and are negative.     Allergies  Levaquin; Gabapentin; and Zoloft  Home Medications   Prior to Admission medications   Medication Sig Start Date End Date Taking? Authorizing Provider  bethanechol (URECHOLINE) 10 MG tablet 10 mg daily. 12/05/13   Historical Provider, MD  butalbital-acetaminophen-caffeine (FIORICET) 50-325-40 MG per tablet Take by mouth 2 (two) times daily as needed for headache. 1-2 tablets every 4 hours as needed for headaches.    Historical Provider, MD  carbamazepine (TEGRETOL) 200 MG tablet Take 1 tablet  at bedtime by mouth for 30 days. After 30 days take 200 mg every other day for 14 days then discontinue the medication. 08/15/14   Asencion Partridge Dohmeier, MD  cetirizine (ZYRTEC) 10 MG tablet Take 10 mg by mouth daily.    Historical Provider, MD  cloNIDine (CATAPRES) 0.1 MG tablet Take 0.1 mg by mouth. Takes every 3 hours. 08/08/14   Historical Provider, MD  fludrocortisone (FLORINEF) 0.1 MG tablet Take 1 tablet (0.1 mg total) by mouth daily. 08/15/14   Asencion Partridge Dohmeier, MD  midodrine (PROAMATINE) 10 MG tablet TAKE 1 TABLET BY MOUTH THREE TIMES DAILY 09/01/14   Midge Minium, MD  ondansetron (ZOFRAN) 4 MG tablet Take 4 mg by mouth every 8 (eight) hours as needed for nausea or vomiting.  07/15/13   Historical Provider, MD  pantoprazole (PROTONIX) 40 MG tablet  06/14/14   Historical Provider, MD  pantoprazole (PROTONIX) 40 MG tablet TAKE 1 TABLET BY MOUTH TWICE DAILY 10/18/14   Ladene Artist, MD  promethazine (PHENERGAN) 25 MG tablet Take 1 tablet (25 mg total) by mouth every 6 (six) hours as needed for nausea or vomiting. 07/21/13   Midge Minium, MD  propranolol (INDERAL) 20 MG tablet Take 20 mg by mouth daily. 06/28/13   Midge Minium, MD  ranitidine (ZANTAC) 150 MG tablet Take 150 mg by mouth daily.    Historical Provider, MD   BP 131/85 mmHg  Pulse 77  Temp(Src) 98.1 F (36.7 C) (Oral)  Resp 16  Ht 5\' 4"  (1.626 m)  Wt 185 lb (83.915 kg)  BMI 31.74 kg/m2  SpO2 100% Physical Exam  Constitutional: She is oriented to person, place, and time. She appears well-developed and well-nourished.  HENT:  Head: Normocephalic and atraumatic.  Mouth/Throat: Oropharynx is clear and moist. No oropharyngeal exudate.  Eyes: Conjunctivae and EOM are normal. Pupils are equal, round, and reactive to light.  Neck: Normal range of motion. Neck supple.  Surgical site on posterior aspect of neck is well-healed, clean, dry, intact.  Cardiovascular: Normal rate, regular rhythm, normal heart sounds and intact  distal pulses.  Exam reveals no gallop and no friction rub.   No murmur heard. Pulmonary/Chest: Effort normal and breath sounds normal. No respiratory distress. She has no wheezes.  Abdominal: Soft. Bowel sounds are normal. There is no tenderness. There is no rebound and no guarding.  Musculoskeletal: Normal range of motion. She exhibits no edema or tenderness.  Neurological: She is alert and oriented to person, place, and time.  alert, oriented x3 speech: normal in context and clarity memory: intact grossly cranial nerves II-XII: intact motor strength: full proximally and distally no involuntary movements or tremors sensation: intact to light touch diffusely  cerebellar: finger-to-nose and heel-to-shin intact gait: normal forwards and backwards  Skin: Skin is warm and dry.  Psychiatric: She has a normal mood and affect. Her  behavior is normal.  Nursing note and vitals reviewed.   ED Course  Procedures (including critical care time) Labs Review Labs Reviewed  CBC WITH DIFFERENTIAL/PLATELET  COMPREHENSIVE METABOLIC PANEL  TROPONIN I  URINALYSIS, ROUTINE W REFLEX MICROSCOPIC (NOT AT [facility])    Imaging Review Dg Chest 2 View  10/25/2014   CLINICAL DATA:  Headache for months, chest pain for a few days, history GERD, Ehlers-Danlos syndrome  EXAM: CHEST  2 VIEW  COMPARISON:  08/31/2013  FINDINGS: Normal heart size, mediastinal contours and pulmonary vascularity.  Lungs clear.  Minimal chronic peribronchial thickening.  No pleural effusion or pneumothorax.  Prior cervicothoracic fusion.  IMPRESSION: No acute abnormalities.   Electronically Signed   By: Lavonia Dana M.D.   On: 10/25/2014 12:09     EKG Interpretation   Date/Time:  Tuesday October 25 2014 11:44:49 EDT Ventricular Rate:  63 PR Interval:  118 QRS Duration: 84 QT Interval:  420 QTC Calculation: 429 R Axis:   62 Text Interpretation:  Normal sinus rhythm Low voltage QRS Confirmed by  Moani Weipert  MD, Millissa Deese ([id number]) on 10/25/2014  12:09:08 PM      MDM   Final diagnoses:  Chest pain  Headache, unspecified headache type  Syncope, unspecified syncope type    11:25 AM 44 y.o. female s/p cervical laminoplasty in February of this year, pots syndrome, trigeminal neuralgia, Eller's Danlos syndrome who presents with multiple complaints. She states that she has had chronic pressure in her head since the surgery. The pain seems to be worse on the left. She denies any vomiting, fevers, head injuries. She states that she has had systolic blood pressures in the 90s over the last 1.5 weeks and has felt lightheaded with occasional syncope upon standing. This is consistent with her diagnosis of pots syndrome. She also states that she has had some chest pressure which developed 2-3 days ago and has been intermittent since then lasting hours at a time. Her vital signs are unremarkable here and she has a normal neurologic exam. Headache is currently 8 out of 10.  2:24 PM: I interpreted/reviewed the labs and/or imaging which were non-contributory.  Pt continues to appear well. Low risk for MACE per HEART score. Pt feeling much better.  I have discussed the diagnosis/risks/treatment options with the patient and believe the pt to be eligible for discharge home to follow-up with her pcp and neurosurgeon. We also discussed returning to the ED immediately if new or worsening sx occur. We discussed the sx which are most concerning (e.g., worsening HA, fever, recurrent syncope) that necessitate immediate return. Medications administered to the patient during their visit and any new prescriptions provided to the patient are listed below.  Medications given during this visit Medications  sodium chloride 0.9 % bolus 1,000 mL (0 mLs Intravenous Stopped 10/25/14 1310)  metoCLOPramide (REGLAN) injection 10 mg (10 mg Intravenous Given 10/25/14 1201)  diphenhydrAMINE (BENADRYL) injection 25 mg (25 mg Intravenous Given 10/25/14 1201)  dexamethasone (DECADRON)  injection 10 mg (10 mg Intravenous Given 10/25/14 1202)  sodium chloride 0.9 % bolus 1,000 mL (1,000 mLs Intravenous New Bag/Given 10/25/14 1311)  HYDROmorphone (DILAUDID) injection 1 mg (1 mg Intravenous Given 10/25/14 1318)  ondansetron (ZOFRAN) injection 4 mg (4 mg Intravenous Given 10/25/14 1314)    New Prescriptions   No medications on file     Pamella Pert, MD 10/25/14 1459

## 2014-10-25 NOTE — ED Notes (Signed)
Pt also c/o ringing in  ears, pressure intermittent, states can feel heart beating, "pulsating " in ears.

## 2014-10-25 NOTE — Telephone Encounter (Signed)
Patient arrived at [facility] ED.

## 2014-10-25 NOTE — Telephone Encounter (Signed)
Patient Name: Jamie Baker DOB: [birth year] Initial Comment Caller states he has headache, low bp, high pulse. BP 90/70, pulse 78, dizzy. Nurse Assessment Nurse: Marcelline Deist, RN, Lynda Date/Time (Eastern Time): 10/25/2014 9:03:39 AM Confirm and document reason for call. If symptomatic, describe symptoms. ---Caller states she has had constant headache since the end of April., low BP, high pulse. BP 90/70, pulse 78, dizzy. Is on Clonidine, but can't take it d/t her BP. Her neurologist can't see her until Sept. Had a cranial laminoplasty done in Feb. of C2, C3 & C4. Has the patient traveled out of the country within the last 30 days? ---Not Applicable Does the patient require triage? ---Yes Related visit to physician within the last 2 weeks? ---No Does the PT have any chronic conditions? (i.e. diabetes, asthma, etc.) ---Yes List chronic conditions. ---several, LS Densle syndrome (sp?), mass cell activation disorder. Did the patient indicate they were pregnant? ---No Guidelines Guideline Title Affirmed Question Affirmed Notes Headache Loss of vision or double vision (Exception: same as prior migraines) Final Disposition User Go to ED Now (or PCP triage) Marcelline Deist, RN, Lynda Comments Caller listed several chronic medical conditions for nurse that were not documented.

## 2014-10-25 NOTE — ED Notes (Signed)
Pt reports b/p at home is low, she has not been able to take clonadine,  She had C- 2.3.4. Laminoplasty in 06/2014,  She is feeling 'pressure' at the incision site,  Incision healed, appears WNL

## 2014-10-31 ENCOUNTER — Encounter: Payer: Self-pay | Admitting: Family Medicine

## 2014-10-31 DIAGNOSIS — R51 Headache: Principal | ICD-10-CM

## 2014-10-31 DIAGNOSIS — R519 Headache, unspecified: Secondary | ICD-10-CM

## 2014-10-31 NOTE — Telephone Encounter (Signed)
Referral placed.

## 2014-11-03 ENCOUNTER — Encounter: Payer: Self-pay | Admitting: Neurology

## 2014-11-03 ENCOUNTER — Ambulatory Visit (INDEPENDENT_AMBULATORY_CARE_PROVIDER_SITE_OTHER): Payer: BLUE CROSS/BLUE SHIELD | Admitting: Neurology

## 2014-11-03 VITALS — BP 127/82 | HR 75 | Ht 64.0 in | Wt 178.0 lb

## 2014-11-03 DIAGNOSIS — G43009 Migraine without aura, not intractable, without status migrainosus: Secondary | ICD-10-CM | POA: Diagnosis not present

## 2014-11-03 DIAGNOSIS — G935 Compression of brain: Secondary | ICD-10-CM | POA: Diagnosis not present

## 2014-11-03 MED ORDER — RIZATRIPTAN BENZOATE 10 MG PO TBDP: 10.0000 mg | ORAL_TABLET | ORAL | Status: DC | PRN

## 2014-11-03 MED ORDER — NORTRIPTYLINE HCL 10 MG PO CAPS: ORAL_CAPSULE | ORAL | Status: DC

## 2014-11-03 NOTE — Progress Notes (Signed)
PATIENT: Jamie Baker DOB: [birth year]  Chief Complaint  Patient presents with  . Headache    Reports having daily headaches where she is feeling intense pressure.  These headaches are accompanied by nausea, swooshing sounds in her ears and vision changes in her left eye.  She also has soreness to touch on the left side and back of her head.  The headaches usually do not respond to medications.      HISTORICAL  Jamie Baker is a 44 yo RH female, seen in prefer by her primary care physician Dr. Birdie Riddle for evaluation of severe headaches, she is accompanied by her mother at today's clinical visit.  She had a history of chronic headaches, MRI of the brain in October 2012 showed Arnold-Chiari malformation, she eventually underwent posterior craniotomy, upper cervical laminectomy in February 2013 by [facility] neurosurgeon Dr. Gloriann Loan, which fail to improve her symptoms, she continue complains of severe daily headaches  She had seek specialist at [place], had ACDF C5-6-7 by neurosurgeon at [facility] Dr. Haydee Monica in June 2014, for symptoms of frequent headaches, neck pain, bilateral upper and lower extremity paresthesia, again surgery failed to help her symptoms.  February 2016, she had a posterior cervical 2, 3 4, laminoplasty for cranial cervical instability by Dr. Haydee Monica  She also had a tethered spinal cord release surgery in the past, was under the care off in [facility] neurologists, received multiple cervical, lumbar epidural injection for her complains of chronic neck, low back pain, chronic headaches, without control her symptoms.  She had a history of POTS, is taking Midodrine, also clonidine, and Inderal  Post surgically, she continued to complains significant daily headaches, today she presented with 3 weeks history of 10 out of 10 holocranial pressure headaches, is associated light noise sensitivity, nauseous, which has put her bed most of the days.   Over the  years, she tried different medications, Topamax did not help her headaches,  She has tried multiple abortive treatment over the past few weeks, Fioricet, Dilaudid, oxycodone, hydrocodone Norco, failed to improve her headaches,   We have reviewed MRI of brain in September 2015,Prior suboccipital craniectomy for Chiari I malformation without residual tonsillar herniation. There is a subcentimeter area of differential enhancement in the RIGHT division of the pituitary which could represent a small microadenoma  REVIEW OF SYSTEMS: Full 14 system review of systems performed and notable only for fever, chill, ringing ears, loss of vision, eye pain, snoring, easy bruising, headaches, numbness, slurred speech, dizziness, passing out, snoring  ALLERGIES: Allergies  Allergen Reactions  . Levaquin [Levofloxacin In D5w]     Ruptured ache lis tendons    . Gabapentin Other (See Comments)    tremors  . Zoloft [Sertraline Hcl] Other (See Comments)    hallucinations    HOME MEDICATIONS: Current Outpatient Prescriptions  Medication Sig Dispense Refill  . bethanechol (URECHOLINE) 10 MG tablet 10 mg daily.    . butalbital-acetaminophen-caffeine (FIORICET) 50-325-40 MG per tablet Take by mouth 2 (two) times daily as needed for headache. 1-2 tablets every 4 hours as needed for headaches.    . cetirizine (ZYRTEC) 10 MG tablet Take 10 mg by mouth daily.    . cloNIDine (CATAPRES) 0.1 MG tablet Take 0.1 mg by mouth. Takes every 3 hours.  4  . fludrocortisone (FLORINEF) 0.1 MG tablet Take 1 tablet (0.1 mg total) by mouth daily. 60 tablet 5  . midodrine (PROAMATINE) 10 MG tablet TAKE 1 TABLET BY MOUTH THREE TIMES DAILY 90 tablet  3  . ondansetron (ZOFRAN) 4 MG tablet Take 4 mg by mouth every 8 (eight) hours as needed for nausea or vomiting.     . pantoprazole (PROTONIX) 40 MG tablet TAKE 1 TABLET BY MOUTH TWICE DAILY 60 tablet 0  . promethazine (PHENERGAN) 25 MG tablet Take 1 tablet (25 mg total) by mouth every 6  (six) hours as needed for nausea or vomiting. 30 tablet 0  . propranolol (INDERAL) 20 MG tablet Take 20 mg by mouth daily.    . ranitidine (ZANTAC) 150 MG tablet Take 150 mg by mouth daily.     No current facility-administered medications for this visit.    PAST MEDICAL HISTORY: Past Medical History  Diagnosis Date  . Chiari I malformation   . Chicken pox   . Bronchitis   . Migraine   . Allergy   . GERD (gastroesophageal reflux disease)   . POTS (postural orthostatic tachycardia syndrome)   . C. difficile diarrhea   . Syncope   . Trigeminal neuralgia   . Ehlers-Danlos syndrome   . Mast cell disease   . Spina bifida occulta   . Other abnormality of brain or central nervous system function study     cranial cervical instability  . Sleep apnea with use of continuous positive airway pressure (CPAP)   . Scoliosis   . Raynaud's syndrome     PAST SURGICAL HISTORY: Past Surgical History  Procedure Laterality Date  . Craniectomy suboccipital w/ cervical laminectomy / chiari      pt has metal in her neck  . Abdominal hysterectomy  2011  . Tonsillectomy and adenoidectomy  1975  . Tubal ligation  1989  . Achilles tendon lengthening  2009  . Achilles tendon repair  2010  . Chairi decompasion  2013  . Spinal cord surgery    . Acdf  2014    5,6,7   . Laminoplasty posterior cervical spine  06/2014    C2-C4    FAMILY HISTORY: Family History  Problem Relation Age of Onset  . Arthritis Mother   . Breast cancer Mother   . Stroke Mother   . Hyperlipidemia Father   . Hypertension Father   . Diabetes Father   . Brain cancer Father   . Breast cancer Maternal Aunt   . Arthritis Maternal Grandmother   . Breast cancer Maternal Grandmother   . Stroke Maternal Grandfather   . Heart disease Maternal Grandfather   . Colon cancer Neg Hx   . Esophageal cancer Neg Hx   . Rectal cancer Neg Hx   . Stomach cancer Neg Hx     SOCIAL HISTORY:  History   Social History  . Marital  Status: Married    Spouse Name: N/A  . Number of Children: 3  . Years of Education: MBA   Occupational History  . Accountant    Social History Main Topics  . Smoking status: Never Smoker   . Smokeless tobacco: Never Used  . Alcohol Use: 0.0 oz/week    0 Standard drinks or equivalent per week     Comment: Rarely  . Drug Use: No  . Sexual Activity: Not on file   Other Topics Concern  . Not on file   Social History Narrative   Right handed.  Work- Public house manager, Married, 3 kids.     Caffeine 1 glass daily.     PHYSICAL EXAM   Filed Vitals:   11/03/14 0840  BP: 127/82  Pulse: 75  Height: 5\' 4"  (  1.626 m)  Weight: 178 lb (80.74 kg)    Not recorded      Body mass index is 30.54 kg/(m^2).  PHYSICAL EXAMNIATION:  Gen: NAD, conversant, well nourised, obese, well groomed                     Cardiovascular: Regular rate rhythm, no peripheral edema, warm, nontender. Eyes: Conjunctivae clear without exudates or hemorrhage Neck: Supple, no carotid bruise. Pulmonary: Clear to auscultation bilaterally   NEUROLOGICAL EXAM:  MENTAL STATUS: Speech:    Speech is normal; fluent and spontaneous with normal comprehension.  Cognition:    The patient is oriented to person, place, and time;     recent and remote memory intact;     language fluent;     normal attention, concentration,     fund of knowledge.  CRANIAL NERVES: CN II: Visual fields are full to confrontation. Fundoscopic exam is normal. Pupils are 4 mm and briskly reactive to light. Visual acuity is 20/20 bilaterally. CN III, IV, VI: extraocular movement are normal. No ptosis. CN V: Facial sensation is intact to pinprick in all 3 divisions bilaterally. Corneal responses are intact.  CN VII: Face is symmetric with normal eye closure and smile. CN VIII: Hearing is normal to rubbing fingers CN IX, X: Palate elevates symmetrically. Phonation is normal. CN XI: Head turning and shoulder shrug are intact CN XII: Tongue  is midline with normal movements and no atrophy.  MOTOR: There is no pronator drift of out-stretched arms. Muscle bulk and tone are normal. Muscle strength is normal.Limited range of motion of her neck movements   REFLEXES: Reflexes are 2+ and symmetric at the biceps, triceps, knees, and ankles. Plantar responses are flexor.  SENSORY: Light touch, pinprick, position sense, and vibration sense are intact in fingers and toes.  COORDINATION: Rapid alternating movements and fine finger movements are intact. There is no dysmetria on finger-to-nose and heel-knee-shin. There are no abnormal or extraneous movements.   GAIT/STANCE: Posture is normal. Gait is steady with normal steps, base, arm swing, and turning. Heel and toe walking are normal. Tandem gait is normal.  Romberg is absent.   DIAGNOSTIC DATA (LABS, IMAGING, TESTING) - I reviewed patient records, labs, notes, testing and imaging myself where available.  Lab Results  Component Value Date   WBC 4.6 10/25/2014   HGB 13.3 10/25/2014   HCT 41.4 10/25/2014   MCV 87.3 10/25/2014   PLT 217 10/25/2014      Component Value Date/Time   NA 140 10/25/2014 1140   K 3.9 10/25/2014 1140   CL 105 10/25/2014 1140   CO2 28 10/25/2014 1140   GLUCOSE 91 10/25/2014 1140   BUN 8 10/25/2014 1140   CREATININE 0.75 10/25/2014 1140   CALCIUM 9.2 10/25/2014 1140   PROT 7.3 10/25/2014 1140   ALBUMIN 4.4 10/25/2014 1140   AST 19 10/25/2014 1140   ALT 17 10/25/2014 1140   ALKPHOS 95 10/25/2014 1140   BILITOT 0.6 10/25/2014 1140   GFRNONAA >60 10/25/2014 1140   GFRAA >60 10/25/2014 1140   Lab Results  Component Value Date   CHOL 186 11/19/2011   HDL 58.30 11/19/2011   LDLCALC 117* 11/19/2011   TRIG 54.0 11/19/2011   CHOLHDL 3 11/19/2011   No results found for: HGBA1C Lab Results  Component Value Date   VITAMINB12 293 11/19/2011   Lab Results  Component Value Date   TSH 1.44 11/19/2011      ASSESSMENT AND PLAN  Kieth Brightly  Kolasa is  a 44 y.o. female  With chronic headaches, extensive cervical surgery in the past detailed above,   1, headaches, with migraine features, will add on nortriptyline 10 mg, titrating to 20 mg as migraine prevention 2, Maxalt as needed 3, return to clinic with Hoyle Sauer in 2-3 months    Marcial Pacas, M.D. Ph.D.  [facility] [address] [place], [place] [address] Ph: [phone] Fax: [phone]

## 2014-11-18 ENCOUNTER — Other Ambulatory Visit: Payer: Self-pay | Admitting: Gastroenterology

## 2014-11-28 ENCOUNTER — Telehealth: Payer: Self-pay | Admitting: Family Medicine

## 2014-11-28 NOTE — Telephone Encounter (Signed)
Caller name: Venicia Relation to pt: self Call back number: [phone] Pharmacy:  Reason for call:   Patient states that she will be having surgery on 7/22 and is needing a surgical clearance appointment before then. When can I get her in?

## 2014-11-28 NOTE — Telephone Encounter (Signed)
Tuesday 12-06-14, at 11am.

## 2014-11-28 NOTE — Telephone Encounter (Signed)
Appointment scheduled.

## 2014-12-06 ENCOUNTER — Ambulatory Visit (INDEPENDENT_AMBULATORY_CARE_PROVIDER_SITE_OTHER): Payer: BLUE CROSS/BLUE SHIELD | Admitting: Family Medicine

## 2014-12-06 ENCOUNTER — Encounter: Payer: Self-pay | Admitting: Family Medicine

## 2014-12-06 VITALS — BP 124/78 | HR 77 | Temp 97.9°F | Resp 16 | Ht 64.0 in | Wt 177.5 lb

## 2014-12-06 DIAGNOSIS — Z01818 Encounter for other preprocedural examination: Secondary | ICD-10-CM

## 2014-12-06 LAB — BASIC METABOLIC PANEL
BUN: 12 mg/dL (ref 6–23)
CHLORIDE: 102 meq/L (ref 96–112)
CO2: 29 mEq/L (ref 19–32)
Calcium: 9.6 mg/dL (ref 8.4–10.5)
Creatinine, Ser: 0.8 mg/dL (ref 0.40–1.20)
GFR: 81.89 mL/min (ref >60.00)
Glucose, Bld: 86 mg/dL (ref 70–99)
POTASSIUM: 4.1 meq/L (ref 3.5–5.1)
Sodium: 138 mEq/L (ref 135–145)

## 2014-12-06 LAB — CBC WITH DIFFERENTIAL/PLATELET
BASOS PCT: 0.5 % (ref 0.0–3.0)
Basophils Absolute: 0 10*3/uL (ref 0.0–0.1)
EOS PCT: 1.3 % (ref 0.0–5.0)
Eosinophils Absolute: 0.1 10*3/uL (ref 0.0–0.7)
HEMATOCRIT: 39.5 % (ref 36.0–46.0)
HEMOGLOBIN: 13.1 g/dL (ref 12.0–15.0)
Lymphocytes Relative: 22.1 % (ref 12.0–46.0)
Lymphs Abs: 1.2 10*3/uL (ref 0.7–4.0)
MCHC: 33.2 g/dL (ref 30.0–36.0)
MCV: 85.2 fl (ref 78.0–100.0)
MONOS PCT: 10.8 % (ref 3.0–12.0)
Monocytes Absolute: 0.6 10*3/uL (ref 0.1–1.0)
NEUTROS PCT: 65.3 % (ref 43.0–77.0)
Neutro Abs: 3.5 10*3/uL (ref 1.4–7.7)
Platelets: 242 10*3/uL (ref 150.0–400.0)
RBC: 4.64 Mil/uL (ref 3.87–5.11)
RDW: 14.5 % (ref 11.5–15.5)
WBC: 5.4 10*3/uL (ref 4.0–10.5)

## 2014-12-06 LAB — HEPATIC FUNCTION PANEL
ALK PHOS: 81 U/L (ref 39–117)
ALT: 13 U/L (ref 0–35)
AST: 15 U/L (ref 0–37)
Albumin: 4.2 g/dL (ref 3.5–5.2)
Bilirubin, Direct: 0 mg/dL (ref 0.0–0.3)
TOTAL PROTEIN: 7.1 g/dL (ref 6.0–8.3)
Total Bilirubin: 0.3 mg/dL (ref 0.2–1.2)

## 2014-12-06 LAB — PROTIME-INR
INR: 1.1 ratio — ABNORMAL HIGH (ref 0.8–1.0)
Prothrombin Time: 12 s (ref 9.6–13.1)

## 2014-12-06 LAB — APTT: aPTT: 24.3 s (ref 23.4–32.7)

## 2014-12-06 NOTE — Progress Notes (Signed)
   Subjective:    Patient ID: Jamie Baker, female    DOB: [birth year], 44 y.o.   MRN: [id number]  HPI Surgical clearance- pt is scheduled for LP w/ blood patch on 7/29.  Pt's Neurosurgeon suspects Pseudotumor and wants her to have complete clearance prior to LP.  Wants CBC, UA, CMP, PT, PTT, INR.  Pt reports she is having 'really bad pressure HAs'.  'i pretty much stay in the bed all the time'.  No CP, SOB.  Will lose vision intermittently in L eye in setting of position changes.  No abd pain, N/V.   Review of Systems For ROS see HPI     Objective:   Physical Exam  Constitutional: She is oriented to person, place, and time. She appears well-developed and well-nourished. No distress.  HENT:  Head: Normocephalic and atraumatic.  Eyes: Conjunctivae and EOM are normal. Pupils are equal, round, and reactive to light.  Neck: Normal range of motion. Neck supple. No thyromegaly present.  Cardiovascular: Normal rate, regular rhythm, normal heart sounds and intact distal pulses.   No murmur heard. Pulmonary/Chest: Effort normal and breath sounds normal. No respiratory distress.  Abdominal: Soft. She exhibits no distension. There is no tenderness.  Musculoskeletal: She exhibits no edema.  Lymphadenopathy:    She has no cervical adenopathy.  Neurological: She is alert and oriented to person, place, and time.  Skin: Skin is warm and dry.  Psychiatric: She has a normal mood and affect. Her behavior is normal.  Vitals reviewed.         Assessment & Plan:

## 2014-12-06 NOTE — Patient Instructions (Signed)
Follow up as needed I'll fax the notes and the labs to your doctor in [place] Keep me updated on how things go Call with any questions or concerns HANG IN THERE!!!

## 2014-12-06 NOTE — Assessment & Plan Note (Signed)
There is no reason why patient can not have LP w/ blood patch.  Will get labs as requested by Neurosurgeon.  Will continue to follow.

## 2014-12-06 NOTE — Progress Notes (Signed)
Pre visit review using our clinic review tool, if applicable. No additional management support is needed unless otherwise documented below in the visit note. 

## 2014-12-07 ENCOUNTER — Telehealth: Payer: Self-pay | Admitting: Family Medicine

## 2014-12-07 NOTE — Telephone Encounter (Signed)
Jamie Baker from surg center states fax is broken will call back with new fax number.

## 2014-12-07 NOTE — Telephone Encounter (Signed)
Paperwork faxed °

## 2014-12-07 NOTE — Telephone Encounter (Signed)
Caller name: Kendrick Fries  Relation to pt: surg center in [place] Call back number: [phone]    Reason for call:  Surg center in [place] requesting physical and EKG please fax # [phone]

## 2014-12-10 ENCOUNTER — Encounter (HOSPITAL_BASED_OUTPATIENT_CLINIC_OR_DEPARTMENT_OTHER): Payer: Self-pay | Admitting: Emergency Medicine

## 2014-12-10 ENCOUNTER — Emergency Department (HOSPITAL_BASED_OUTPATIENT_CLINIC_OR_DEPARTMENT_OTHER): Payer: BLUE CROSS/BLUE SHIELD

## 2014-12-10 ENCOUNTER — Telehealth: Payer: BLUE CROSS/BLUE SHIELD | Admitting: Physician Assistant

## 2014-12-10 ENCOUNTER — Emergency Department (HOSPITAL_BASED_OUTPATIENT_CLINIC_OR_DEPARTMENT_OTHER)
Admission: EM | Admit: 2014-12-10 | Discharge: 2014-12-11 | Disposition: A | Discharge status: Home / Self Care | Payer: BLUE CROSS/BLUE SHIELD | Attending: Emergency Medicine | Admitting: Emergency Medicine

## 2014-12-10 DIAGNOSIS — J159 Unspecified bacterial pneumonia: Secondary | ICD-10-CM | POA: Diagnosis not present

## 2014-12-10 DIAGNOSIS — R509 Fever, unspecified: Secondary | ICD-10-CM | POA: Diagnosis present

## 2014-12-10 DIAGNOSIS — G43909 Migraine, unspecified, not intractable, without status migrainosus: Secondary | ICD-10-CM | POA: Insufficient documentation

## 2014-12-10 DIAGNOSIS — Z9889 Other specified postprocedural states: Secondary | ICD-10-CM | POA: Insufficient documentation

## 2014-12-10 DIAGNOSIS — G500 Trigeminal neuralgia: Secondary | ICD-10-CM | POA: Insufficient documentation

## 2014-12-10 DIAGNOSIS — G4733 Obstructive sleep apnea (adult) (pediatric): Secondary | ICD-10-CM | POA: Diagnosis not present

## 2014-12-10 DIAGNOSIS — Z79899 Other long term (current) drug therapy: Secondary | ICD-10-CM | POA: Insufficient documentation

## 2014-12-10 DIAGNOSIS — Z8619 Personal history of other infectious and parasitic diseases: Secondary | ICD-10-CM | POA: Insufficient documentation

## 2014-12-10 DIAGNOSIS — Q822 Mastocytosis: Secondary | ICD-10-CM | POA: Diagnosis not present

## 2014-12-10 DIAGNOSIS — Z9981 Dependence on supplemental oxygen: Secondary | ICD-10-CM | POA: Insufficient documentation

## 2014-12-10 DIAGNOSIS — R05 Cough: Secondary | ICD-10-CM

## 2014-12-10 DIAGNOSIS — M419 Scoliosis, unspecified: Secondary | ICD-10-CM | POA: Diagnosis not present

## 2014-12-10 DIAGNOSIS — Z87728 Personal history of other specified (corrected) congenital malformations of nervous system and sense organs: Secondary | ICD-10-CM | POA: Diagnosis not present

## 2014-12-10 DIAGNOSIS — Q796 Ehlers-Danlos syndrome: Secondary | ICD-10-CM | POA: Insufficient documentation

## 2014-12-10 DIAGNOSIS — Z8709 Personal history of other diseases of the respiratory system: Secondary | ICD-10-CM | POA: Diagnosis not present

## 2014-12-10 DIAGNOSIS — K219 Gastro-esophageal reflux disease without esophagitis: Secondary | ICD-10-CM | POA: Diagnosis not present

## 2014-12-10 DIAGNOSIS — R059 Cough, unspecified: Secondary | ICD-10-CM

## 2014-12-10 DIAGNOSIS — J189 Pneumonia, unspecified organism: Secondary | ICD-10-CM

## 2014-12-10 LAB — COMPREHENSIVE METABOLIC PANEL
ALBUMIN: 4.2 g/dL (ref 3.5–5.0)
ALT: 13 U/L — ABNORMAL LOW (ref 14–54)
AST: 19 U/L (ref 15–41)
Alkaline Phosphatase: 87 U/L (ref 38–126)
Anion gap: 7 (ref 5–15)
BILIRUBIN TOTAL: 0.4 mg/dL (ref 0.3–1.2)
BUN: 10 mg/dL (ref 6–20)
CALCIUM: 9.2 mg/dL (ref 8.9–10.3)
CHLORIDE: 103 mmol/L (ref 101–111)
CO2: 28 mmol/L (ref 22–32)
Creatinine, Ser: 0.94 mg/dL (ref 0.44–1.00)
Glucose, Bld: 93 mg/dL (ref 65–99)
POTASSIUM: 4 mmol/L (ref 3.5–5.1)
SODIUM: 138 mmol/L (ref 135–145)
TOTAL PROTEIN: 7.6 g/dL (ref 6.5–8.1)

## 2014-12-10 LAB — CBC WITH DIFFERENTIAL/PLATELET
Basophils Absolute: 0 10*3/uL (ref 0.0–0.1)
Basophils Relative: 0 % (ref 0–1)
Eosinophils Absolute: 0 10*3/uL (ref 0.0–0.7)
Eosinophils Relative: 1 % (ref 0–5)
HCT: 42.1 % (ref 36.0–46.0)
HEMOGLOBIN: 13.7 g/dL (ref 12.0–15.0)
LYMPHS ABS: 1 10*3/uL (ref 0.7–4.0)
Lymphocytes Relative: 15 % (ref 12–46)
MCH: 27.8 pg (ref 26.0–34.0)
MCHC: 32.5 g/dL (ref 30.0–36.0)
MCV: 85.4 fL (ref 78.0–100.0)
MONOS PCT: 14 % — AB (ref 3–12)
Monocytes Absolute: 0.9 10*3/uL (ref 0.1–1.0)
NEUTROS ABS: 4.9 10*3/uL (ref 1.7–7.7)
Neutrophils Relative %: 70 % (ref 43–77)
Platelets: 229 10*3/uL (ref 150–400)
RBC: 4.93 MIL/uL (ref 3.87–5.11)
RDW: 13.4 % (ref 11.5–15.5)
WBC: 6.9 10*3/uL (ref 4.0–10.5)

## 2014-12-10 LAB — URINALYSIS, ROUTINE W REFLEX MICROSCOPIC
Bilirubin Urine: NEGATIVE
Glucose, UA: NEGATIVE mg/dL
Hgb urine dipstick: NEGATIVE
KETONES UR: NEGATIVE mg/dL
LEUKOCYTES UA: NEGATIVE
Nitrite: NEGATIVE
PROTEIN: NEGATIVE mg/dL
Specific Gravity, Urine: 1.005 (ref 1.005–1.030)
Urobilinogen, UA: 0.2 mg/dL (ref 0.0–1.0)
pH: 6 (ref 5.0–8.0)

## 2014-12-10 LAB — I-STAT CG4 LACTIC ACID, ED: LACTIC ACID, VENOUS: 1.35 mmol/L (ref 0.5–2.0)

## 2014-12-10 MED ORDER — ACETAMINOPHEN 325 MG PO TABS
650.0000 mg | ORAL_TABLET | Freq: Once | ORAL | Status: AC
  Administered 2014-12-10: 650 mg via ORAL
  Filled 2014-12-10: qty 2

## 2014-12-10 MED ORDER — SODIUM CHLORIDE 0.9 % IV BOLUS (SEPSIS)
1000.0000 mL | Freq: Once | INTRAVENOUS | Status: AC
  Administered 2014-12-10: 1000 mL via INTRAVENOUS

## 2014-12-10 MED ORDER — ONDANSETRON HCL 4 MG/2ML IJ SOLN
4.0000 mg | Freq: Once | INTRAMUSCULAR | Status: AC
  Administered 2014-12-10: 4 mg via INTRAVENOUS
  Filled 2014-12-10: qty 2

## 2014-12-10 MED ORDER — AZITHROMYCIN 250 MG PO TABS
500.0000 mg | ORAL_TABLET | Freq: Once | ORAL | Status: AC
  Administered 2014-12-11: 500 mg via ORAL
  Filled 2014-12-10: qty 2

## 2014-12-10 MED ORDER — DEXTROSE 5 % IV SOLN
1.0000 g | Freq: Once | INTRAVENOUS | Status: AC
  Administered 2014-12-10: 1 g via INTRAVENOUS

## 2014-12-10 MED ORDER — BENZONATATE 100 MG PO CAPS: 100.0000 mg | ORAL_CAPSULE | Freq: Two times a day (BID) | ORAL | Status: DC | PRN

## 2014-12-10 MED ORDER — AZITHROMYCIN 250 MG PO TABS: ORAL_TABLET | ORAL | Status: DC

## 2014-12-10 MED ORDER — CEFTRIAXONE SODIUM 1 G IJ SOLR
INTRAMUSCULAR | Status: AC
  Filled 2014-12-10: qty 10

## 2014-12-10 NOTE — Progress Notes (Signed)
E visit for Flu like symptoms   We are sorry that you are not feeling well.  Here is how we plan to help! Based on what you have shared with me it looks like you may have a viral cough.  Does not seem consistent with a flu-like illness. Stay well hydrated and get plenty of rest. Use OTC Delsym for cough. Tylenol for fever. I have sent in a prescription for Tessalon for cough if the over-the-counter Delsym is not helping.  Influenza or "the flu" is   an infection caused by a respiratory virus. The flu virus is highly contagious and persons who did not receive their yearly flu vaccination may "catch" the flu from close contact.  We have anti-viral medications to treat the viruses that cause this infection. They are not a "cure" and only shorten the course of the infection. These prescriptions are most effective when they are given within the first 2 days of "flu" symptoms. Antiviral medication are indicated if you have a high risk of complications from the flu. You should  also consider an antiviral medication if you are in close contact with someone who is at risk. These medications can help patients avoid complications from the flu  but have side effects that you should know. Possible side effects from Tamiflu or oseltamivir include nausea, vomiting, diarrhea, dizziness, headaches, eye redness, sleep problems or other respiratory symptoms. You should not take Tamiflu if you have an allergy to oseltamivir or any to the ingredients in Tamiflu.    ANYONE WHO HAS FLU SYMPTOMS SHOULD: . Stay home. The flu is highly contagious and going out or to work exposes others! . Be sure to drink plenty of fluids. Water is fine as well as fruit juices, sodas and electrolyte beverages. You may want to stay away from caffeine or alcohol. If you are nauseated, try taking small sips of liquids. How do you know if you are getting enough fluid? Your urine should be a pale yellow or almost colorless. . Get rest. . Taking a  steamy shower or using a humidifier may help nasal congestion and ease sore throat pain. Using a saline nasal spray works much the same way. . Cough drops, hard candies and sore throat lozenges may ease your cough. . Line up a caregiver. Have someone check on you regularly.   GET HELP RIGHT AWAY IF: . You cannot keep down liquids or your medications. . You become short of breath . Your fell like you are going to pass out or loose consciousness. . Your symptoms persist after you have completed your treatment plan MAKE SURE YOU   Understand these instructions.  Will watch your condition.  Will get help right away if you are not doing well or get worse.  Your e-visit answers were reviewed by a board certified advanced clinical practitioner to complete your personal care plan.  Depending on the condition, your plan could have included both over the counter or prescription medications.  If there is a problem please reply  once you have received a response from your provider.  Your safety is important to [place].  If you have drug allergies check your prescription carefully.    You can use MyChart to ask questions about today's visit, request a non-urgent call back, or ask for a work or school excuse.  You will get an e-mail in the next two days asking about your experience.  I hope that your e-visit has been valuable and will speed your recovery.  Thank you for using e-visits.   

## 2014-12-10 NOTE — ED Notes (Addendum)
Pt states lethargy and nausea since yesterday and today pt has a fever of 102F at highest at home.  Pt scheduled to have surgery lumbar and a blood patch to rule out pseudotumorcerebry this coming Friday, July 29.  Pt advised to not taken any medications at all for 10 days prior to surgery.  Pt called nurse help line and received no call back after waiting 2 hours and is concerned bc she "never gets a fever" and states that her headache today is worse than it's normal level of pain.  Pt is able to keep down fluids and small amounts of food, stating decreased appetite.

## 2014-12-10 NOTE — ED Provider Notes (Signed)
CSN: [id number]     Arrival date & time 12/10/14  2021 History   First MD Initiated Contact with Patient 12/10/14 2046     Chief Complaint  Patient presents with  . Fever  . Headache     (Consider location/radiation/quality/duration/timing/severity/associated sxs/prior Treatment) Patient is a 44 y.o. female presenting with fever and headaches.  Fever Associated symptoms: headaches   Headache Associated symptoms: fever     Blood pressure 123/86, pulse 125, temperature 100.2 F (37.9 C), temperature source Oral, resp. rate 28, height 5\' 4"  (1.626 m), weight 174 lb (78.926 kg), SpO2 100 %.  [name] is a 44 y.o. female with past medical history significant for Chiari I malformation, Ehlers-Danlos syndrome complaining of worsening headache, nausea, generalized fatigue, fever and stiff neck onset one day ago. HA is global, 8/10 now, typically 6 out of 10, no pain medication taken prior to arrival. She reports that she's been running a fever today between 101.0 to 102.0 she is taking the temp this orally and axillary. Patient follows with neurosurgery in [place] who specializes in Chiari, she has a LP with blood patch scheduled this coming Friday to rule out pseudotumor cerebra. Patient has not had any recent instrumentation, she had a cervical fusion in February. States she's had a total of 6 spinal surgeries including multiple fusions and tethered cord release. Patient denies dysuria, urinary frequency, abnormal vaginal discharge, focal abdominal pain, chest pain, shortness of breath, cough, rhinorrhea, sore throat, otalgia, rash.  Past Medical History  Diagnosis Date  . Chiari I malformation   . Chicken pox   . Bronchitis   . Migraine   . Allergy   . GERD (gastroesophageal reflux disease)   . POTS (postural orthostatic tachycardia syndrome)   . C. difficile diarrhea   . Syncope   . Trigeminal neuralgia   . Ehlers-Danlos syndrome   . Mast cell disease   . Spina bifida occulta    . Other abnormality of brain or central nervous system function study     cranial cervical instability  . Sleep apnea with use of continuous positive airway pressure (CPAP)   . Scoliosis   . Raynaud's syndrome    Past Surgical History  Procedure Laterality Date  . Craniectomy suboccipital w/ cervical laminectomy / chiari      pt has metal in her neck  . Abdominal hysterectomy  2011  . Tonsillectomy and adenoidectomy  1975  . Tubal ligation  1989  . Achilles tendon lengthening  2009  . Achilles tendon repair  2010  . Chairi decompasion  2013  . Spinal cord surgery    . Acdf  2014    5,6,7   . Laminoplasty posterior cervical spine  06/2014    C2-C4   Family History  Problem Relation Age of Onset  . Arthritis Mother   . Breast cancer Mother   . Stroke Mother   . Hyperlipidemia Father   . Hypertension Father   . Diabetes Father   . Brain cancer Father   . Breast cancer Maternal Aunt   . Arthritis Maternal Grandmother   . Breast cancer Maternal Grandmother   . Stroke Maternal Grandfather   . Heart disease Maternal Grandfather   . Colon cancer Neg Hx   . Esophageal cancer Neg Hx   . Rectal cancer Neg Hx   . Stomach cancer Neg Hx    History  Substance Use Topics  . Smoking status: Never Smoker   . Smokeless tobacco: Never Used  .  Alcohol Use: 0.0 oz/week    0 Standard drinks or equivalent per week     Comment: Rarely   OB History    No data available     Review of Systems  Constitutional: Positive for fever.  Neurological: Positive for headaches.    10 systems reviewed and found to be negative, except as noted in the HPI.   Allergies  Levaquin; Gabapentin; and Zoloft  Home Medications   Prior to Admission medications   Medication Sig Start Date End Date Taking? Authorizing Provider  azithromycin (ZITHROMAX Z-PAK) 250 MG tablet 2 po day one, then 1 daily x 4 days 12/10/14   Elmyra Ricks Audery Wassenaar, PA-C  benzonatate (TESSALON) 100 MG capsule Take 1 capsule (100 mg  total) by mouth 2 (two) times daily as needed for cough. 12/10/14   Brunetta Jeans, PA-C  butalbital-acetaminophen-caffeine (FIORICET) [phone] MG per tablet Take by mouth 2 (two) times daily as needed for headache. 1-2 tablets every 4 hours as needed for headaches.    Historical Provider, MD  cetirizine (ZYRTEC) 10 MG tablet Take 10 mg by mouth daily.    Historical Provider, MD  cloNIDine (CATAPRES) 0.1 MG tablet Take 0.1 mg by mouth. Takes every 3 hours. 08/08/14   Historical Provider, MD  cromolyn (NASALCROM) 5.2 MG/ACT nasal spray Place 1 spray into the nose.    Historical Provider, MD  fluconazole (DIFLUCAN) 200 MG tablet Take 1 tablet (200 mg total) by mouth once. Repeat in 1 week if needed 12/11/14   [facility], PA-C  fludrocortisone (FLORINEF) 0.1 MG tablet Take 1 tablet (0.1 mg total) by mouth daily. 08/15/14   Asencion Partridge Dohmeier, MD  midodrine (PROAMATINE) 10 MG tablet TAKE 1 TABLET BY MOUTH THREE TIMES DAILY 09/01/14   Midge Minium, MD  ondansetron (ZOFRAN) 4 MG tablet Take 4 mg by mouth every 8 (eight) hours as needed for nausea or vomiting.  07/15/13   Historical Provider, MD  pantoprazole (PROTONIX) 40 MG tablet TAKE 1 TABLET BY MOUTH TWICE DAILY 10/18/14   Ladene Artist, MD  promethazine (PHENERGAN) 25 MG tablet Take 1 tablet (25 mg total) by mouth every 6 (six) hours as needed for nausea or vomiting. 07/21/13   Midge Minium, MD  propranolol (INDERAL) 20 MG tablet Take 20 mg by mouth daily. 06/28/13   Midge Minium, MD  ranitidine (ZANTAC) 150 MG tablet Take 150 mg by mouth daily.    Historical Provider, MD  rizatriptan (MAXALT-MLT) 10 MG disintegrating tablet Take 1 tablet (10 mg total) by mouth as needed for migraine. May repeat in 2 hours if needed 11/03/14   Marcial Pacas, MD   BP 103/65 mmHg  Pulse 88  Temp(Src) 98.8 F (37.1 C) (Oral)  Resp 15  Ht 5\' 4"  (1.626 m)  Wt 174 lb (78.926 kg)  BMI 29.85 kg/m2  SpO2 98%  LMP  Physical Exam  Constitutional: She is  oriented to person, place, and time. She appears well-developed and well-nourished. No distress.  HENT:  Head: Normocephalic.  Mouth/Throat: Oropharynx is clear and moist.  Eyes: Conjunctivae and EOM are normal. Pupils are equal, round, and reactive to light.  Neck: Normal range of motion. Neck supple.  Moving neck freely, patient can touch chin to chest she has positive midline C-spine tenderness to palpation. There are remote surgical scars.  Cardiovascular: Normal rate, regular rhythm and intact distal pulses.   Pulmonary/Chest: Effort normal and breath sounds normal. No stridor. No respiratory distress. She has no wheezes. She  has no rales. She exhibits no tenderness.  Abdominal: Soft. Bowel sounds are normal. She exhibits no distension and no mass. There is no tenderness. There is no rebound and no guarding.  Musculoskeletal: Normal range of motion.  Neurological: She is alert and oriented to person, place, and time.  Follows commands, Clear, goal oriented speech, Strength is 5 out of 5x4 extremities, patient ambulates with a coordinated in nonantalgic gait. Sensation is grossly intact.   Psychiatric: She has a normal mood and affect.  Nursing note and vitals reviewed.   ED Course  Procedures (including critical care time) Labs Review Labs Reviewed  CBC WITH DIFFERENTIAL/PLATELET - Abnormal; Notable for the following:    Monocytes Relative 14 (*)    All other components within normal limits  COMPREHENSIVE METABOLIC PANEL - Abnormal; Notable for the following:    ALT 13 (*)    All other components within normal limits  CULTURE, BLOOD (ROUTINE X 2)  CULTURE, BLOOD (ROUTINE X 2)  URINE CULTURE  URINALYSIS, ROUTINE W REFLEX MICROSCOPIC (NOT AT [facility])  I-STAT CG4 LACTIC ACID, ED    Imaging Review Dg Chest 2 View  12/10/2014   CLINICAL DATA:  Fever  EXAM: CHEST  2 VIEW  COMPARISON:  November 04, 2014  FINDINGS: There is focal patchy infiltrate in the superior segment left lower lobe.  The lungs elsewhere clear. Heart size and pulmonary vascularity are normal. No adenopathy. No bone lesions. There is postoperative change in the lower cervical region.  IMPRESSION: Superior segment left lower lobe patchy infiltrate.   Electronically Signed   By: Lowella Grip III M.D.   On: 12/10/2014 21:37   Ct Head Wo Contrast  12/10/2014   CLINICAL DATA:  44 year old female with chronic headaches. History of Chiari malformation.  EXAM: CT HEAD WITHOUT CONTRAST  TECHNIQUE: Contiguous axial images were obtained from the base of the skull through the vertex without intravenous contrast.  COMPARISON:  CT dated 02/07/2013 an MRI dated 01/20/2014  FINDINGS: There is postsurgical changes of suboccipital decompression craniectomy. There is crowding of the foramen magnum compatible with known Chiari malformation. The ventricles and the sulci are appropriate in size for the patient's age. There is no intracranial hemorrhage. No midline shift or mass effect identified. The gray-white matter differentiation is preserved.  The visualized paranasal sinuses and mastoid air cells are well aerated.  IMPRESSION: No acute intracranial pathology.  Chiari malformation with suboccipital decompression craniectomy.   Electronically Signed   By: Anner Crete M.D.   On: 12/10/2014 21:35     EKG Interpretation None      MDM   Final diagnoses:  Fever  CAP (community acquired pneumonia)   Filed Vitals:   12/10/14 2314 12/10/14 2315 12/10/14 2330 12/10/14 2345  BP:  110/69 107/64 103/65  Pulse:  86 82 88  Temp: 98.8 F (37.1 C)     TempSrc: Oral     Resp:  15    Height:      Weight:      SpO2:  100% 100% 98%    Medications  cefTRIAXone (ROCEPHIN) 1 G injection (not administered)  sodium chloride 0.9 % bolus 1,000 mL (0 mLs Intravenous Stopped 12/10/14 2258)  acetaminophen (TYLENOL) tablet 650 mg (650 mg Oral Given 12/10/14 2207)  ondansetron (ZOFRAN) injection 4 mg (4 mg Intravenous Given 12/10/14 2207)   cefTRIAXone (ROCEPHIN) 1 g in dextrose 5 % 50 mL IVPB (0 g Intravenous Stopped 12/10/14 2355)  azithromycin (ZITHROMAX) tablet 500 mg (500 mg Oral  Given 12/11/14 0001)    Jamie Baker is a pleasant 44 y.o. female presenting with fever, worsening headache and cervicalgia, patient has multiple urologic issues including Chiari malformation, spina bifida occulta she hasn't had any recent instrumentation. Neuro exam is nonfocal, patient does have midline C-spine tenderness palpation but she is moving the neck freely. She is initially tachycardic, tachypnea And borderline febrile orally at 100.2. Patient has no other symptoms of infection that would suggest another source however, will check x-ray and urinalysis, blood cultures are drawn.  Chest x-ray shows a left-sided infiltrate. Patient confirms that she has no cough, chest pain or chills, evaluated the x-ray and this does appear to be a defined infiltrate. Patient will be started on a community-acquired pneumonia regimen. We have discussed aggressive hydration and fever control. Patient is to follow with her PCP this week. No indication to perform lumbar puncture as we have a source for her fever. I've advised her to follow closely with her neurosurgeon. We've had an extensive discussion of return precautions patient verbalizes her understanding.  Discussed case with attending physician who agrees with care plan and disposition.   Evaluation does not show pathology that would require ongoing emergent intervention or inpatient treatment. Pt is hemodynamically stable and mentating appropriately. Discussed findings and plan with patient/guardian, who agrees with care plan. All questions answered. Return precautions discussed and outpatient follow up given.   Discharge Medication List as of 12/10/2014 11:40 PM    START taking these medications   Details  azithromycin (ZITHROMAX Z-PAK) 250 MG tablet 2 po day one, then 1 daily x 4 days, Print    benzonatate  (TESSALON) 100 MG capsule Take 1 capsule (100 mg total) by mouth 2 (two) times daily as needed for cough., Starting 12/10/2014, Until Discontinued, Normal             Monico Blitz, PA-C 12/11/14 4332  Noemi Chapel, MD 12/12/14 [phone]

## 2014-12-10 NOTE — Discharge Instructions (Signed)
For fever control you can take 650 mg of acetaminophen (Tylenol) this is normally 2 over the counter pills; every 4-6 hours, do not take any other medications that have acetaminophen as an ingredient. Do not drink alcohol.  Return to the emergency room for any worsening or concerning symptoms including fast breathing, heart racing, confusion, vomiting.  Rest, cover your mouth when you cough and wash your hands frequently.   Push fluids: water or Gatorade, do not drink any soda, juice or caffeinated beverages.  Do not return to work until a day after your fever breaks.   Please follow with your primary care doctor in the next 2 days for a check-up. They must obtain records for further management.   Do not hesitate to return to the Emergency Department for any new, worsening or concerning symptoms.

## 2014-12-11 MED ORDER — FLUCONAZOLE 200 MG PO TABS: 200.0000 mg | ORAL_TABLET | Freq: Once | ORAL | Status: DC

## 2014-12-12 LAB — URINE CULTURE

## 2014-12-16 LAB — CULTURE, BLOOD (ROUTINE X 2)
Culture: NO GROWTH
Culture: NO GROWTH

## 2014-12-27 ENCOUNTER — Encounter: Payer: Self-pay | Admitting: Family Medicine

## 2014-12-28 ENCOUNTER — Ambulatory Visit (HOSPITAL_BASED_OUTPATIENT_CLINIC_OR_DEPARTMENT_OTHER)
Admission: RE | Admit: 2014-12-28 | Discharge: 2014-12-28 | Disposition: A | Discharge status: Home / Self Care | Payer: BLUE CROSS/BLUE SHIELD | Source: Ambulatory Visit | Attending: Family Medicine | Admitting: Family Medicine

## 2014-12-28 ENCOUNTER — Ambulatory Visit (INDEPENDENT_AMBULATORY_CARE_PROVIDER_SITE_OTHER): Payer: BLUE CROSS/BLUE SHIELD | Admitting: Family Medicine

## 2014-12-28 ENCOUNTER — Encounter: Payer: Self-pay | Admitting: Family Medicine

## 2014-12-28 VITALS — BP 117/78 | HR 70 | Temp 97.9°F | Resp 16 | Ht 64.0 in | Wt 178.0 lb

## 2014-12-28 DIAGNOSIS — J189 Pneumonia, unspecified organism: Secondary | ICD-10-CM

## 2014-12-28 DIAGNOSIS — R059 Cough, unspecified: Secondary | ICD-10-CM

## 2014-12-28 DIAGNOSIS — R05 Cough: Secondary | ICD-10-CM

## 2014-12-28 DIAGNOSIS — Z8701 Personal history of pneumonia (recurrent): Secondary | ICD-10-CM

## 2014-12-28 DIAGNOSIS — R509 Fever, unspecified: Secondary | ICD-10-CM | POA: Diagnosis not present

## 2014-12-28 MED ORDER — CEFDINIR 300 MG PO CAPS: 300.0000 mg | ORAL_CAPSULE | Freq: Two times a day (BID) | ORAL | Status: DC

## 2014-12-28 MED ORDER — PREDNISONE 20 MG PO TABS: ORAL_TABLET | ORAL | Status: DC

## 2014-12-28 NOTE — Progress Notes (Signed)
Pre visit review using our clinic review tool, if applicable. No additional management support is needed unless otherwise documented below in the visit note. 

## 2014-12-28 NOTE — Progress Notes (Signed)
OFFICE VISIT  12/28/2014   CC:  Chief Complaint  Patient presents with  . Fever    started this morning, high of 99.69F   HPI:    Patient is a 44 y.o. Caucasian female who presents for low grade fever: Tm 99.7. Pt dx'd in ED on 12/10/14 with L lung infiltrate and was given 5d of zithromax. Says she now has a "cough that just won't quit".  Fatigue has improved since 2 wks ago and temp not as high. Initially (12/10/14) coughing was not occurring but now this is "nonstop".  EArs hurting now.  Tessalon did not help cough. Nyquil hs, mucinex DM recentlly.  Chest feels tight but no wheezing noted.  No SOB.  No CP  No URI sx's or ST. Has constant/chronic HA ("for 4 years straight").     Past Medical History  Diagnosis Date  . Chiari I malformation   . Chicken pox   . Bronchitis   . Migraine   . Allergy   . GERD (gastroesophageal reflux disease)   . POTS (postural orthostatic tachycardia syndrome)   . C. difficile diarrhea   . Syncope   . Trigeminal neuralgia   . Ehlers-Danlos syndrome   . Mast cell disease   . Spina bifida occulta   . Other abnormality of brain or central nervous system function study     cranial cervical instability  . Sleep apnea with use of continuous positive airway pressure (CPAP)   . Scoliosis   . Raynaud's syndrome     Past Surgical History  Procedure Laterality Date  . Craniectomy suboccipital w/ cervical laminectomy / chiari      pt has metal in her neck  . Abdominal hysterectomy  2011  . Tonsillectomy and adenoidectomy  1975  . Tubal ligation  1989  . Achilles tendon lengthening  2009  . Achilles tendon repair  2010  . Chairi decompasion  2013  . Spinal cord surgery    . Acdf  2014    5,6,7   . Laminoplasty posterior cervical spine  06/2014    C2-C4    Outpatient Prescriptions Prior to Visit  Medication Sig Dispense Refill  . butalbital-acetaminophen-caffeine (FIORICET) 50-325-40 MG per tablet Take by mouth 2 (two) times daily as needed for  headache. 1-2 tablets every 4 hours as needed for headaches.    . cetirizine (ZYRTEC) 10 MG tablet Take 10 mg by mouth daily.    . cloNIDine (CATAPRES) 0.1 MG tablet Take 0.1 mg by mouth. Takes every 3 hours.  4  . cromolyn (NASALCROM) 5.2 MG/ACT nasal spray Place 1 spray into the nose.    . fludrocortisone (FLORINEF) 0.1 MG tablet Take 1 tablet (0.1 mg total) by mouth daily. 60 tablet 5  . midodrine (PROAMATINE) 10 MG tablet TAKE 1 TABLET BY MOUTH THREE TIMES DAILY 90 tablet 3  . ondansetron (ZOFRAN) 4 MG tablet Take 4 mg by mouth every 8 (eight) hours as needed for nausea or vomiting.     . propranolol (INDERAL) 20 MG tablet Take 20 mg by mouth daily.    . ranitidine (ZANTAC) 150 MG tablet Take 150 mg by mouth daily.    . pantoprazole (PROTONIX) 40 MG tablet TAKE 1 TABLET BY MOUTH TWICE DAILY 60 tablet 0  . azithromycin (ZITHROMAX Z-PAK) 250 MG tablet 2 po day one, then 1 daily x 4 days (Patient not taking: Reported on 12/28/2014) 5 tablet 0  . benzonatate (TESSALON) 100 MG capsule Take 1 capsule (100 mg total)  by mouth 2 (two) times daily as needed for cough. (Patient not taking: Reported on 12/28/2014) 20 capsule 0  . fluconazole (DIFLUCAN) 200 MG tablet Take 1 tablet (200 mg total) by mouth once. Repeat in 1 week if needed (Patient not taking: Reported on 12/28/2014) 2 tablet 0  . promethazine (PHENERGAN) 25 MG tablet Take 1 tablet (25 mg total) by mouth every 6 (six) hours as needed for nausea or vomiting. (Patient not taking: Reported on 12/28/2014) 30 tablet 0  . rizatriptan (MAXALT-MLT) 10 MG disintegrating tablet Take 1 tablet (10 mg total) by mouth as needed for migraine. May repeat in 2 hours if needed (Patient not taking: Reported on 12/28/2014) 15 tablet 6   No facility-administered medications prior to visit.    Allergies  Allergen Reactions  . Levaquin [Levofloxacin In D5w]     Ruptured ache lis tendons    . Gabapentin Other (See Comments)    tremors  . Zoloft [Sertraline Hcl]  Other (See Comments)    hallucinations    ROS As per HPI  PE: Blood pressure 117/78, pulse 70, temperature 97.9 F (36.6 C), temperature source Oral, resp. rate 16, height 5\' 4"  (1.626 m), weight 178 lb (80.74 kg), SpO2 99 %. VS: noted--normal. Gen: alert, NAD, NONTOXIC APPEARING. HEENT: eyes without injection, drainage, or swelling.  Ears: EACs clear, TMs with normal light reflex and landmarks.  Nose: No rhinorrhea, nasal mucosa pink/moist and nonedematous.   No paranasal sinus TTP.  No facial swelling.  Throat and mouth without focal lesion.  No pharyngial swelling, erythema, or exudate.   Neck: supple, no LAD.   LUNGS: CTA bilat, nonlabored resps.  Forced exp maneuver elicited no wheeze or cough. CV: RRR, no m/r/g. EXT: no c/c/e SKIN: no rash  LABS:    Chemistry      Component Value Date/Time   NA 138 12/10/2014 2145   K 4.0 12/10/2014 2145   CL 103 12/10/2014 2145   CO2 28 12/10/2014 2145   BUN 10 12/10/2014 2145   CREATININE 0.94 12/10/2014 2145      Component Value Date/Time   CALCIUM 9.2 12/10/2014 2145   ALKPHOS 87 12/10/2014 2145   AST 19 12/10/2014 2145   ALT 13* 12/10/2014 2145   BILITOT 0.4 12/10/2014 2145       IMPRESSION AND PLAN:  Acute bronchitis vs CAP that is unresolved/re-emerging. Recheck CXR today. Start cefdinir 300 mg bid x 10d and prednisone 40mg  qd x 5d. Continue mucinex DM. Signs/symptoms to call or return for were reviewed and pt expressed understanding.  An After Visit Summary was printed and given to the patient.   FOLLOW UP: Return if symptoms worsen or fail to improve.

## 2014-12-30 ENCOUNTER — Encounter: Payer: Self-pay | Admitting: Neurology

## 2015-01-02 ENCOUNTER — Telehealth: Payer: Self-pay | Admitting: Neurology

## 2015-01-02 ENCOUNTER — Encounter: Payer: Self-pay | Admitting: General Practice

## 2015-01-02 ENCOUNTER — Telehealth: Payer: Self-pay | Admitting: Family Medicine

## 2015-01-02 MED ORDER — AMPHETAMINE-DEXTROAMPHET ER 20 MG PO CP24: 20.0000 mg | ORAL_CAPSULE | Freq: Every day | ORAL | Status: DC

## 2015-01-02 NOTE — Telephone Encounter (Signed)
Returned call, christine was unavailable message left to return call at her convenience.

## 2015-01-02 NOTE — Telephone Encounter (Signed)
   Hallo , Raidyn I do not see that Adderall would be contraindicated course of a Chiari malformation. Adderall can raise blood pressure, can create anxiety or panic attacks, insomnia, restless sleep. Based on this, I would be most happy to let Dr. Birdie Riddle prescribe Adderall again.  Try either a low immediate release form Add 10 mg IR ) for up to thre times a day , or go  for an medium size extended release form 20-30 mg ER, once daily.Emmit Alexanders , CD

## 2015-01-02 NOTE — Telephone Encounter (Signed)
Spoke with nurse and paperwork was received.

## 2015-01-02 NOTE — Addendum Note (Signed)
Addended by: Kris Hartmann on: 01/02/2015 11:26 AM   Modules accepted: Orders

## 2015-01-02 NOTE — Telephone Encounter (Signed)
Spoke to Shelby at Dr. Virgil Benedict office. She says that Dr. Birdie Riddle saw Dr. Edwena Felty recommendation via a mychart message to the pt.

## 2015-01-02 NOTE — Telephone Encounter (Signed)
error 

## 2015-01-02 NOTE — Telephone Encounter (Signed)
Med approved and pt aware at front desk for pick up.

## 2015-01-02 NOTE — Telephone Encounter (Signed)
Caller name: Altha Harm  Relation to pt: [facility]  Call back number: [phone]   Reason for call: Calling in regards to clearing Adderall medication

## 2015-01-02 NOTE — Telephone Encounter (Signed)
Jamie Baker/ Dr Birdie Riddle called regarding getting clearance to start adderall. Best time to reach Jamie Baker is right before lunch 11:00-11:30

## 2015-01-30 ENCOUNTER — Encounter: Payer: Self-pay | Admitting: Family Medicine

## 2015-01-30 ENCOUNTER — Ambulatory Visit (INDEPENDENT_AMBULATORY_CARE_PROVIDER_SITE_OTHER): Payer: BLUE CROSS/BLUE SHIELD | Admitting: Family Medicine

## 2015-01-30 VITALS — BP 120/82 | HR 102 | Temp 97.9°F | Resp 16 | Ht 64.0 in | Wt 179.1 lb

## 2015-01-30 DIAGNOSIS — J301 Allergic rhinitis due to pollen: Secondary | ICD-10-CM

## 2015-01-30 DIAGNOSIS — F988 Other specified behavioral and emotional disorders with onset usually occurring in childhood and adolescence: Secondary | ICD-10-CM

## 2015-01-30 DIAGNOSIS — F900 Attention-deficit hyperactivity disorder, predominantly inattentive type: Secondary | ICD-10-CM

## 2015-01-30 DIAGNOSIS — J309 Allergic rhinitis, unspecified: Secondary | ICD-10-CM | POA: Insufficient documentation

## 2015-01-30 MED ORDER — FLUTICASONE PROPIONATE 50 MCG/ACT NA SUSP: 2.0000 | Freq: Every day | NASAL | Status: DC

## 2015-01-30 MED ORDER — TYPHOID VACCINE PO CPDR: 1.0000 | DELAYED_RELEASE_CAPSULE | ORAL | Status: DC

## 2015-01-30 MED ORDER — AMPHETAMINE-DEXTROAMPHET ER 20 MG PO CP24: 20.0000 mg | ORAL_CAPSULE | Freq: Every day | ORAL | Status: DC

## 2015-01-30 NOTE — Assessment & Plan Note (Signed)
New to provider, hx of similar for pt.  She was cleared to start stimulant medication by neuro is setting of Chiari malformation and pt reports Adderall is helping w/ her attention span.  Denies side effects from medication.  Refill provided.  Will continue to follow.

## 2015-01-30 NOTE — Patient Instructions (Signed)
Schedule your complete physical in 6-12 months Continue the Adderall daily Continue the Zyrtec daily Add the Flonase to the Cromolyn Drink plenty of fluids REST! Start the Typhoid capsules ASAP b/c ideally they want this to be completed 1 week prior to travel and w/ every other day dosing, it will take 8 days to complete Call with any questions or concerns If you want to join [place] at the new [place] office, any scheduled appointments will automatically transfer and we will see you at [address] [place] [address] [place], [place], [place] [address]  Jamie Baker on Thursday!! Have a great trip!

## 2015-01-30 NOTE — Progress Notes (Signed)
   Subjective:    Patient ID: Jamie Baker, female    DOB: 1971/05/14, 44 y.o.   MRN: [id number]  HPI ADHD- predominately inattentive subtype.  Restarted on Adderall 1 month ago after getting clearance from Neuro.  Pt reports feeling more focused, 'now my brain wants to do stuff and my body says no'.  Denies palpitations.  Denies anorexia.  No insomnia after starting medication.  URI- sxs started Friday w/ nasal congestion, cough, ear fullness.  No sinus pain/pressure.  Low grade fever over the weekend- 99.6 Tm.  No N/V.  + sick contacts.   Review of Systems For ROS see HPI     Objective:   Physical Exam  Constitutional: She is oriented to person, place, and time. She appears well-developed and well-nourished. No distress.  HENT:  Head: Normocephalic and atraumatic.  Right Ear: Tympanic membrane normal.  Left Ear: Tympanic membrane normal.  Nose: Mucosal edema and rhinorrhea present. Right sinus exhibits no maxillary sinus tenderness and no frontal sinus tenderness. Left sinus exhibits no maxillary sinus tenderness and no frontal sinus tenderness.  Mouth/Throat: Mucous membranes are normal. Posterior oropharyngeal erythema (w/ PND) present.  Eyes: Conjunctivae and EOM are normal. Pupils are equal, round, and reactive to light.  Neck: Normal range of motion. Neck supple.  Cardiovascular: Normal rate, regular rhythm and normal heart sounds.   Pulmonary/Chest: Effort normal and breath sounds normal. No respiratory distress. She has no wheezes. She has no rales.  Lymphadenopathy:    She has no cervical adenopathy.  Neurological: She is alert and oriented to person, place, and time.  Skin: Skin is warm and dry.  Psychiatric: She has a normal mood and affect. Her behavior is normal. Thought content normal.  Vitals reviewed.         Assessment & Plan:

## 2015-01-30 NOTE — Assessment & Plan Note (Signed)
New.  Pt w/ likely a viral/allergy combo.  No evidence of bacterial infxn so no need for abx.  Start Flonase in addition to Cromolyn and continue Zyrtec daily.  Reviewed supportive care and red flags that should prompt return.  Pt expressed understanding and is in agreement w/ plan.

## 2015-02-14 ENCOUNTER — Encounter: Payer: Self-pay | Admitting: Family Medicine

## 2015-02-14 ENCOUNTER — Encounter: Payer: Self-pay | Admitting: General Practice

## 2015-03-02 ENCOUNTER — Other Ambulatory Visit: Payer: Self-pay | Admitting: Family Medicine

## 2015-03-02 MED ORDER — AMPHETAMINE-DEXTROAMPHET ER 20 MG PO CP24: 20.0000 mg | ORAL_CAPSULE | Freq: Every day | ORAL | Status: DC

## 2015-03-02 NOTE — Telephone Encounter (Signed)
Med filled and pt informed that it is available at the front desk for pick up.

## 2015-03-02 NOTE — Telephone Encounter (Signed)
Last OV 01/30/15 adderall last filled 01/30/15 #30 with 0

## 2015-03-03 ENCOUNTER — Encounter: Payer: Self-pay | Admitting: Family Medicine

## 2015-03-03 ENCOUNTER — Ambulatory Visit (INDEPENDENT_AMBULATORY_CARE_PROVIDER_SITE_OTHER): Payer: BLUE CROSS/BLUE SHIELD | Admitting: Family Medicine

## 2015-03-03 VITALS — BP 122/80 | HR 106 | Temp 98.0°F | Resp 16 | Wt 176.4 lb

## 2015-03-03 DIAGNOSIS — J209 Acute bronchitis, unspecified: Secondary | ICD-10-CM

## 2015-03-03 MED ORDER — PROMETHAZINE-DM 6.25-15 MG/5ML PO SYRP: 5.0000 mL | ORAL_SOLUTION | Freq: Four times a day (QID) | ORAL | Status: DC | PRN

## 2015-03-03 MED ORDER — FLUCONAZOLE 150 MG PO TABS: 150.0000 mg | ORAL_TABLET | Freq: Once | ORAL | Status: DC

## 2015-03-03 MED ORDER — AZITHROMYCIN 250 MG PO TABS: ORAL_TABLET | ORAL | Status: DC

## 2015-03-03 NOTE — Patient Instructions (Signed)
Follow up as needed Start the Zpack as directed Drink plenty of fluids REST! Use the cough syrup as needed- will cause drowsiness Mucinex DM for daytime cough Call with any questions or concerns Hang in there!

## 2015-03-03 NOTE — Addendum Note (Signed)
Addended by: Midge Minium on: 03/03/2015 02:05 PM   Modules accepted: Orders

## 2015-03-03 NOTE — Assessment & Plan Note (Signed)
New.  Pt's sxs are most likely viral but given her recent travel to [place], will cover w/ Zpack for any atypical infections.  Start cough meds.  Reviewed supportive care and red flags that should prompt return.  Pt expressed understanding and is in agreement w/ plan.

## 2015-03-03 NOTE — Progress Notes (Signed)
   Subjective:    Patient ID: Jamie Baker, female    DOB: 06-Sep-1970, 44 y.o.   MRN: [id number]  HPI URI- sxs started ~5 days ago.  + low grade temps.  + hacking cough- productive of green sputum.  + PND.  + sinus pain/pressure.  Mild ear pressure.  + nausea, no vomiting.  Pt recently returned from traveling in [place] and has been on multiple planes.   Review of Systems For ROS see HPI     Objective:   Physical Exam  Constitutional: She is oriented to person, place, and time. She appears well-developed and well-nourished. No distress.  HENT:  Head: Normocephalic and atraumatic.  + frontal sinus TTP, no TTP over maxillary sinuses Copious PND TMs WNL bilaterally  Neck: Normal range of motion. Neck supple.  Pulmonary/Chest: Effort normal and breath sounds normal. No respiratory distress. She has no wheezes. She has no rales.  Lymphadenopathy:    She has no cervical adenopathy.  Neurological: She is alert and oriented to person, place, and time.  Skin: Skin is warm.  Vitals reviewed.         Assessment & Plan:

## 2015-03-15 ENCOUNTER — Ambulatory Visit: Payer: BLUE CROSS/BLUE SHIELD | Admitting: Nurse Practitioner

## 2015-03-20 ENCOUNTER — Encounter: Payer: Self-pay | Admitting: Nurse Practitioner

## 2015-03-20 ENCOUNTER — Ambulatory Visit (INDEPENDENT_AMBULATORY_CARE_PROVIDER_SITE_OTHER): Payer: BLUE CROSS/BLUE SHIELD | Admitting: Nurse Practitioner

## 2015-03-20 ENCOUNTER — Encounter: Payer: Self-pay | Admitting: Family Medicine

## 2015-03-20 VITALS — BP 109/76 | HR 90 | Ht 64.0 in | Wt 173.8 lb

## 2015-03-20 DIAGNOSIS — R000 Tachycardia, unspecified: Secondary | ICD-10-CM | POA: Diagnosis not present

## 2015-03-20 DIAGNOSIS — R51 Headache: Secondary | ICD-10-CM | POA: Diagnosis not present

## 2015-03-20 DIAGNOSIS — I498 Other specified cardiac arrhythmias: Secondary | ICD-10-CM

## 2015-03-20 DIAGNOSIS — G935 Compression of brain: Secondary | ICD-10-CM

## 2015-03-20 DIAGNOSIS — G90A Postural orthostatic tachycardia syndrome (POTS): Secondary | ICD-10-CM

## 2015-03-20 DIAGNOSIS — G4733 Obstructive sleep apnea (adult) (pediatric): Secondary | ICD-10-CM

## 2015-03-20 DIAGNOSIS — G8929 Other chronic pain: Secondary | ICD-10-CM

## 2015-03-20 DIAGNOSIS — R519 Headache, unspecified: Secondary | ICD-10-CM | POA: Insufficient documentation

## 2015-03-20 DIAGNOSIS — I951 Orthostatic hypotension: Secondary | ICD-10-CM | POA: Diagnosis not present

## 2015-03-20 DIAGNOSIS — Z9989 Dependence on other enabling machines and devices: Secondary | ICD-10-CM

## 2015-03-20 NOTE — Progress Notes (Signed)
[facility]  PATIENT: Jamie Baker DOB: December 16, 1970   REASON FOR VISIT: migraine without aura, chiari malformation HISTORY FROM: Patient    HISTORY OF PRESENT ILLNESS: Jamie Baker, 44 year old female returns for follow-up. She was last seen in the office 11/03/2014. She has a history of headaches and multiple surgeries in the past to improve her symptoms. She recently found out she has a blockage of both jugular veins and is due to have stents placed in January 2017 by Dr. Oleta Mouse in [place]. She did not add on  Nortriptyline at her last visit with Dr. Krista Blue. Medications have not been very beneficial  for her in the past. She does not want to try any additional preventatives. She returns for reevaluation   HISTORY Jamie Baker is a 44 yo RH female, seen in prefer by her primary care physician Dr. Birdie Riddle for evaluation of severe headaches, she is accompanied by her mother at today's clinical visit.  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 Gloriann Loan, which fail to improve her symptoms, she continue complains of severe daily headaches She had seek specialist at [place], had ACDF C5-6-7 by neurosurgeon at [facility] Dr. Haydee Monica in June 2014, for symptoms of frequent headaches, neck pain, bilateral upper and lower extremity paresthesia, again surgery failed to help her symptoms. February 2016, she had a posterior cervical 2, 3 4, laminoplasty for cranial cervical instability by Dr. Haydee Monica She also had a tethered spinal cord release surgery in the past, was under the care off in [facility] neurologists, received multiple cervical, lumbar epidural injection for her complains of chronic neck, low back pain, chronic headaches, without control her symptoms. She had a  history of POTS, is taking Midodrine, also clonidine, and Inderal Post surgically, she continued to complains significant daily headaches, today she presented with 3 weeks history of 10 out of 10 holocranial pressure headaches, is associated light noise sensitivity, nauseous, which has put her bed most of the days.  Over the years, she tried different medications, Topamax did not help her headaches,  She has tried multiple abortive treatment over the past few weeks, Fioricet, Dilaudid, oxycodone, hydrocodone Norco, failed to improve her headaches,  We have reviewed MRI of brain in September 2015,Prior suboccipital craniectomy for Chiari I malformation without residual tonsillar herniation. There is a subcentimeter area of differential enhancement in the RIGHT division of the pituitary which could represent a small microadenoma    REVIEW OF SYSTEMS: Full 14 system review of systems performed and notable only for those listed, all others are neg:  Constitutional: neg  Cardiovascular: neg Ear/Nose/Throat: Ringing in the ears, hearing loss Skin: neg Eyes: neg Respiratory: neg Gastroitestinal: neg  Hematology/Lymphatic: neg  Endocrine: neg Musculoskeletal: Joint pain, neck pain Allergy/Immunology: neg Neurological: Dizziness headache, passing out spells Psychiatric: neg Sleep : Obstructive sleep apnea with CPAP   ALLERGIES: Allergies  Allergen Reactions  . Levaquin [Levofloxacin In D5w]     Ruptured ache lis tendons    . Gabapentin Other (See Comments)    tremors  . Zoloft [Sertraline Hcl] Other (See Comments)    hallucinations  . Latex Rash    HOME MEDICATIONS: Outpatient Prescriptions Prior to Visit  Medication Sig Dispense Refill  . amphetamine-dextroamphetamine (ADDERALL XR) 20 MG 24 hr capsule Take 1 capsule (20 mg total) by mouth daily. 30 capsule 0  .  butalbital-acetaminophen-caffeine (FIORICET) 50-325-40 MG per tablet Take by mouth 2 (two) times daily as needed for  headache. 1-2 tablets every 4 hours as needed for headaches.    . cetirizine (ZYRTEC) 10 MG tablet Take 10 mg by mouth daily.    . cloNIDine (CATAPRES) 0.1 MG tablet Take 0.1 mg by mouth. Takes every 3 hours.  4  . cromolyn (NASALCROM) 5.2 MG/ACT nasal spray Place 1 spray into the nose.    . fludrocortisone (FLORINEF) 0.1 MG tablet Take 1 tablet (0.1 mg total) by mouth daily. 60 tablet 5  . fluticasone (FLONASE) 50 MCG/ACT nasal spray Place 2 sprays into both nostrils daily. 16 g 6  . midodrine (PROAMATINE) 10 MG tablet TAKE 1 TABLET BY MOUTH THREE TIMES DAILY 90 tablet 3  . ondansetron (ZOFRAN) 4 MG tablet Take 4 mg by mouth every 8 (eight) hours as needed for nausea or vomiting.     . promethazine-dextromethorphan (PROMETHAZINE-DM) 6.25-15 MG/5ML syrup Take 5 mLs by mouth 4 (four) times daily as needed. 240 mL 0  . propranolol (INDERAL) 20 MG tablet Take 20 mg by mouth daily.    . ranitidine (ZANTAC) 150 MG tablet Take 150 mg by mouth daily.    Marland Kitchen azithromycin (ZITHROMAX) 250 MG tablet 2 tabs on day 1, 1 tab on day 2-5 (Patient not taking: Reported on 03/20/2015) 6 tablet 0  . fluconazole (DIFLUCAN) 150 MG tablet Take 1 tablet (150 mg total) by mouth once. (Patient not taking: Reported on 03/20/2015) 1 tablet 0   No facility-administered medications prior to visit.    PAST MEDICAL HISTORY: Past Medical History  Diagnosis Date  . Chiari I malformation ([place])   . Chicken pox   . Bronchitis   . Migraine   . Allergy   . GERD (gastroesophageal reflux disease)   . POTS (postural orthostatic tachycardia syndrome)   . C. difficile diarrhea   . Syncope   . Trigeminal neuralgia   . Ehlers-Danlos syndrome   . Mast cell disease   . Spina bifida occulta   . Other abnormality of brain or central nervous system function study     cranial cervical instability  . Sleep apnea with use of continuous positive airway pressure (CPAP)   . Scoliosis   . Raynaud's syndrome   . Intracranial hypertension      PAST SURGICAL HISTORY: Past Surgical History  Procedure Laterality Date  . Craniectomy suboccipital w/ cervical laminectomy / chiari      pt has metal in her neck  . Abdominal hysterectomy  2011  . Tonsillectomy and adenoidectomy  1975  . Tubal ligation  1989  . Achilles tendon lengthening  2009  . Achilles tendon repair  2010  . Chairi decompasion  2013  . Spinal cord surgery    . Acdf  2014    5,6,7   . Laminoplasty posterior cervical spine  06/2014    C2-C4    FAMILY HISTORY: Family History  Problem Relation Age of Onset  . Arthritis Mother   . Breast cancer Mother   . Stroke Mother   . Hyperlipidemia Father   . Hypertension Father   . Diabetes Father   . Brain cancer Father   . Breast cancer Maternal Aunt   . Arthritis Maternal Grandmother   . Breast cancer Maternal Grandmother   . Stroke Maternal Grandfather   . Heart disease Maternal Grandfather   . Colon cancer Neg Hx   . Esophageal cancer Neg Hx   . Rectal cancer  Neg Hx   . Stomach cancer Neg Hx     SOCIAL HISTORY: Social History   Social History  . Marital Status: Married    Spouse Name: N/A  . Number of Children: 3  . Years of Education: MBA   Occupational History  . Accountant    Social History Main Topics  . Smoking status: Never Smoker   . Smokeless tobacco: Never Used  . Alcohol Use: 0.0 oz/week    0 Standard drinks or equivalent per week     Comment: Rarely  . Drug Use: No  . Sexual Activity: Not on file   Other Topics Concern  . Not on file   Social History Narrative   Right handed.  Work- Public house manager, Married, 3 kids.     Caffeine 1 glass daily.     PHYSICAL EXAM  Filed Vitals:   03/20/15 0911  BP: 109/76  Pulse: 90  Height: 5\' 4"  (1.626 m)  Weight: 173 lb 12.8 oz (78.835 kg)   Body mass index is 29.82 kg/(m^2).  Generalized: Well developed, obese female in no acute distress  Head: normocephalic and atraumatic,. Oropharynx benign  Neck: Supple, no carotid bruits   Cardiac: Regular rate rhythm, no murmur  Musculoskeletal: No deformity   Neurological examination   Mentation: Alert oriented to time, place, history taking. Attention span and concentration appropriate. Recent and remote memory intact.  Follows all commands speech and language fluent.   Cranial nerve II-XII: Pupils were equal round reactive to light extraocular movements were full, visual field were full on confrontational test. Facial sensation and strength were normal. hearing was intact to finger rubbing bilaterally. Uvula tongue midline. head turning and shoulder shrug were normal and symmetric.Tongue protrusion into cheek strength was normal. Motor: normal bulk and tone, full strength in the BUE, BLE, fine finger movements normal, no pronator drift. No focal weakness Sensory: normal and symmetric to light touch, pinprick, and  Vibration,   Coordination: finger-nose-finger, heel-to-shin bilaterally, no dysmetria Reflexes: Brachioradialis 2/2, biceps 2/2, triceps 2/2, patellar 2/2, Achilles 2/2, plantar responses were flexor bilaterally. Gait and Station: Rising up from seated position without assistance, normal stance,  moderate stride, good arm swing, smooth turning, able to perform tiptoe, and heel walking without difficulty. Tandem gait is steady  DIAGNOSTIC DATA (LABS, IMAGING, TESTING) - I reviewed patient records, labs, notes, testing and imaging myself where available.  Lab Results  Component Value Date   WBC 6.9 12/10/2014   HGB 13.7 12/10/2014   HCT 42.1 12/10/2014   MCV 85.4 12/10/2014   PLT 229 12/10/2014      Component Value Date/Time   NA 138 12/10/2014 2145   K 4.0 12/10/2014 2145   CL 103 12/10/2014 2145   CO2 28 12/10/2014 2145   GLUCOSE 93 12/10/2014 2145   BUN 10 12/10/2014 2145   CREATININE 0.94 12/10/2014 2145   CALCIUM 9.2 12/10/2014 2145   PROT 7.6 12/10/2014 2145   ALBUMIN 4.2 12/10/2014 2145   AST 19 12/10/2014 2145   ALT 13* 12/10/2014 2145    ALKPHOS 87 12/10/2014 2145   BILITOT 0.4 12/10/2014 2145   GFRNONAA >60 12/10/2014 2145   GFRAA >60 12/10/2014 2145      ASSESSMENT AND PLAN  44 y.o. year old female  has a past medical history of Chiari I malformation ([place]); Migraine; Trigeminal neuralgia;  Spina bifida occulta; Other abnormality of brain or central nervous system function study; Sleep apnea with use of continuous positive airway pressure (CPAP);  here  to follow-up.She recently found out she has a blockage of both jugular veins and is due to have stents placed in January 2017 by Dr. Oleta Mouse in [place].  F/U with Dr. Carolynn Comment in February for sleep  Continue same meds Dennie Bible, [facility], [facility], APRN  [facility] [address] [place], [place] [address] [phone]

## 2015-03-20 NOTE — Progress Notes (Signed)
I have reviewed and agreed above plan. 

## 2015-03-20 NOTE — Patient Instructions (Signed)
F/U with Dr. Carolynn Comment in February Continue same meds

## 2015-04-18 ENCOUNTER — Encounter: Payer: Self-pay | Admitting: Family Medicine

## 2015-04-18 MED ORDER — AMPHETAMINE-DEXTROAMPHET ER 20 MG PO CP24: 20.0000 mg | ORAL_CAPSULE | Freq: Every day | ORAL | Status: DC

## 2015-04-18 NOTE — Telephone Encounter (Signed)
Last OV 03/03/15 adderall last filled 03/02/15 #30 with 0

## 2015-04-18 NOTE — Telephone Encounter (Signed)
Medication filled to pharmacy as requested.   

## 2015-04-26 ENCOUNTER — Ambulatory Visit (INDEPENDENT_AMBULATORY_CARE_PROVIDER_SITE_OTHER): Payer: BLUE CROSS/BLUE SHIELD | Admitting: Family Medicine

## 2015-04-26 ENCOUNTER — Encounter: Payer: Self-pay | Admitting: Family Medicine

## 2015-04-26 VITALS — BP 114/82 | HR 105 | Temp 97.9°F | Resp 17 | Ht 64.0 in | Wt 170.4 lb

## 2015-04-26 DIAGNOSIS — J0100 Acute maxillary sinusitis, unspecified: Secondary | ICD-10-CM

## 2015-04-26 MED ORDER — AMOXICILLIN 875 MG PO TABS: 875.0000 mg | ORAL_TABLET | Freq: Two times a day (BID) | ORAL | Status: DC

## 2015-04-26 MED ORDER — FLUCONAZOLE 150 MG PO TABS: 150.0000 mg | ORAL_TABLET | Freq: Once | ORAL | Status: DC

## 2015-04-26 NOTE — Progress Notes (Signed)
Pre visit review using our clinic review tool, if applicable. No additional management support is needed unless otherwise documented below in the visit note. 

## 2015-04-26 NOTE — Assessment & Plan Note (Signed)
Pt's sxs and PE consistent w/ infxn.  Start abx.  Reviewed supportive care and red flags that should prompt return.  Pt expressed understanding and is in agreement w/ plan.  

## 2015-04-26 NOTE — Progress Notes (Signed)
   Subjective:    Patient ID: Jamie Baker, female    DOB: Oct 05, 1970, 44 y.o.   MRN: [id number]  HPI URI- L ear pain, PND.  sxs started 4 days ago.  Pt reports sporadic, subjective low grade fevers that improve w/ tylenol.  L facial pressure.  + HA behind L eye last night.  Intermittent cough.  No known sick contacts.   Review of Systems For ROS see HPI     Objective:   Physical Exam  Constitutional: She appears well-developed and well-nourished. No distress.  HENT:  Head: Normocephalic and atraumatic.  Right Ear: Tympanic membrane normal.  Left Ear: Tympanic membrane is retracted.  Nose: Mucosal edema and rhinorrhea present. Right sinus exhibits no maxillary sinus tenderness and no frontal sinus tenderness. Left sinus exhibits maxillary sinus tenderness and frontal sinus tenderness.  Mouth/Throat: Uvula is midline and mucous membranes are normal. Posterior oropharyngeal erythema present. No oropharyngeal exudate.  Eyes: Conjunctivae and EOM are normal. Pupils are equal, round, and reactive to light.  Neck: Normal range of motion. Neck supple.  Cardiovascular: Normal rate, regular rhythm and normal heart sounds.   Pulmonary/Chest: Effort normal and breath sounds normal. No respiratory distress. She has no wheezes.  Lymphadenopathy:    She has no cervical adenopathy.  Vitals reviewed.         Assessment & Plan:

## 2015-04-26 NOTE — Patient Instructions (Signed)
Follow up as needed Start the Amoxicillin twice daily- w/ food Drink plenty of fluids REST! Call with any questions or concerns If you want to join [place] at the new [place] office, any scheduled appointments will automatically transfer and we will see you at [address] [place] [address] [place], [place] [address] ([place] 05/23/15) [place] in there!!! Happy Holidays!!!

## 2015-05-21 HISTORY — PX: OTHER SURGICAL HISTORY: SHX169

## 2015-05-23 ENCOUNTER — Encounter: Payer: Self-pay | Admitting: Family Medicine

## 2015-05-23 MED ORDER — AMPHETAMINE-DEXTROAMPHET ER 20 MG PO CP24: 20.0000 mg | ORAL_CAPSULE | Freq: Every day | ORAL | Status: DC

## 2015-05-23 NOTE — Telephone Encounter (Signed)
Last OV 05/06/15 adderall last filled 04/18/15 #30 with 0

## 2015-05-23 NOTE — Telephone Encounter (Signed)
Med filled, pt advised available at front desk for pick up.

## 2015-06-17 IMAGING — CR DG CHEST 2V
2 series · 2 of 2 positions shown · non-contrast
Comparison: 08/31/2013

CLINICAL DATA: Headache for months, chest pain for a few days,
history GERD, Ehlers-Danlos syndrome

EXAM:
CHEST  2 VIEW

[w chest pa]
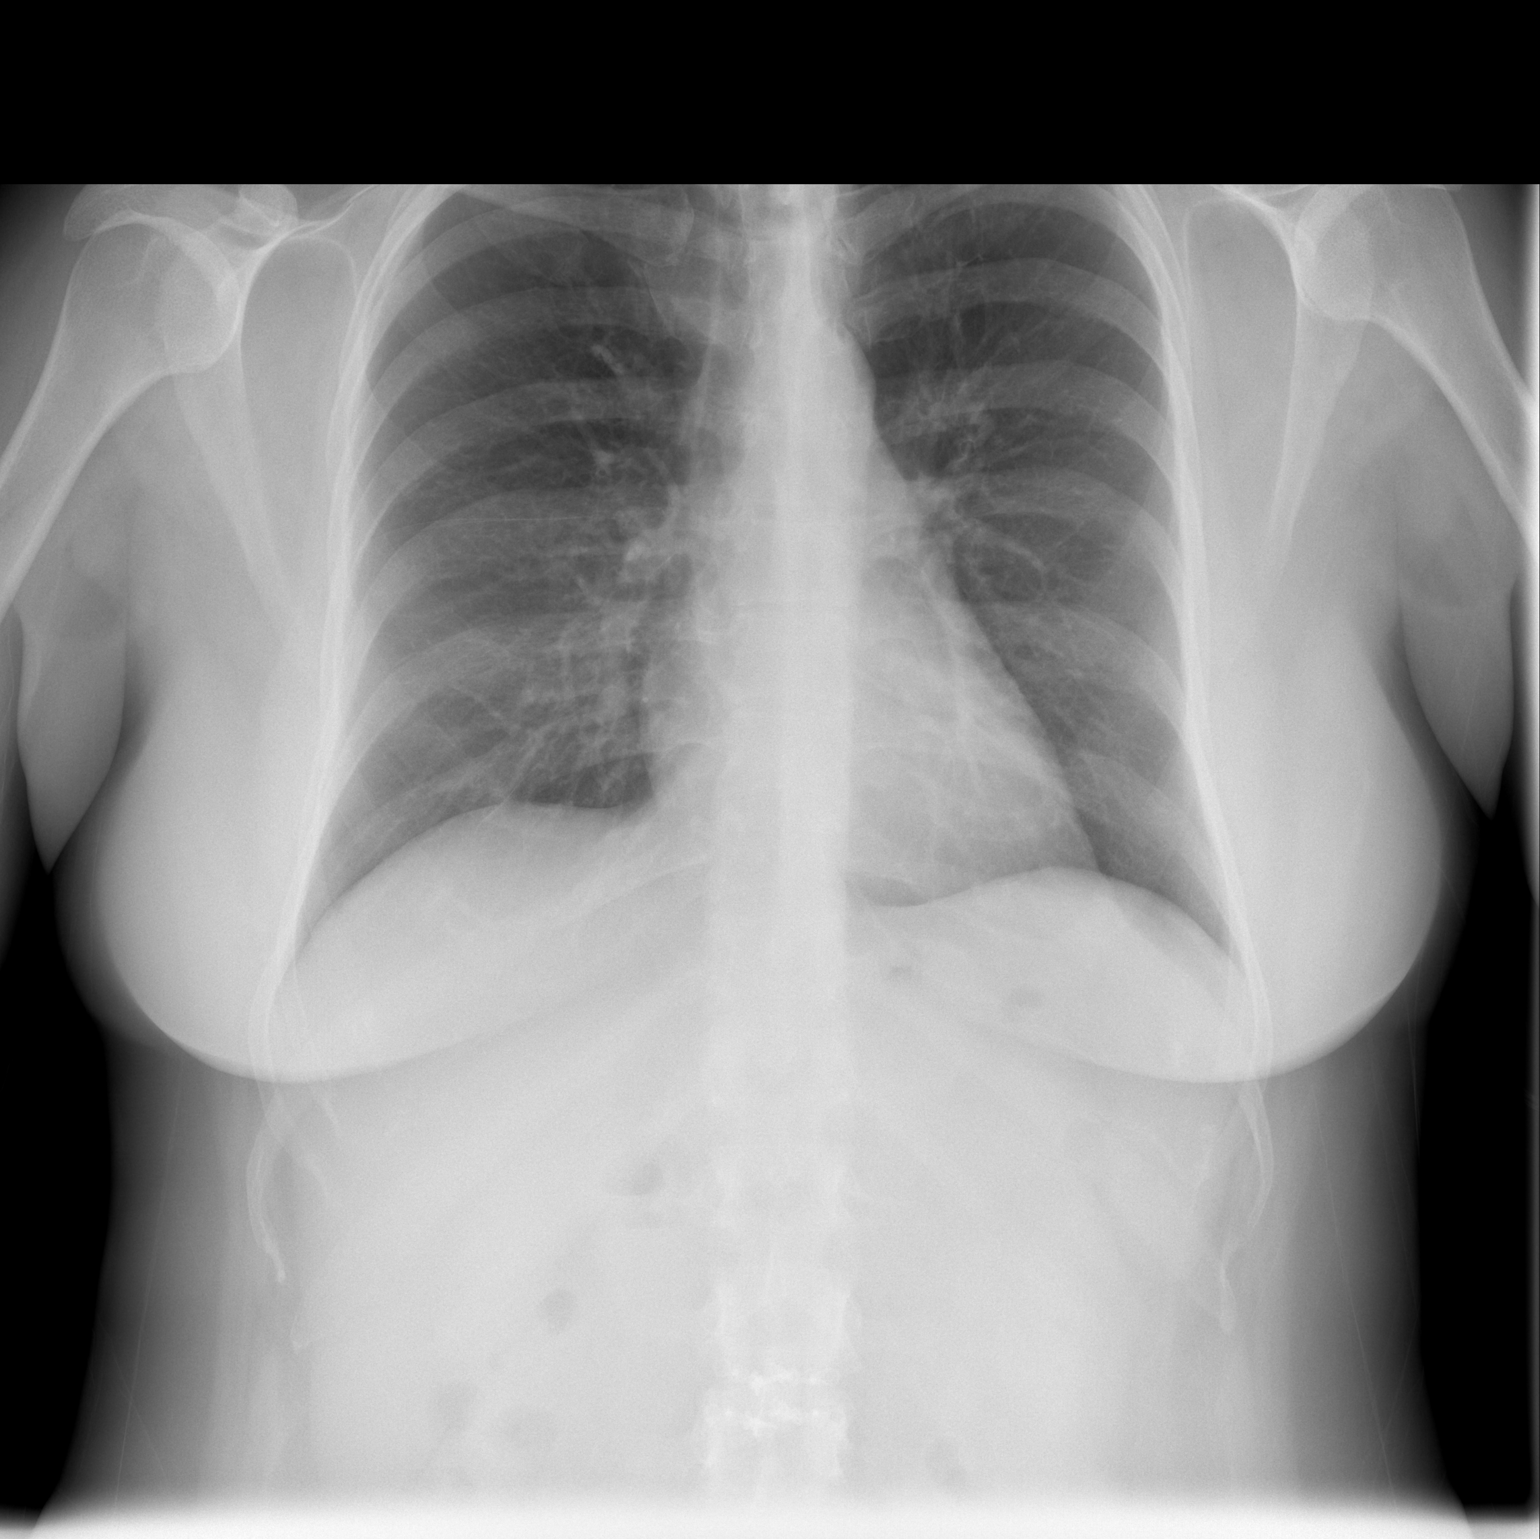

[w chest lat]
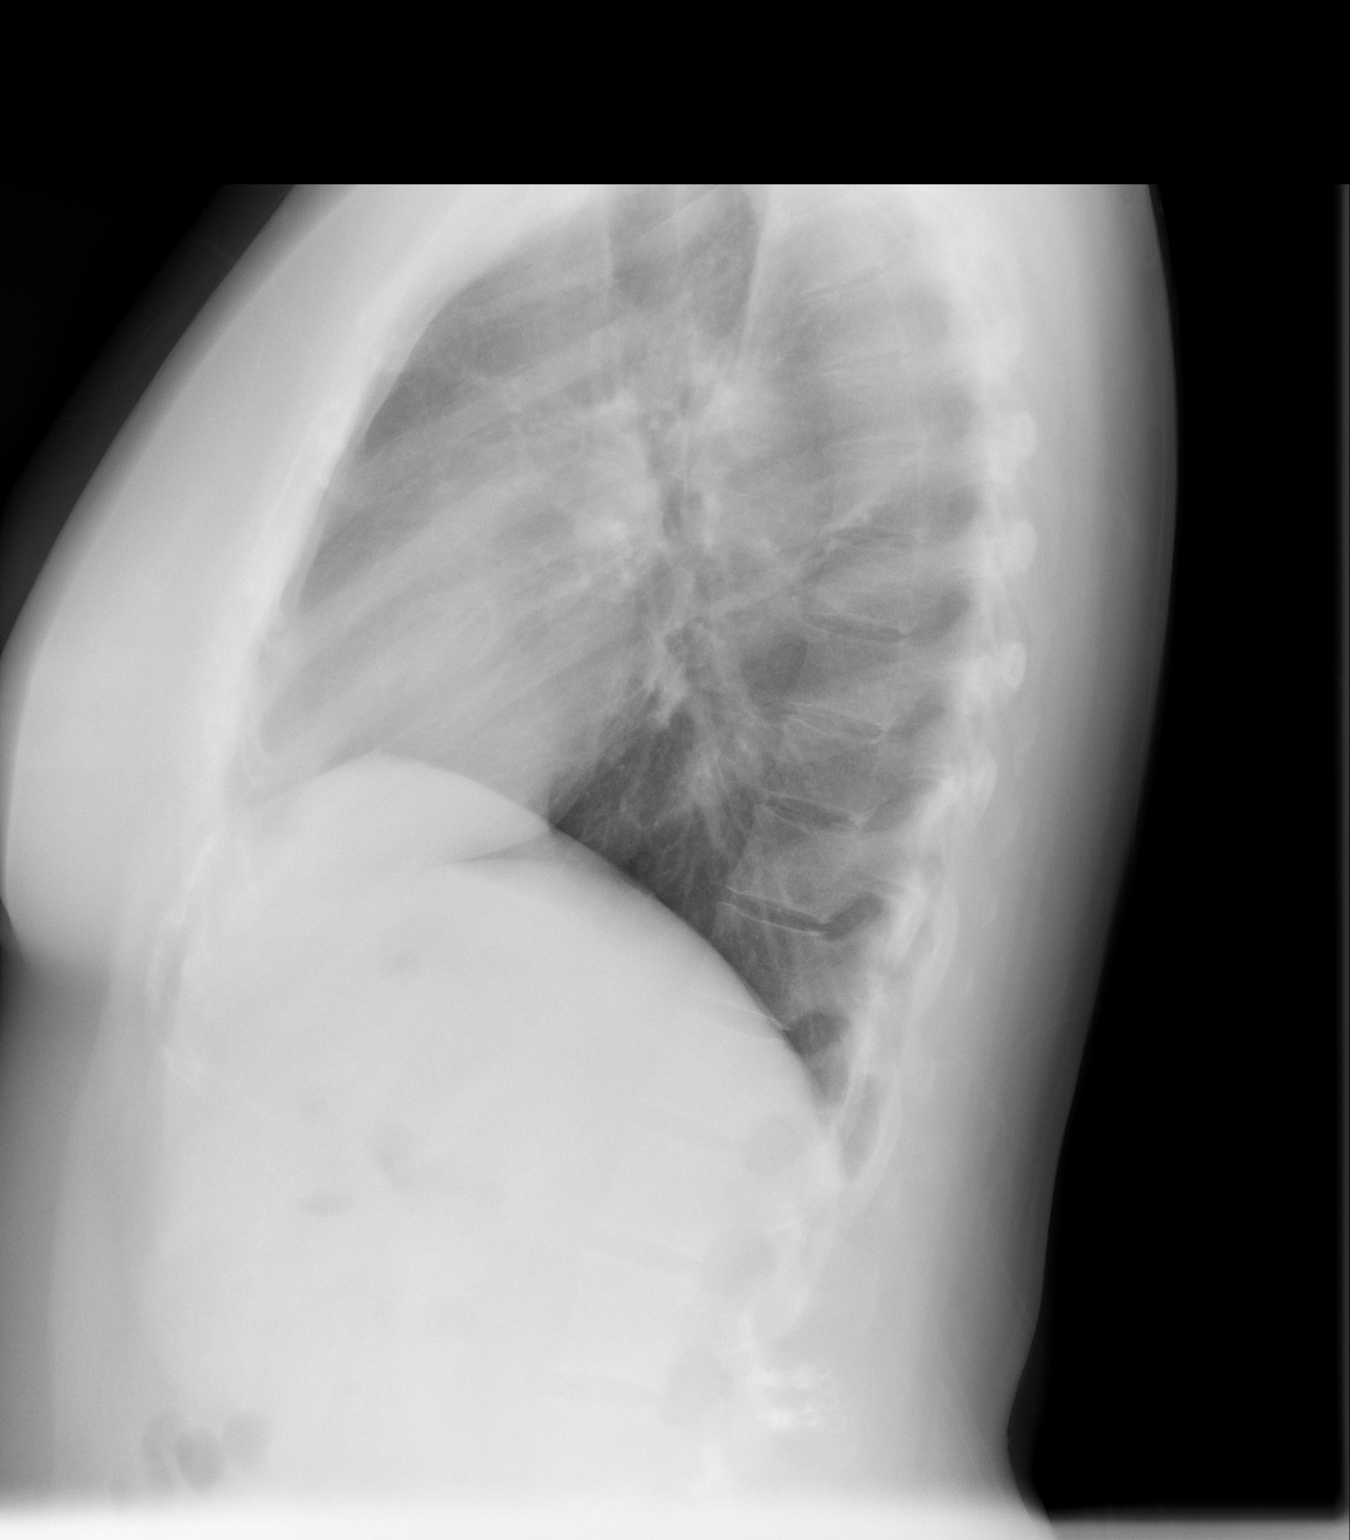

[2 of 2 positions shown; findings below may reference images not displayed]

FINDINGS: Normal heart size, mediastinal contours and pulmonary vascularity.

Lungs clear.

Minimal chronic peribronchial thickening.

No pleural effusion or pneumothorax.

Prior cervicothoracic fusion.
IMPRESSION: No acute abnormalities.

## 2015-06-23 ENCOUNTER — Encounter: Payer: Self-pay | Admitting: Family Medicine

## 2015-06-27 ENCOUNTER — Encounter: Payer: Self-pay | Admitting: Family Medicine

## 2015-06-27 MED ORDER — AMPHETAMINE-DEXTROAMPHET ER 20 MG PO CP24: 20.0000 mg | ORAL_CAPSULE | Freq: Every day | ORAL | Status: DC

## 2015-06-27 NOTE — Telephone Encounter (Signed)
Last OV 04/26/15 (sinus) adderall last filled 05/23/15 #30 with 0

## 2015-06-27 NOTE — Telephone Encounter (Signed)
Pt informed med is available at the front desk for pick up.

## 2015-07-06 ENCOUNTER — Encounter: Payer: Self-pay | Admitting: Neurology

## 2015-07-06 ENCOUNTER — Ambulatory Visit (INDEPENDENT_AMBULATORY_CARE_PROVIDER_SITE_OTHER): Payer: BLUE CROSS/BLUE SHIELD | Admitting: Neurology

## 2015-07-06 VITALS — BP 110/78 | HR 82 | Resp 20 | Ht 64.0 in | Wt 164.0 lb

## 2015-07-06 DIAGNOSIS — J208 Acute bronchitis due to other specified organisms: Secondary | ICD-10-CM

## 2015-07-06 DIAGNOSIS — G4733 Obstructive sleep apnea (adult) (pediatric): Secondary | ICD-10-CM | POA: Diagnosis not present

## 2015-07-06 DIAGNOSIS — J189 Pneumonia, unspecified organism: Secondary | ICD-10-CM

## 2015-07-06 MED ORDER — PROPRANOLOL HCL 20 MG PO TABS: ORAL_TABLET | ORAL | Status: DC
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
## 2015-07-06 NOTE — Patient Instructions (Signed)
The patient is needing to improve on her CPAP use.

## 2015-07-27 ENCOUNTER — Encounter: Payer: Self-pay | Admitting: Family Medicine

## 2015-07-27 MED ORDER — AMPHETAMINE-DEXTROAMPHET ER 20 MG PO CP24: 20.0000 mg | ORAL_CAPSULE | Freq: Every day | ORAL | Status: DC

## 2015-07-27 NOTE — Telephone Encounter (Signed)
Last OV 04/26/15 (sinus) adderall last filled 06/27/15 #30 with 0

## 2015-07-27 NOTE — Telephone Encounter (Signed)
Med filled an pt informed available at front desk for pick up.

## 2015-08-02 IMAGING — CT CT HEAD W/O CM
1 series · 16 of 30 positions shown, 20 images · non-contrast
Comparison: CT dated 02/07/2013 an MRI dated 01/20/2014

CLINICAL DATA: 44-year-old female with chronic headaches. History
of Chiari malformation.

EXAM:
CT HEAD WITHOUT CONTRAST
TECHNIQUE: Contiguous axial images were obtained from the base of the skull
through the vertex without intravenous contrast.

[Series 2: head 4.8 h37s · axial · 0.47mm/px · z∈[-151,-11]mm · 16 of 32 slices shown, 20 images]
[im 2/32  brain]
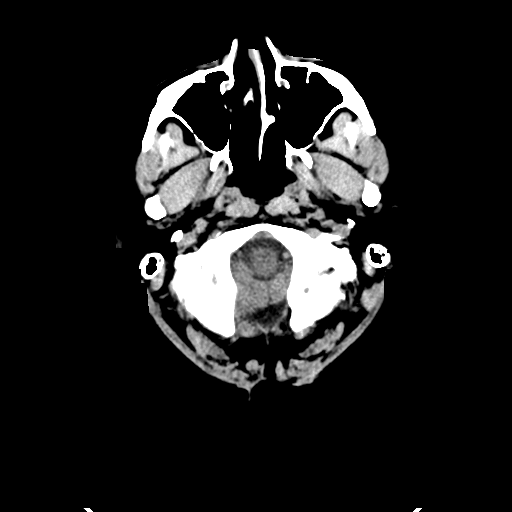
[im 2/32  bone]
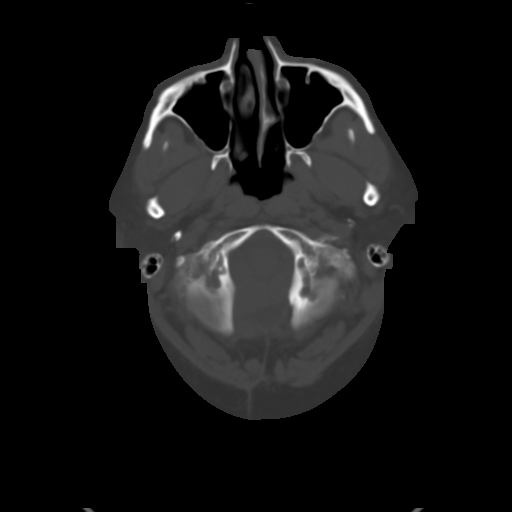
[im 4/32  brain]
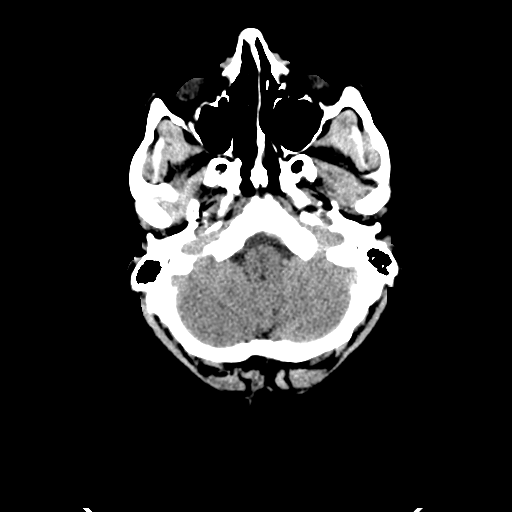
[im 6/32  brain]
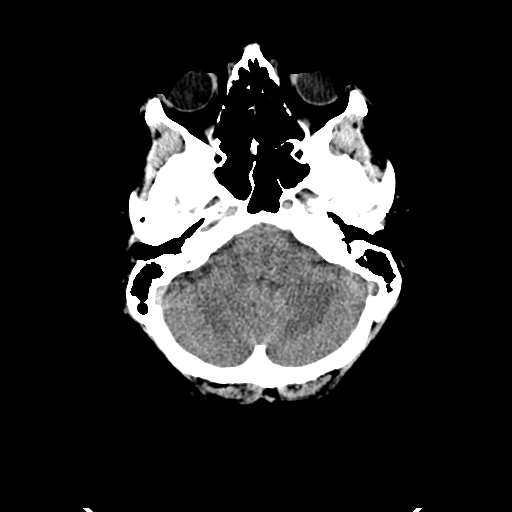
[im 8/32  brain]
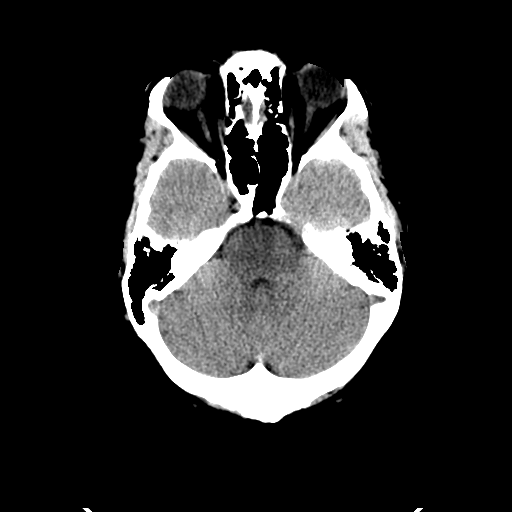
[im 9/32  brain]
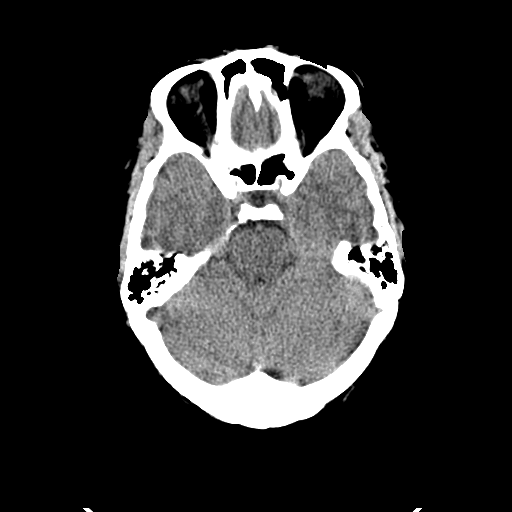
[im 9/32  bone]
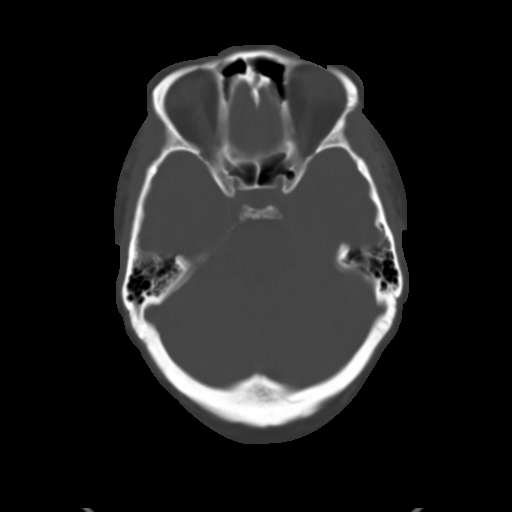
[im 11/32  brain]
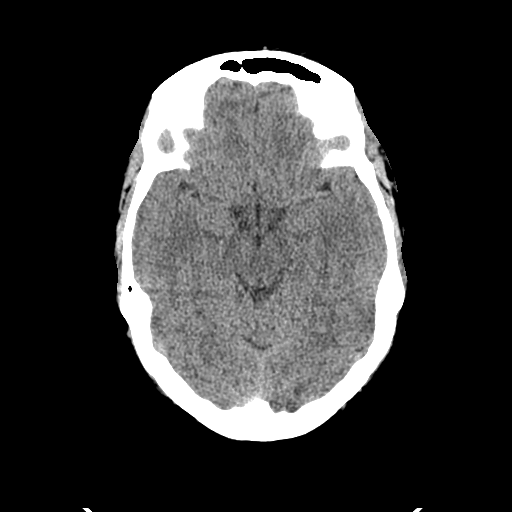
[im 13/32  brain]
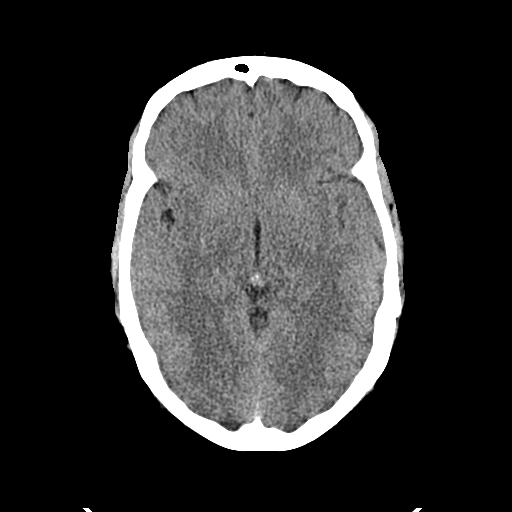
[im 15/32  brain]
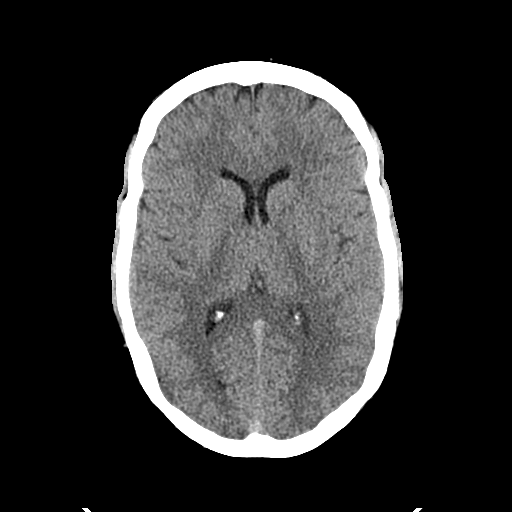
[im 17/32  brain]
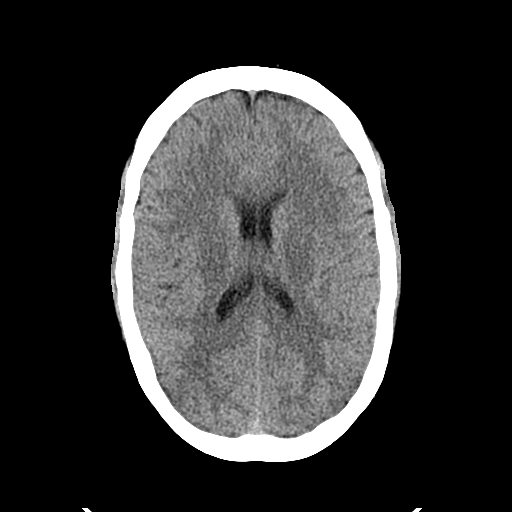
[im 17/32  bone]
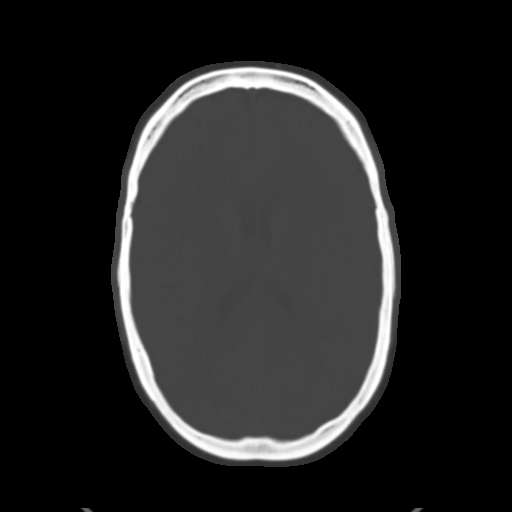
[im 19/32  brain]
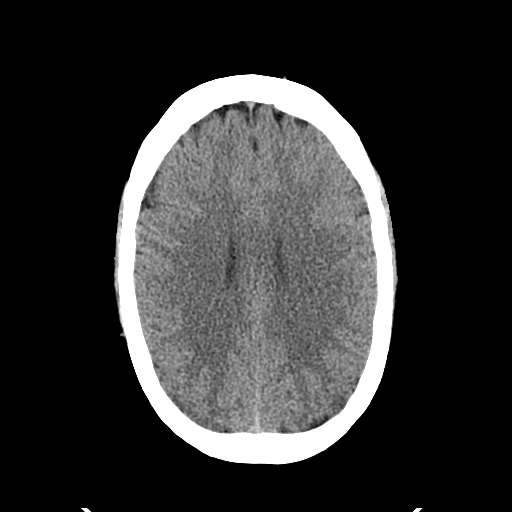
[im 21/32  brain]
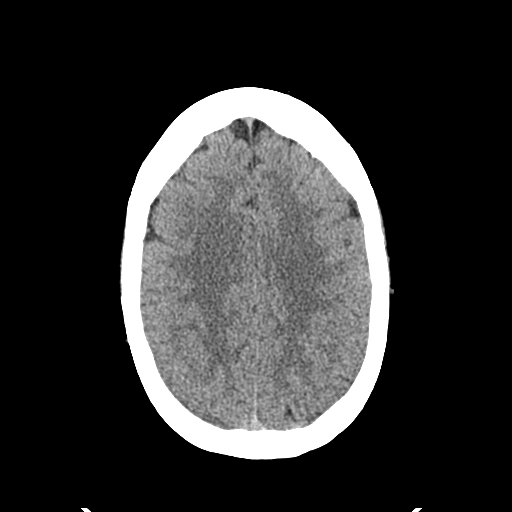
[im 23/32  brain]
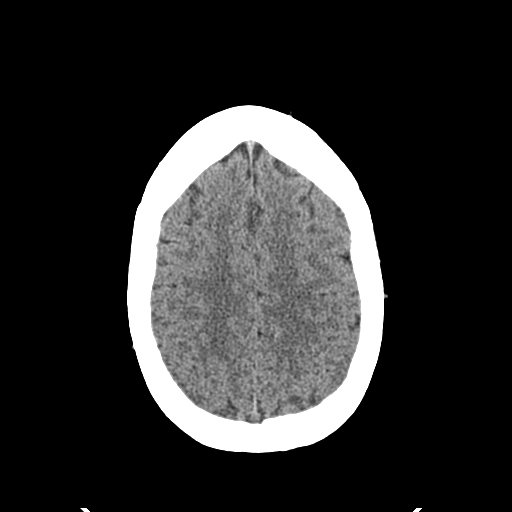
[im 24/32  brain]
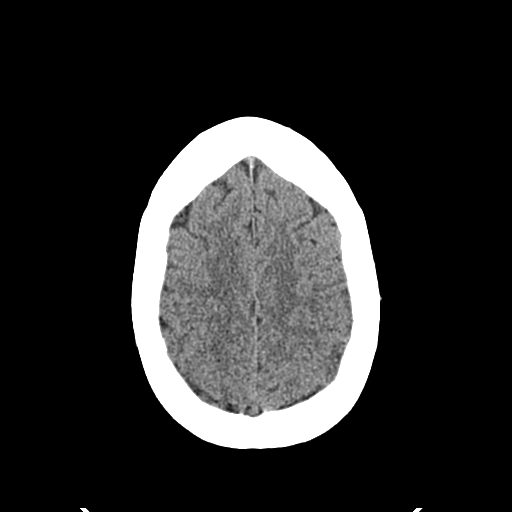
[im 24/32  bone]
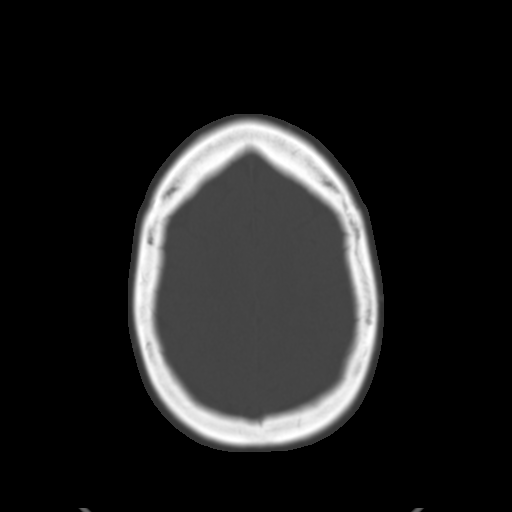
[im 26/32  brain]
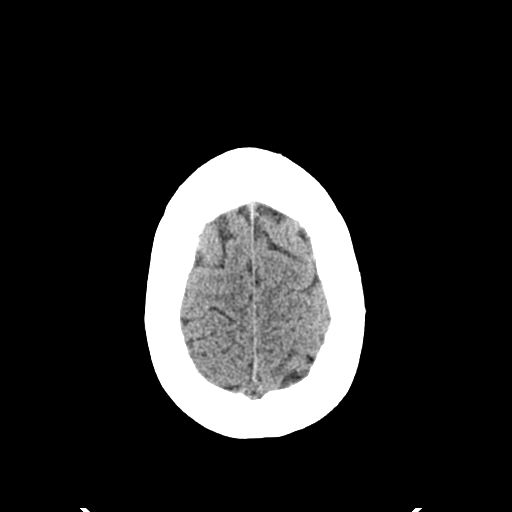
[im 28/32  brain]
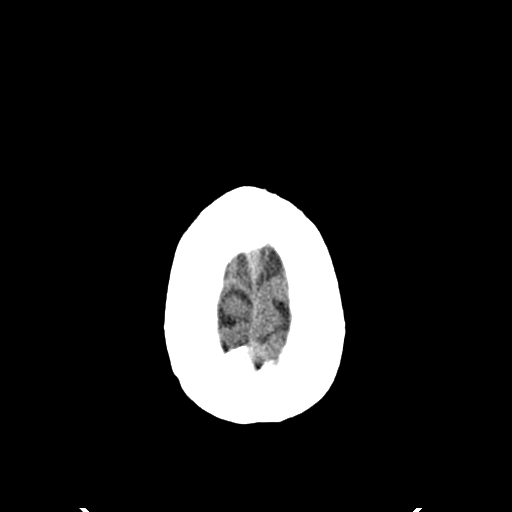
[im 30/32  brain]
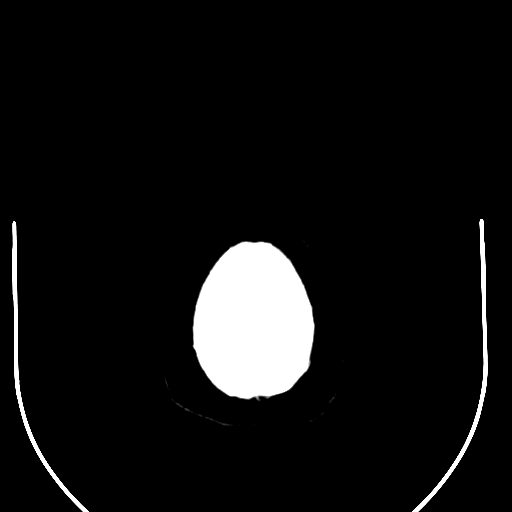

[16 of 30 positions shown; findings below may reference images not displayed]

FINDINGS: There is postsurgical changes of suboccipital decompression
craniectomy. There is crowding of the foramen magnum compatible with
known Chiari malformation. The ventricles and the sulci are
appropriate in size for the patient's age. There is no intracranial
hemorrhage. No midline shift or mass effect identified. The
gray-white matter differentiation is preserved.

The visualized paranasal sinuses and mastoid air cells are well
aerated.
IMPRESSION: No acute intracranial pathology.

Chiari malformation with suboccipital decompression craniectomy.

## 2015-08-10 ENCOUNTER — Encounter: Payer: Self-pay | Admitting: Family Medicine

## 2015-08-10 MED ORDER — CLONIDINE HCL 0.1 MG PO TABS: 0.1000 mg | ORAL_TABLET | Freq: Three times a day (TID) | ORAL | Status: DC

## 2015-08-10 NOTE — Telephone Encounter (Signed)
Medication filled to pharmacy as requested.   

## 2015-08-30 ENCOUNTER — Encounter: Payer: Self-pay | Admitting: Family Medicine

## 2015-08-30 MED ORDER — AMPHETAMINE-DEXTROAMPHET ER 20 MG PO CP24: 20.0000 mg | ORAL_CAPSULE | Freq: Every day | ORAL | Status: DC

## 2015-08-30 NOTE — Telephone Encounter (Signed)
Last OV 04/26/15 adderall last filled 07/27/15 #30 with 0

## 2015-09-20 ENCOUNTER — Encounter (HOSPITAL_BASED_OUTPATIENT_CLINIC_OR_DEPARTMENT_OTHER): Payer: Self-pay | Admitting: Emergency Medicine

## 2015-09-20 ENCOUNTER — Emergency Department (HOSPITAL_BASED_OUTPATIENT_CLINIC_OR_DEPARTMENT_OTHER)
Admission: EM | Admit: 2015-09-20 | Discharge: 2015-09-20 | Disposition: A | Discharge status: Home / Self Care | Payer: BLUE CROSS/BLUE SHIELD | Attending: Emergency Medicine | Admitting: Emergency Medicine

## 2015-09-20 ENCOUNTER — Emergency Department (HOSPITAL_BASED_OUTPATIENT_CLINIC_OR_DEPARTMENT_OTHER): Payer: BLUE CROSS/BLUE SHIELD

## 2015-09-20 DIAGNOSIS — R600 Localized edema: Secondary | ICD-10-CM | POA: Insufficient documentation

## 2015-09-20 DIAGNOSIS — R0789 Other chest pain: Secondary | ICD-10-CM | POA: Diagnosis present

## 2015-09-20 DIAGNOSIS — I498 Other specified cardiac arrhythmias: Secondary | ICD-10-CM | POA: Insufficient documentation

## 2015-09-20 DIAGNOSIS — G90A Postural orthostatic tachycardia syndrome (POTS): Secondary | ICD-10-CM

## 2015-09-20 DIAGNOSIS — I951 Orthostatic hypotension: Secondary | ICD-10-CM

## 2015-09-20 DIAGNOSIS — R0602 Shortness of breath: Secondary | ICD-10-CM | POA: Diagnosis not present

## 2015-09-20 DIAGNOSIS — R000 Tachycardia, unspecified: Secondary | ICD-10-CM

## 2015-09-20 LAB — URINALYSIS, ROUTINE W REFLEX MICROSCOPIC
Bilirubin Urine: NEGATIVE
Glucose, UA: NEGATIVE mg/dL
Hgb urine dipstick: NEGATIVE
Ketones, ur: 15 mg/dL — AB
Leukocytes, UA: NEGATIVE
Nitrite: NEGATIVE
Protein, ur: NEGATIVE mg/dL
Specific Gravity, Urine: 1.009 (ref 1.005–1.030)
pH: 5.5 (ref 5.0–8.0)

## 2015-09-20 LAB — BASIC METABOLIC PANEL
Anion gap: 11 (ref 5–15)
BUN: 14 mg/dL (ref 6–20)
CHLORIDE: 104 mmol/L (ref 101–111)
CO2: 25 mmol/L (ref 22–32)
CREATININE: 0.66 mg/dL (ref 0.44–1.00)
Calcium: 9.3 mg/dL (ref 8.9–10.3)
GFR calc non Af Amer: >60 mL/min (ref >60)
Glucose, Bld: 100 mg/dL — ABNORMAL HIGH (ref 65–99)
POTASSIUM: 3.6 mmol/L (ref 3.5–5.1)
SODIUM: 140 mmol/L (ref 135–145)

## 2015-09-20 LAB — D-DIMER, QUANTITATIVE: D-Dimer, Quant: 0.59 ug/mL-FEU — ABNORMAL HIGH (ref 0.00–0.50)

## 2015-09-20 LAB — CBC WITH DIFFERENTIAL/PLATELET
Basophils Absolute: 0 10*3/uL (ref 0.0–0.1)
Basophils Relative: 0 %
Eosinophils Absolute: 0.1 10*3/uL (ref 0.0–0.7)
Eosinophils Relative: 1 %
HCT: 41 % (ref 36.0–46.0)
Hemoglobin: 13.8 g/dL (ref 12.0–15.0)
Lymphocytes Relative: 22 %
Lymphs Abs: 1.4 10*3/uL (ref 0.7–4.0)
MCH: 28.9 pg (ref 26.0–34.0)
MCHC: 33.7 g/dL (ref 30.0–36.0)
MCV: 85.8 fL (ref 78.0–100.0)
Monocytes Absolute: 0.6 10*3/uL (ref 0.1–1.0)
Monocytes Relative: 10 %
Neutro Abs: 4.2 10*3/uL (ref 1.7–7.7)
Neutrophils Relative %: 67 %
Platelets: 227 10*3/uL (ref 150–400)
RBC: 4.78 MIL/uL (ref 3.87–5.11)
RDW: 13.6 % (ref 11.5–15.5)
WBC: 6.3 10*3/uL (ref 4.0–10.5)

## 2015-09-20 LAB — TROPONIN I
Troponin I: <0.03 ng/mL (ref <0.031)
Troponin I: <0.03 ng/mL (ref <0.031)

## 2015-09-20 MED ORDER — SODIUM CHLORIDE 0.9 % IV BOLUS (SEPSIS)
1000.0000 mL | Freq: Once | INTRAVENOUS | Status: AC
  Administered 2015-09-20: 1000 mL via INTRAVENOUS

## 2015-09-20 MED ORDER — ASPIRIN 81 MG PO CHEW
324.0000 mg | CHEWABLE_TABLET | Freq: Once | ORAL | Status: AC
  Administered 2015-09-20: 324 mg via ORAL
  Filled 2015-09-20: qty 4

## 2015-09-20 MED ORDER — IOPAMIDOL (ISOVUE-370) INJECTION 76%
100.0000 mL | Freq: Once | INTRAVENOUS | Status: AC | PRN
  Administered 2015-09-20: 100 mL via INTRAVENOUS

## 2015-09-20 NOTE — Discharge Instructions (Signed)
If your dizziness worsens or if your chest pain or dizziness does not go away and come back to the ER. Otherwise follow-up with your primary care doctor in 2 days or early next week.

## 2015-09-20 NOTE — ED Notes (Signed)
Went to assess pt who is currently on [facility] will attempt again to assess in 15 min.

## 2015-09-20 NOTE — ED Notes (Signed)
Mid sternal chest pressure that started at 1pm. No radiation.   Hx of same.  Sts she has POTS and feels like she is dehydrated.

## 2015-09-20 NOTE — ED Notes (Signed)
Patient transported to CT 

## 2015-09-20 NOTE — ED Provider Notes (Signed)
CSN: [id number]     Arrival date & time 09/20/15  1509 History   First MD Initiated Contact with Patient 09/20/15 1534     Chief Complaint  Patient presents with  . Chest Pain     (Consider location/radiation/quality/duration/timing/severity/associated sxs/prior Treatment) HPI  45 year old female with a history of POTS presents with recurrent chest pressure, palpitations, and lightheadedness. Feels similar to her typical flares. She gets these multiple times per week but this is worse than typical. However besides more severe it does not feel different. She states usually she gets a couple bags of IV fluids and feels better. When she lays flat she feels much better but standing up also the palpitations and the chest pressure to worsen. Chest pressure is moderate. It is similar to multiple prior exacerbations. She also feels some shortness of breath like she's been running. His morning she thought her legs/feet were swollen like a pooling blood. Chronic headache but not worse than typical. No other things are new such as diarrhea or poor oral intake. No nausea or vomiting. No weakness/numbness. No radiation of pain or back pain.  Past Medical History  Diagnosis Date  . Chiari I malformation ([place])   . Chicken pox   . Bronchitis   . Migraine   . Allergy   . GERD (gastroesophageal reflux disease)   . POTS (postural orthostatic tachycardia syndrome)   . C. difficile diarrhea   . Syncope   . Trigeminal neuralgia   . Ehlers-Danlos syndrome   . Mast cell disease   . Spina bifida occulta   . Other abnormality of brain or central nervous system function study     cranial cervical instability  . Sleep apnea with use of continuous positive airway pressure (CPAP)   . Scoliosis   . Raynaud's syndrome   . Intracranial hypertension    Past Surgical History  Procedure Laterality Date  . Craniectomy suboccipital w/ cervical laminectomy / chiari      pt has metal in her neck  . Abdominal  hysterectomy  2011  . Tonsillectomy and adenoidectomy  1975  . Tubal ligation  1989  . Achilles tendon lengthening  2009  . Achilles tendon repair  2010  . Chairi decompasion  2013  . Spinal cord surgery    . Acdf  2014    5,6,7   . Laminoplasty posterior cervical spine  06/2014    C2-C4   Family History  Problem Relation Age of Onset  . Arthritis Mother   . Breast cancer Mother   . Stroke Mother   . Hyperlipidemia Father   . Hypertension Father   . Diabetes Father   . Brain cancer Father   . Breast cancer Maternal Aunt   . Arthritis Maternal Grandmother   . Breast cancer Maternal Grandmother   . Stroke Maternal Grandfather   . Heart disease Maternal Grandfather   . Colon cancer Neg Hx   . Esophageal cancer Neg Hx   . Rectal cancer Neg Hx   . Stomach cancer Neg Hx    Social History  Substance Use Topics  . Smoking status: Never Smoker   . Smokeless tobacco: Never Used  . Alcohol Use: 0.0 oz/week    0 Standard drinks or equivalent per week     Comment: Rarely   OB History    No data available     Review of Systems  Respiratory: Positive for shortness of breath.   Cardiovascular: Positive for chest pain, palpitations and leg swelling.  Gastrointestinal: Negative for nausea, vomiting, abdominal pain and diarrhea.  Musculoskeletal: Negative for back pain.  Neurological: Positive for weakness and light-headedness.  All other systems reviewed and are negative.     Allergies  Levaquin; Adhesive; Gabapentin; Zoloft; and Latex  Home Medications   Prior to Admission medications   Medication Sig Start Date End Date Taking? Authorizing Provider  amphetamine-dextroamphetamine (ADDERALL XR) 20 MG 24 hr capsule Take 1 capsule (20 mg total) by mouth daily. 08/30/15   Darreld Mclean, MD  butalbital-acetaminophen-caffeine (FIORICET) 50-325-40 MG per tablet Take by mouth 2 (two) times daily as needed for headache. 1-2 tablets every 4 hours as needed for headaches.     Historical Provider, MD  cetirizine (ZYRTEC) 10 MG tablet Take 10 mg by mouth daily.    Historical Provider, MD  cloNIDine (CATAPRES) 0.1 MG tablet Take 1 tablet (0.1 mg total) by mouth 3 (three) times daily. Takes every 3 hours. 08/10/15   Midge Minium, MD  cromolyn (NASALCROM) 5.2 MG/ACT nasal spray Place 1 spray into the nose.    Historical Provider, MD  fludrocortisone (FLORINEF) 0.1 MG tablet Take 1 tablet (0.1 mg total) by mouth daily. 08/15/14   Asencion Partridge Dohmeier, MD  fluticasone (FLONASE) 50 MCG/ACT nasal spray Place 2 sprays into both nostrils daily. 01/30/15   Midge Minium, MD  midodrine (PROAMATINE) 10 MG tablet TAKE 1 TABLET BY MOUTH THREE TIMES DAILY 09/01/14   Midge Minium, MD  ondansetron [facility]) 4 MG tablet Take 4 mg by mouth every 8 (eight) hours as needed for nausea or vomiting.  07/15/13   Historical Provider, MD  propranolol (INDERAL) 20 MG tablet Once at night po - 07/06/15   Larey Seat, MD  ranitidine (ZANTAC) 150 MG tablet Take 150 mg by mouth daily.    Historical Provider, MD   BP 129/85 mmHg  Pulse 103  Temp(Src) 98.3 F (36.8 C) (Oral)  Resp 18  Ht 5\' 4"  (1.626 m)  Wt 154 lb (69.854 kg)  BMI 26.42 kg/m2  SpO2 100% Physical Exam  Constitutional: She is oriented to person, place, and time. She appears well-developed and well-nourished.  HENT:  Head: Normocephalic and atraumatic.  Right Ear: External ear normal.  Left Ear: External ear normal.  Nose: Nose normal.  Eyes: Right eye exhibits no discharge. Left eye exhibits no discharge.  Cardiovascular: Regular rhythm and normal heart sounds.  Tachycardia present.   Pulses:      Radial pulses are 2+ on the right side, and 2+ on the left side.       Dorsalis pedis pulses are 2+ on the right side, and 2+ on the left side.  Pulmonary/Chest: Effort normal and breath sounds normal.  Abdominal: Soft. She exhibits no distension. There is no tenderness.  Musculoskeletal: She exhibits no edema.   Neurological: She is alert and oriented to person, place, and time.  Skin: Skin is warm and dry.  Nursing note and vitals reviewed.   ED Course  Procedures (including critical care time) Labs Review Labs Reviewed  BASIC METABOLIC PANEL - Abnormal; Notable for the following:    Glucose, Bld 100 (*)    All other components within normal limits  D-DIMER, QUANTITATIVE (NOT AT [facility]) - Abnormal; Notable for the following:    D-Dimer, Quant 0.59 (*)    All other components within normal limits  URINALYSIS, ROUTINE W REFLEX MICROSCOPIC (NOT AT [facility]) - Abnormal; Notable for the following:    Ketones, ur 15 (*)  All other components within normal limits  CBC WITH DIFFERENTIAL/PLATELET  TROPONIN I  TROPONIN I    Imaging Review Dg Chest 2 View  09/20/2015  CLINICAL DATA:  Chest pain, dyspnea EXAM: CHEST  2 VIEW COMPARISON:  12/28/2014 FINDINGS: The heart size and mediastinal contours are within normal limits. Both lungs are clear. The visualized skeletal structures are unremarkable. IMPRESSION: No active cardiopulmonary disease. Electronically Signed   By: Kathreen Devoid   On: 09/20/2015 16:11   Ct Angio Chest Pe W/cm &/or Wo Cm  09/20/2015  CLINICAL DATA:  Shortness of breath, chest pain. Rule out pulmonary embolism. EXAM: CT ANGIOGRAPHY CHEST WITH CONTRAST TECHNIQUE: Multidetector CT imaging of the chest was performed using the standard protocol during bolus administration of intravenous contrast. Multiplanar CT image reconstructions and MIPs were obtained to evaluate the vascular anatomy. CONTRAST:  100 cc Isovue 370 COMPARISON:  None. FINDINGS: Mediastinum/Lymph Nodes: There is no pulmonary embolism within the main, lobar or segmental pulmonary arteries bilaterally. Thoracic aorta is normal in caliber and configuration. Heart size is normal. No pericardial effusion. No mass or enlarged lymph nodes within the mediastinum or perihilar regions. Lungs/Pleura: Lungs are clear. No pneumonia or other  consolidation. No pleural effusion or pneumothorax. Trachea and central bronchi are unremarkable. Upper abdomen: Limited images of the upper abdomen are unremarkable. Musculoskeletal: Anterior cervical fusion hardware noted within the lower cervical spine. No acute or suspicious osseous lesion. Stent is partially imaged within the right subclavian vein. Superficial soft tissues otherwise unremarkable. Review of the MIP images confirms the above findings. IMPRESSION: No acute findings.  No pulmonary embolism.  Lungs are clear. Electronically Signed   By: Franki Cabot M.D.   On: 09/20/2015 17:46   I have personally reviewed and evaluated these images and lab results as part of my medical decision-making.   EKG Interpretation   Date/Time:  Wednesday Sep 20 2015 15:17:17 EDT Ventricular Rate:  100 PR Interval:  113 QRS Duration: 96 QT Interval:  339 QTC Calculation: 437 R Axis:   75 Text Interpretation:  Sinus tachycardia Low voltage, precordial leads  Nonspecific T abnormalities, anterior leads ST/T wave abnormalities new  compared to 2016 Confirmed by Alexsandro Salek MD, [place] [phone]) on 09/20/2015  3:22:48 PM       EKG Interpretation  Date/Time:  Wednesday Sep 20 2015 17:43:54 EDT Ventricular Rate:  68 PR Interval:  119 QRS Duration: 100 QT Interval:  421 QTC Calculation: 448 R Axis:   61 Text Interpretation:  Sinus rhythm Borderline short PR interval Low voltage, precordial leads Abnormal R-wave progression, early transition T wave changes resolved compared to earlier Confirmed by [place] [phone]) on 09/20/2015 6:06:58 PM       MDM   Final diagnoses:  POTS (postural orthostatic tachycardia syndrome)  Chest pressure    Patient feels better although she states she is somewhat dizzy when she stands up. She requests a third liter of IV fluids. She states this chest pain is quite similar to multiple prior exacerbations of her POTS. Initial ECG shows some nonspecific T waves.  Possibly this was rate related, with repeat EKG now appears normal. I discussed this with patient. She is adamant that this is very similar to prior except that her symptoms are just lasting longer than normal. She does not want to be observed in the hospital despite feeling dizzy currently. She was to go home and try her oral meds that she has for her POTS. She will follow-up closely with her PCP.  Discussed importance of coming back if symptoms worsen or do not improve. While she does have continued chest pressure I doubt this is ACS. CT shows no PE or obvious signs of dissection.    Sherwood Gambler, MD 09/20/15 2350

## 2015-09-20 NOTE — ED Notes (Signed)
MD at bedside. 

## 2015-09-20 NOTE — ED Notes (Signed)
Called to room . Pt c/o left arm hurting above IV site. IVF rate slowed and warm compress applied. Dr. Regenia Skeeter made aware.

## 2015-09-20 NOTE — ED Notes (Signed)
Pt states she has a hx of POTS and feels dehydrated. C/O CP associated with same. Some "pooling of fluids" to LE per pt.

## 2015-09-20 NOTE — ED Notes (Signed)
Patient transported to X-ray 

## 2015-09-20 NOTE — ED Notes (Signed)
Returned from xray

## 2015-09-28 ENCOUNTER — Ambulatory Visit (INDEPENDENT_AMBULATORY_CARE_PROVIDER_SITE_OTHER): Payer: BLUE CROSS/BLUE SHIELD | Admitting: Family Medicine

## 2015-09-28 ENCOUNTER — Encounter: Payer: Self-pay | Admitting: Family Medicine

## 2015-09-28 VITALS — BP 118/80 | HR 79 | Temp 98.0°F | Resp 16 | Ht 64.0 in | Wt 157.4 lb

## 2015-09-28 DIAGNOSIS — I498 Other specified cardiac arrhythmias: Secondary | ICD-10-CM

## 2015-09-28 DIAGNOSIS — G90A Postural orthostatic tachycardia syndrome (POTS): Secondary | ICD-10-CM

## 2015-09-28 DIAGNOSIS — Z0000 Encounter for general adult medical examination without abnormal findings: Secondary | ICD-10-CM | POA: Diagnosis not present

## 2015-09-28 DIAGNOSIS — R000 Tachycardia, unspecified: Secondary | ICD-10-CM | POA: Diagnosis not present

## 2015-09-28 DIAGNOSIS — I951 Orthostatic hypotension: Secondary | ICD-10-CM

## 2015-09-28 LAB — HEPATIC FUNCTION PANEL
ALK PHOS: 74 U/L (ref 33–115)
ALT: 10 U/L (ref 6–29)
AST: 17 U/L (ref 10–35)
Albumin: 4.3 g/dL (ref 3.6–5.1)
Bilirubin, Direct: 0.1 mg/dL (ref <=0.2)
Indirect Bilirubin: 0.2 mg/dL (ref 0.2–1.2)
TOTAL PROTEIN: 6.6 g/dL (ref 6.1–8.1)
Total Bilirubin: 0.3 mg/dL (ref 0.2–1.2)

## 2015-09-28 LAB — BASIC METABOLIC PANEL
BUN: 12 mg/dL (ref 7–25)
CHLORIDE: 102 mmol/L (ref 98–110)
CO2: 26 mmol/L (ref 20–31)
CREATININE: 0.68 mg/dL (ref 0.50–1.10)
Calcium: 9.2 mg/dL (ref 8.6–10.2)
Glucose, Bld: 63 mg/dL — ABNORMAL LOW (ref 65–99)
POTASSIUM: 4.1 mmol/L (ref 3.5–5.3)
Sodium: 140 mmol/L (ref 135–146)

## 2015-09-28 LAB — LIPID PANEL
CHOLESTEROL: 184 mg/dL (ref 125–200)
HDL: 59 mg/dL (ref >=46)
LDL Cholesterol: 116 mg/dL (ref <130)
TRIGLYCERIDES: 45 mg/dL (ref <150)
Total CHOL/HDL Ratio: 3.1 Ratio (ref <=5.0)
VLDL: 9 mg/dL (ref <30)

## 2015-09-28 LAB — CBC WITH DIFFERENTIAL/PLATELET
BASOS ABS: 0 {cells}/uL (ref 0–200)
Basophils Relative: 0 %
EOS PCT: 1 %
Eosinophils Absolute: 52 cells/uL (ref 15–500)
HEMATOCRIT: 40.5 % (ref 35.0–45.0)
HEMOGLOBIN: 13.7 g/dL (ref 11.7–15.5)
LYMPHS PCT: 27 %
Lymphs Abs: 1404 cells/uL (ref 850–3900)
MCH: 28.9 pg (ref 27.0–33.0)
MCHC: 33.8 g/dL (ref 32.0–36.0)
MCV: 85.4 fL (ref 80.0–100.0)
MONOS PCT: 11 %
MPV: 10 fL (ref 7.5–12.5)
Monocytes Absolute: 572 cells/uL (ref 200–950)
NEUTROS PCT: 61 %
Neutro Abs: 3172 cells/uL (ref 1500–7800)
PLATELETS: 245 10*3/uL (ref 140–400)
RBC: 4.74 MIL/uL (ref 3.80–5.10)
RDW: 13.9 % (ref 11.0–15.0)
WBC: 5.2 10*3/uL (ref 3.8–10.8)

## 2015-09-28 LAB — TSH: TSH: 1.86 mIU/L

## 2015-09-28 MED ORDER — AMPHETAMINE-DEXTROAMPHET ER 20 MG PO CP24: 20.0000 mg | ORAL_CAPSULE | Freq: Every day | ORAL | Status: DC

## 2015-09-28 NOTE — Progress Notes (Signed)
   Subjective:    Patient ID: Jamie Baker, female    DOB: 12-29-1970, 45 y.o.   MRN: [id number]  HPI CPE- UTD on colonoscopy, no need for pap due to hysterectomy.  Due for [place].  Pt has changed her eating habits for the better.  Tdap done 2011.   Review of Systems Patient reports no vision changes, adenopathy,fever, weight change,  persistant/recurrent hoarseness , swallowing issues, edema, persistant/recurrent cough, hemoptysis, dyspnea (rest/exertional/paroxysmal nocturnal), gastrointestinal bleeding (melena, rectal bleeding), abdominal pain, significant heartburn, bowel changes, GU symptoms (dysuria, hematuria, incontinence), Gyn symptoms (abnormal  bleeding, pain), focal weakness, memory loss, numbness & tingling, skin/hair/nail changes, abnormal bruising or bleeding, anxiety, or depression.   + palpitations in setting of POTS- needs referral to Cards + tinnitus + CP and SOB when having tachycardia in setting of POTS + syncope w/ POTS    Objective:   Physical Exam General Appearance:    Alert, cooperative, no distress, appears stated age  Head:    Normocephalic, without obvious abnormality, atraumatic  Eyes:    PERRL, conjunctiva/corneas clear, EOM's intact, fundi    benign, both eyes  Ears:    Normal TM's and external ear canals, both ears  Nose:   Nares normal, septum midline, mucosa normal, no drainage    or sinus tenderness  Throat:   Lips, mucosa, and tongue normal; teeth and gums normal  Neck:   Supple, symmetrical, trachea midline, no adenopathy;    Thyroid: no enlargement/tenderness/nodules  Back:     Symmetric, no curvature, ROM normal, no CVA tenderness  Lungs:     Clear to auscultation bilaterally, respirations unlabored  Chest Wall:    No tenderness or deformity   Heart:    Regular rate and rhythm, S1 and S2 normal, no murmur, rub   or gallop  Breast Exam:    Deferred to mammo  Abdomen:     Soft, non-tender, bowel sounds active all four  quadrants,    no masses, no organomegaly  Genitalia:    Deferred  Rectal:    Extremities:   Extremities normal, atraumatic, no cyanosis or edema  Pulses:   2+ and symmetric all extremities  Skin:   Skin color, texture, turgor normal, no rashes or lesions  Lymph nodes:   Cervical, supraclavicular, and axillary nodes normal  Neurologic:   CNII-XII intact, normal strength, sensation and reflexes    throughout          Assessment & Plan:

## 2015-09-28 NOTE — Progress Notes (Signed)
Pre visit review using our clinic review tool, if applicable. No additional management support is needed unless otherwise documented below in the visit note. 

## 2015-09-28 NOTE — Patient Instructions (Signed)
Follow up in 1 year or as needed We'll notify you of your lab results and make any changes if needed Continue to make healthy food choices and exercise as able Drink plenty of fluids We'll call you with your cardiology appt Call and schedule your mammogram Call with any questions or concerns Have a great trip!!!

## 2015-09-29 LAB — VITAMIN D 25 HYDROXY (VIT D DEFICIENCY, FRACTURES): Vit D, 25-Hydroxy: 28 ng/mL — ABNORMAL LOW (ref 30–100)

## 2015-10-01 NOTE — Assessment & Plan Note (Signed)
Pt continues to struggle w/ this.  Will refer to cards for complete evaluation and additional tx.  Pt expressed understanding and is in agreement w/ plan.

## 2015-10-01 NOTE — Assessment & Plan Note (Signed)
Pt's PE WNL.  Due for mammo- pt plans to schedule.  No need for pap due to hysterectomy.  Check labs.  Anticipatory guidance provided.

## 2015-10-17 ENCOUNTER — Ambulatory Visit: Payer: BLUE CROSS/BLUE SHIELD | Admitting: Neurology

## 2015-10-30 ENCOUNTER — Encounter: Payer: Self-pay | Admitting: Family Medicine

## 2015-10-30 ENCOUNTER — Ambulatory Visit (INDEPENDENT_AMBULATORY_CARE_PROVIDER_SITE_OTHER): Payer: BLUE CROSS/BLUE SHIELD | Admitting: Family Medicine

## 2015-10-30 VITALS — BP 112/78 | HR 78 | Temp 98.0°F | Resp 16 | Ht 64.0 in | Wt 157.1 lb

## 2015-10-30 DIAGNOSIS — R05 Cough: Secondary | ICD-10-CM | POA: Diagnosis not present

## 2015-10-30 DIAGNOSIS — R058 Other specified cough: Secondary | ICD-10-CM

## 2015-10-30 MED ORDER — ALBUTEROL SULFATE HFA 108 (90 BASE) MCG/ACT IN AERS: 2.0000 | INHALATION_SPRAY | RESPIRATORY_TRACT | Status: DC | PRN

## 2015-10-30 NOTE — Progress Notes (Signed)
Pre visit review using our clinic review tool, if applicable. No additional management support is needed unless otherwise documented below in the visit note. 

## 2015-10-30 NOTE — Patient Instructions (Signed)
Follow up as needed This is a post-infectious cough due to airway inflammation Use the Albuterol inhaler-2 puffs every 4 hrs as needed for coughing fit or shortness of breath Start 1-2 ibuprofen at meals until cough improves (to help airway inflammation) Drink plenty of fluids Call with any questions or concerns Hang in there!!!

## 2015-10-30 NOTE — Progress Notes (Signed)
   Subjective:    Patient ID: Jamie Baker, female    DOB: [birth year], 45 y.o.   MRN: [id number]  HPI Cough- pt reports sxs started when she returned from vacation 3 weeks ago.  Went through 2 bottles of mucinex and congestion is better but cough continues.  No fevers.  No sinus pain/pressure.  Cough is intermittently productive.  Mild ear discomfort.  Continues to use allergy medication daily.  No N/V.  No tooth pain.   Review of Systems For ROS see HPI     Objective:   Physical Exam  Constitutional: She is oriented to person, place, and time. She appears well-developed and well-nourished. No distress.  HENT:  Head: Normocephalic and atraumatic.  Right Ear: Tympanic membrane normal.  Left Ear: Tympanic membrane normal.  Nose: Mucosal edema and rhinorrhea present. Right sinus exhibits no maxillary sinus tenderness and no frontal sinus tenderness. Left sinus exhibits no maxillary sinus tenderness and no frontal sinus tenderness.  Mouth/Throat: Mucous membranes are normal. Posterior oropharyngeal erythema (w/ PND) present.  Eyes: Conjunctivae and EOM are normal. Pupils are equal, round, and reactive to light.  Neck: Normal range of motion. Neck supple.  Cardiovascular: Normal rate, regular rhythm and normal heart sounds.   Pulmonary/Chest: Effort normal and breath sounds normal. No respiratory distress. She has no wheezes. She has no rales.  No cough during visit  Lymphadenopathy:    She has no cervical adenopathy.  Neurological: She is alert and oriented to person, place, and time.  Skin: Skin is warm and dry.  Psychiatric: She has a normal mood and affect. Her behavior is normal. Thought content normal.  Vitals reviewed.         Assessment & Plan:  Post-infectious cough- no evidence of bacterial infxn today so no need for abx.  Start albuterol inhaler as pt has used this in the past.  Ibuprofen for airway inflammation.  Pt has remaining promethazine cough syrup- she is to use this  as needed.  Reviewed supportive care and red flags that should prompt return.  Pt expressed understanding and is in agreement w/ plan.

## 2015-11-08 ENCOUNTER — Encounter: Payer: Self-pay | Admitting: Family Medicine

## 2015-11-08 MED ORDER — AMPHETAMINE-DEXTROAMPHET ER 20 MG PO CP24: 20.0000 mg | ORAL_CAPSULE | Freq: Every day | ORAL | Status: DC

## 2015-11-08 NOTE — Telephone Encounter (Signed)
Last OV 10/30/15 adderall last filled 09/28/15 #30 with 0

## 2015-11-08 NOTE — Telephone Encounter (Signed)
Med filled and pt advised available at front desk for pick up.  

## 2015-11-16 ENCOUNTER — Ambulatory Visit (INDEPENDENT_AMBULATORY_CARE_PROVIDER_SITE_OTHER): Payer: BLUE CROSS/BLUE SHIELD | Admitting: Internal Medicine

## 2015-11-16 ENCOUNTER — Encounter: Payer: Self-pay | Admitting: Internal Medicine

## 2015-11-16 VITALS — BP 130/76 | HR 92 | Ht 64.0 in | Wt 156.6 lb

## 2015-11-16 DIAGNOSIS — G909 Disorder of the autonomic nervous system, unspecified: Secondary | ICD-10-CM

## 2015-11-16 DIAGNOSIS — G901 Familial dysautonomia [Riley-Day]: Secondary | ICD-10-CM

## 2015-11-16 NOTE — Patient Instructions (Signed)
Medication Instructions: - Your physician recommends that you continue on your current medications as directed. Please refer to the Current Medication list given to you today.  Labwork: - none  Procedures/Testing: - none  Follow-Up: - Dr. Klein will see you back on an as needed basis.  Any Additional Special Instructions Will Be Listed Below (If Applicable).     If you need a refill on your cardiac medications before your next appointment, please call your pharmacy.   

## 2015-11-16 NOTE — Progress Notes (Signed)
ELECTROPHYSIOLOGY CONSULT NOTE  Patient ID: Jamie Baker, MRN: [id number], DOB/AGE: [birth year] 45 y.o. Admit date: (Not on file) Date of Consult: 11/16/2015  Primary Physician: Annye Asa, MD Primary Cardiologist: new  Consulting Physician Birdie Riddle  Chief Complaint: POTS   HPI Jamie Baker is a 45 y.o. female  Referred because of a diagnosis of POTS. 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 At that point she was treated with a combination of Florinef and ProAmatine and clonidine which she has been maintained on apart from her recent hiatus from the Florinef that was associated with a worsening of symptoms and improvement with its reinstitution  She has had no other cardiac workup that she can outlined.  She has a complex neurosurgical history. 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 She has had cervical fusion surgery;  she's had a tethered spinal cord release.  She carries a diagnosis of Ehlers-Danlos syndrome and underwent stenting of her jugular venous system at the [facility] was associated with improvement in the discoloration of her lower extremities.  She has frequent chest pain. These spells can last hour or 2 at a time. They're not positional and unrelated to exertion.  Notes from care everywhere described Chiari malformation   Past Medical History  Diagnosis Date  . Chiari I malformation ([place])   . Chicken pox   . Bronchitis   . Migraine   . Allergy   . GERD (gastroesophageal reflux disease)   . POTS (postural orthostatic tachycardia syndrome)   . C. difficile diarrhea   . Syncope   . Trigeminal neuralgia   . Ehlers-Danlos syndrome   . Mast cell disease   . Spina bifida occulta   . Other abnormality of brain or central nervous system function study     cranial cervical instability  . Sleep apnea with use of continuous positive airway pressure  (CPAP)   . Scoliosis   . Raynaud's syndrome   . Intracranial hypertension       Surgical History:  Past Surgical History  Procedure Laterality Date  . Craniectomy suboccipital w/ cervical laminectomy / chiari      pt has metal in her neck  . Abdominal hysterectomy  2011  . Tonsillectomy and adenoidectomy  1975  . Tubal ligation  1989  . Achilles tendon lengthening  2009  . Achilles tendon repair  2010  . Chairi decompasion  2013  . Spinal cord surgery    . Acdf  2014    5,6,7   . Laminoplasty posterior cervical spine  06/2014    C2-C4     Home Meds: Prior to Admission medications   Medication Sig Start Date End Date Taking? Authorizing Provider  propranolol (INDERAL) 20 MG tablet Take 20 mg by mouth at bedtime.   Yes Historical Provider, MD  albuterol (PROAIR HFA) 108 (90 Base) MCG/ACT inhaler Inhale 2 puffs into the lungs every 4 (four) hours as needed for wheezing or shortness of breath. 10/30/15   Midge Minium, MD  amphetamine-dextroamphetamine (ADDERALL XR) 20 MG 24 hr capsule Take 1 capsule (20 mg total) by mouth daily. 11/08/15   Midge Minium, MD  butalbital-acetaminophen-caffeine (FIORICET) [phone] MG per tablet Take by mouth 2 (two) times daily as needed for headache. 1-2 tablets every 4 hours as needed for headaches.    Historical Provider, MD  cetirizine (ZYRTEC) 10 MG tablet Take 10 mg by mouth daily.  Historical Provider, MD  cloNIDine (CATAPRES) 0.1 MG tablet Take 1 tablet (0.1 mg total) by mouth 3 (three) times daily. Takes every 3 hours. 08/10/15   Midge Minium, MD  cromolyn (NASALCROM) 5.2 MG/ACT nasal spray Place 1 spray into the nose as directed.     Historical Provider, MD  fludrocortisone (FLORINEF) 0.1 MG tablet Take 1 tablet (0.1 mg total) by mouth daily. 08/15/14   Asencion Partridge Dohmeier, MD  fluticasone (FLONASE) 50 MCG/ACT nasal spray Place 2 sprays into both nostrils daily. 01/30/15   Midge Minium, MD  ketorolac (TORADOL) 10 MG tablet   10/26/15   Historical Provider, MD  midodrine (PROAMATINE) 10 MG tablet TAKE 1 TABLET BY MOUTH THREE TIMES DAILY Patient not taking: Reported on 10/30/2015 09/01/14   Midge Minium, MD  ondansetron (ZOFRAN) 4 MG tablet Take 4 mg by mouth every 8 (eight) hours as needed for nausea or vomiting.  07/15/13   Historical Provider, MD  ranitidine (ZANTAC) 150 MG tablet Take 150 mg by mouth daily.    Historical Provider, MD    Allergies:  Allergies  Allergen Reactions  . Levaquin [Levofloxacin In D5w]     Ruptured ache lis tendons    . Adhesive [Tape]     unknown  . Gabapentin Other (See Comments)    tremors  . Zoloft [Sertraline Hcl] Other (See Comments)    hallucinations  . Latex Rash    Social History   Social History  . Marital Status: Married    Spouse Name: N/A  . Number of Children: 3  . Years of Education: MBA   Occupational History  . Accountant    Social History Main Topics  . Smoking status: Never Smoker   . Smokeless tobacco: Never Used  . Alcohol Use: 0.0 oz/week    0 Standard drinks or equivalent per week     Comment: Rarely  . Drug Use: No  . Sexual Activity: Not on file   Other Topics Concern  . Not on file   Social History Narrative   Right handed.  Work- Public house manager, Married, 3 kids.     Caffeine 1 glass daily.     Family History  Problem Relation Age of Onset  . Arthritis Mother   . Breast cancer Mother   . Stroke Mother   . Hyperlipidemia Father   . Hypertension Father   . Diabetes Father   . Brain cancer Father   . Breast cancer Maternal Aunt   . Arthritis Maternal Grandmother   . Breast cancer Maternal Grandmother   . Stroke Maternal Grandfather   . Heart disease Maternal Grandfather   . Colon cancer Neg Hx   . Esophageal cancer Neg Hx   . Rectal cancer Neg Hx   . Stomach cancer Neg Hx      ROS:  Please see the history of present illness.     All other systems reviewed and negative.    Physical Exam: Blood pressure 130/76,  pulse 92, height 5\' 4"  (1.626 m), weight 156 lb 9.6 oz (71.033 kg). General: Well developed, well nourished female in no acute distress. Head: Normocephalic, atraumatic, sclera non-icteric, no xanthomas, nares are without discharge. EENT: normal  Lymph Nodes:  none Neck: Negative for carotid bruits. JVD not elevated. Back:without scoliosis kyphosis Lungs: Clear bilaterally to auscultation without wheezes, rales, or rhonchi. Breathing is unlabored. Heart: RRR with S1 S2. No   murmur . No rubs, or gallops appreciated. Abdomen: Soft, non-tender, non-distended with normoactive bowel  sounds. No hepatomegaly. No rebound/guarding. No obvious abdominal masses. Msk:  Strength and tone appear normal for age. Extremities: No clubbing or cyanosis. No edema.  Distal pedal pulses are 2+ and equal bilaterally. Skin: Warm and Dry Neuro: Alert and oriented X 3. CN III-XII intact Grossly normal sensory and motor function . Psych:  Responds to questions appropriately with a normal affect.      Labs: Cardiac Enzymes No results for input(s): CKTOTAL, CKMB, TROPONINI in the last 72 hours. CBC Lab Results  Component Value Date   WBC 5.2 09/28/2015   HGB 13.7 09/28/2015   HCT 40.5 09/28/2015   MCV 85.4 09/28/2015   PLT 245 09/28/2015   PROTIME: No results for input(s): LABPROT, INR in the last 72 hours. Chemistry No results for input(s): NA, K, CL, CO2, BUN, CREATININE, CALCIUM, PROT, BILITOT, ALKPHOS, ALT, AST, GLUCOSE in the last 168 hours.  Invalid input(s): LABALBU Lipids Lab Results  Component Value Date   CHOL 184 09/28/2015   HDL 59 09/28/2015   LDLCALC 116 09/28/2015   TRIG 45 09/28/2015   BNP No results found for: PROBNP Thyroid Function Tests: No results for input(s): TSH, T4TOTAL, T3FREE, THYROIDAB in the last 72 hours.  Invalid input(s): FREET3 Miscellaneous Lab Results  Component Value Date   DDIMER 0.59* 09/20/2015    Radiology/Studies:  No results found.  EKG: NSR  with TWave inversion   Assessment and Plan: POTS-- by history  Chiari malformation stage I  Intracranial hypertension with anticipated VP shunt  History of tethered cord with surgical release  Ehlers-Danlos syndrome  Chest pain  Palpitations  Abnormal ECG  I suggested an [employer] monitor might be useful for elucidating her palpitations  I do not think her chest pain is cardiac and would not pursue a cardiac workup for it  The patient has a complex assortment of diagnoses. Unfortunately they extend far beyond level of expertise and I wonder about the interplay between the described pathologies, the surgical interventions, and her ongoing symptoms.  Practically I have suggested that she shower at night instead of in the morning. I think it is reasonable to continue on the medications at others of started.  I strongly suggested however that she might benefit from a unified approach given the complexities outlined in the perhaps [facility] might allow for a concerted care strategy  I will be available to help in the context of a collaborative plan but I am reluctant to weigh in for the reasons outlined in the first paragraph  The Twave inversions may represent consequences of her intracranial processes; but would readdress this following the anticpated procedure     Virl Axe

## 2015-11-18 HISTORY — PX: VENTRICULOPERITONEAL SHUNT: SHX204

## 2015-11-30 ENCOUNTER — Ambulatory Visit (INDEPENDENT_AMBULATORY_CARE_PROVIDER_SITE_OTHER): Payer: BLUE CROSS/BLUE SHIELD | Admitting: Family Medicine

## 2015-11-30 ENCOUNTER — Encounter: Payer: Self-pay | Admitting: Family Medicine

## 2015-11-30 VITALS — BP 120/80 | HR 96 | Temp 98.9°F | Resp 17 | Ht 64.0 in | Wt 158.4 lb

## 2015-11-30 DIAGNOSIS — Z982 Presence of cerebrospinal fluid drainage device: Secondary | ICD-10-CM | POA: Diagnosis not present

## 2015-11-30 DIAGNOSIS — B373 Candidiasis of vulva and vagina: Secondary | ICD-10-CM | POA: Diagnosis not present

## 2015-11-30 DIAGNOSIS — B3731 Acute candidiasis of vulva and vagina: Secondary | ICD-10-CM

## 2015-11-30 MED ORDER — FLUDROCORTISONE ACETATE 0.1 MG PO TABS: 0.1000 mg | ORAL_TABLET | Freq: Every day | ORAL | Status: DC

## 2015-11-30 MED ORDER — FLUCONAZOLE 150 MG PO TABS: 150.0000 mg | ORAL_TABLET | Freq: Once | ORAL | Status: DC

## 2015-11-30 NOTE — Progress Notes (Signed)
   Subjective:    Patient ID: Jamie Baker, female    DOB: 12/01/1970, 45 y.o.   MRN: [id number]  HPI Suture removal- pt had VP shunt at [facility] on 11/23/15.  She is here today for suture and staple removal.  Pt had difficulty getting pain medication b/c script was written in [place].  Pt was able to get meds yesterday and this is helping considerably.  No drainage or evidence of infxn at incision sites.  Pt is developing yeast due to abx from surgery- asking for Diflucan.   Review of Systems For ROS see HPI     Objective:   Physical Exam  Constitutional: She is oriented to person, place, and time. She appears well-developed and well-nourished. No distress.  HENT:  Head: Normocephalic.  Well healing semicircular incision of scalp at R hairline- 11 staples present and removed today w/o difficulty. 2 staples posterior to R ear- removed  Abdominal: Soft. Bowel sounds are normal. She exhibits no distension. There is no tenderness. There is no guarding.  7 midline staples over well healing incision  Neurological: She is alert and oriented to person, place, and time.  Skin: Skin is warm and dry. There is erythema (redness over abdomen due to adhesive reaction).  Psychiatric: She has a normal mood and affect. Her behavior is normal. Thought content normal.  Vitals reviewed.         Assessment & Plan:  Staple removal- all 3 incisions were healing well w/o evidence of infxn.  All staples were removed w/o difficulty.  Wound care instructions reviewed w/ pt.  Yeast infxn- pt now has vaginitis due to recent abx use.  Diflucan prescribed.

## 2015-11-30 NOTE — Progress Notes (Signed)
Pre visit review using our clinic review tool, if applicable. No additional management support is needed unless otherwise documented below in the visit note. 

## 2015-11-30 NOTE — Patient Instructions (Signed)
Follow up as needed Take the Diflucan for yeast You are now able to shower and wash your hair- just pat dry! Call with any questions or concerns Have a great summer!!

## 2015-12-13 ENCOUNTER — Encounter: Payer: Self-pay | Admitting: Family Medicine

## 2015-12-13 MED ORDER — AMPHETAMINE-DEXTROAMPHET ER 20 MG PO CP24: 20.0000 mg | ORAL_CAPSULE | Freq: Every day | ORAL | 0 refills | Status: DC

## 2015-12-13 NOTE — Telephone Encounter (Signed)
Last OV 11/30/15 adderall last filled 11/08/15 #30 with 0

## 2015-12-28 ENCOUNTER — Other Ambulatory Visit: Payer: Self-pay | Admitting: Family Medicine

## 2016-01-12 ENCOUNTER — Encounter: Payer: Self-pay | Admitting: Family Medicine

## 2016-01-12 MED ORDER — AMPHETAMINE-DEXTROAMPHET ER 20 MG PO CP24: 20.0000 mg | ORAL_CAPSULE | Freq: Every day | ORAL | 0 refills | Status: DC

## 2016-01-12 NOTE — Telephone Encounter (Signed)
Last OV 11/30/15 adderall last filled 12/13/15 #30 with 0

## 2016-01-12 NOTE — Telephone Encounter (Signed)
Med filled and pt made aware available at front desk for pick up.   

## 2016-02-14 ENCOUNTER — Encounter: Payer: Self-pay | Admitting: Family Medicine

## 2016-02-14 MED ORDER — AMPHETAMINE-DEXTROAMPHET ER 20 MG PO CP24: 20.0000 mg | ORAL_CAPSULE | Freq: Every day | ORAL | 0 refills | Status: DC

## 2016-02-14 NOTE — Telephone Encounter (Signed)
Last OV 11/30/15 adderall last filled 01/12/16 #30 with 0

## 2016-03-04 ENCOUNTER — Encounter: Payer: Self-pay | Admitting: Family Medicine

## 2016-03-04 MED ORDER — PREDNISONE 10 MG PO TABS: ORAL_TABLET | ORAL | 0 refills | Status: DC

## 2016-03-14 ENCOUNTER — Encounter: Payer: Self-pay | Admitting: Family Medicine

## 2016-03-14 MED ORDER — AMPHETAMINE-DEXTROAMPHET ER 20 MG PO CP24: 20.0000 mg | ORAL_CAPSULE | Freq: Every day | ORAL | 0 refills | Status: DC

## 2016-03-14 NOTE — Telephone Encounter (Signed)
Last OV 11/30/15 adderall last filled 02/14/16 #30 with 0

## 2016-03-15 ENCOUNTER — Encounter: Payer: Self-pay | Admitting: Family Medicine

## 2016-03-15 DIAGNOSIS — M5430 Sciatica, unspecified side: Secondary | ICD-10-CM

## 2016-03-22 ENCOUNTER — Other Ambulatory Visit (INDEPENDENT_AMBULATORY_CARE_PROVIDER_SITE_OTHER): Payer: Self-pay

## 2016-03-22 DIAGNOSIS — M5442 Lumbago with sciatica, left side: Secondary | ICD-10-CM

## 2016-03-25 ENCOUNTER — Ambulatory Visit (HOSPITAL_BASED_OUTPATIENT_CLINIC_OR_DEPARTMENT_OTHER)
Admission: RE | Admit: 2016-03-25 | Discharge: 2016-03-25 | Disposition: A | Discharge status: Home / Self Care | Payer: BLUE CROSS/BLUE SHIELD | Source: Ambulatory Visit | Attending: Orthopaedic Surgery | Admitting: Orthopaedic Surgery

## 2016-03-25 ENCOUNTER — Ambulatory Visit (INDEPENDENT_AMBULATORY_CARE_PROVIDER_SITE_OTHER): Payer: BLUE CROSS/BLUE SHIELD | Admitting: Orthopaedic Surgery

## 2016-03-25 DIAGNOSIS — M5441 Lumbago with sciatica, right side: Secondary | ICD-10-CM | POA: Diagnosis not present

## 2016-03-25 DIAGNOSIS — Z9889 Other specified postprocedural states: Secondary | ICD-10-CM | POA: Diagnosis not present

## 2016-03-25 DIAGNOSIS — M5442 Lumbago with sciatica, left side: Secondary | ICD-10-CM | POA: Insufficient documentation

## 2016-03-25 NOTE — Addendum Note (Signed)
Addended by: Jacklyn Shell on: 03/25/2016 10:31 AM   Modules accepted: Orders

## 2016-03-25 NOTE — Progress Notes (Signed)
Office Visit Note   Patient: Jamie Baker           Date of Birth: 1970/06/18           MRN: [id number] Visit Date: 03/25/2016              Requested by: Jamie Minium, MD [address] [place], [place] [address] PCP: Jamie Asa, MD   Assessment & Plan: Visit Diagnoses:  1. Acute right-sided low back pain with right-sided sciatica     Plan: At this point given her plain film findings as well as her failure of conservative treatment and MRI is definitely warranted to assess her lumbar spine and to assess for herniated disc. Her hamstrings are quite tight I did show her back extension exercises and eager to try and to do 2-3 times in the morning and 2-3 times in the evening. We will see her back once the MRI is obtained to determine what intervention will be.  Follow-Up Instructions: Return in about 2 years (around 03/25/2018).   Orders:  No orders of the defined types were placed in this encounter.  No orders of the defined types were placed in this encounter.     Procedures: No procedures performed   Clinical Data: No additional findings.   Subjective: Chief Complaint  Patient presents with  . Lower Back - Pain    Patient states she has had sciatica for quite sometime. Was seeing Dr. Birdie Baker, which referred her to [place]. Patients back pain does radiate down her right leg.   Mrs. Jamie Baker reports that she is already been on steroids now twice over the last month and doing with her sciatica. She points to her right side is sparse her backside going down behind her knee and the downside of the leg as source of her pain and discomfort. She even sits leaning to one side of the chair because is bothering her enough. She does have a history of a shunt that was done and the [place]. She is also had tethered cord surgery around the L2 level. She has not had sciatica before. She denies any change in bowel or bladder function. This is been going on for about a month  now and is getting worse.  HPI  Review of Systems She denies any chest pain, shortness of breath, fever, chills, nausea, vomiting  Objective: Vital Signs: There were no vitals taken for this visit.  Physical Exam She is alert and oriented 3. Ortho Exam She has a positive straight leg raise to the right. Her hamstrings aren't really tight. She is subjective decreased sensation in the L4-L5 distribution. She has normal reflexes and normal motor and muscle tone in her right lower chamois. Specialty Comments:  No specialty comments available.  Imaging: Dg Lumbar Spine 2-3 Views  Result Date: 03/25/2016 CLINICAL DATA:  Lumbago for several days with left-sided radicular symptoms EXAM: LUMBAR SPINE - 2-3 VIEW COMPARISON:  June 19, 2006 FINDINGS: Frontal and lateral views were obtained. There are 5 non-rib-bearing lumbar type vertebral bodies. There is postoperative change posteriorly at the level of the L2 spinous process. No acute fracture or spondylolisthesis is evident. There is mild disc space narrowing at L4-5. Other disc spaces appear unremarkable. No erosive change. There is a shunt catheter tip in the left pelvis. IMPRESSION: Postoperative change posteriorly at L2. No fracture or spondylolisthesis. Mild disc space narrowing L4-5. Shunt catheter tip left pelvis. Electronically Signed   By: Jamie Baker  M.D.   On: 03/25/2016 09:42     PMFS History: Patient Active Problem List   Diagnosis Date Noted  . Pneumonia due to organism 07/06/2015  . OSA (obstructive sleep apnea) 07/06/2015  . Chronic headaches 03/20/2015  . Acute bronchitis 03/03/2015  . 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 Allergic rhinitis 01/30/2015  . History of pneumonia 12/28/2014  . 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 Dry mouth 08/15/2014  . Muscle soreness 08/15/2014  . 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 OSA on CPAP 05/16/2014  . Arnold-Chiari malformation,  type I ([place]) 04/05/2014  . Hypersomnia with sleep apnea 04/05/2014  . Pituitary adenoma ([place]) 02/06/2014  . Maxillary sinusitis, acute 02/06/2014  . Left facial swelling 01/20/2014  . Neuralgia 01/20/2014  . Left temporal headache 01/20/2014  . Occipital neuralgia 11/02/2013  . Snoring 11/02/2013  . 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 Temporary cerebral vascular dysfunction 10/26/2013  . Abdominal pain 07/21/2013  . Diarrhea 07/21/2013  . Preop examination 06/28/2013  . POTS (postural orthostatic tachycardia syndrome) 06/21/2013  . Annual physical exam 11/19/2011  . Numbness and tingling 11/19/2011  . Chiari I malformation [facility])    Past Medical History:  Diagnosis Date  . Allergy   . Bronchitis   . C. difficile diarrhea   . Chiari I malformation ([place])   . Chicken pox   . Ehlers-Danlos syndrome   . GERD (gastroesophageal reflux disease)   . Intracranial hypertension   . Mast cell disease   . Migraine   . Other abnormality of brain or central nervous system function study    cranial cervical instability  . POTS (postural orthostatic tachycardia syndrome)   . Raynaud's syndrome   . Scoliosis   . Sleep apnea with use of continuous positive airway pressure (CPAP)   . Spina bifida occulta   . Syncope   . Trigeminal neuralgia     Family History  Problem Relation Age of Onset  . Arthritis Mother   . Breast cancer Mother   . Stroke Mother   . Hyperlipidemia Father   . Hypertension Father   . Diabetes Father   . Brain cancer Father   . Breast cancer Maternal Aunt   . Arthritis Maternal Grandmother   . Breast cancer Maternal Grandmother   . Stroke Maternal Grandfather   . Heart disease Maternal Grandfather   . Colon cancer Neg Hx   . Esophageal cancer Neg Hx   . Rectal cancer Neg Hx   . Stomach cancer Neg Hx     Past Surgical History:  Procedure Laterality Date  . ABDOMINAL HYSTERECTOMY  2011  . ACDF  2014   5,6,7   . ACHILLES  TENDON LENGTHENING  2009  . ACHILLES TENDON REPAIR  2010  . chairi decompasion  2013  . CRANIECTOMY SUBOCCIPITAL W/ CERVICAL LAMINECTOMY / CHIARI     pt has metal in her neck  . LAMINOPLASTY POSTERIOR CERVICAL SPINE  06/2014   C2-C4  . spinal cord surgery    . TONSILLECTOMY AND ADENOIDECTOMY  1975  . TUBAL LIGATION  1989   Social History   Occupational History  . Accountant [employer]   Social History Main Topics  . Smoking status: Never Smoker  . Smokeless tobacco: Never Used  . Alcohol use 0.0 oz/week     Comment: Rarely  . Drug use: No  . Sexual activity: Not on file

## 2016-04-04 ENCOUNTER — Ambulatory Visit (HOSPITAL_BASED_OUTPATIENT_CLINIC_OR_DEPARTMENT_OTHER)
Admission: RE | Admit: 2016-04-04 | Discharge: 2016-04-04 | Disposition: A | Discharge status: Home / Self Care | Payer: BLUE CROSS/BLUE SHIELD | Source: Ambulatory Visit | Attending: Orthopaedic Surgery | Admitting: Orthopaedic Surgery

## 2016-04-04 DIAGNOSIS — M5441 Lumbago with sciatica, right side: Secondary | ICD-10-CM | POA: Diagnosis present

## 2016-04-04 DIAGNOSIS — M5126 Other intervertebral disc displacement, lumbar region: Secondary | ICD-10-CM | POA: Insufficient documentation

## 2016-04-08 ENCOUNTER — Ambulatory Visit (INDEPENDENT_AMBULATORY_CARE_PROVIDER_SITE_OTHER): Payer: BLUE CROSS/BLUE SHIELD | Admitting: Orthopaedic Surgery

## 2016-04-08 DIAGNOSIS — G8929 Other chronic pain: Secondary | ICD-10-CM

## 2016-04-08 DIAGNOSIS — M5441 Lumbago with sciatica, right side: Secondary | ICD-10-CM | POA: Diagnosis not present

## 2016-04-08 NOTE — Progress Notes (Signed)
Office Visit Note   Patient: Jamie Baker           Date of Birth: [birth year]           MRN: [id number] Visit Date: 04/08/2016              Requested by: Jamie Minium, MD [address] [place], [place] [address] PCP: Jamie Asa, MD   Assessment & Plan: Visit Diagnoses:  1. Chronic right-sided low back pain with right-sided sciatica     Plan:  Given her MRI findings I would like to send her to one of the neurosurgeons in [place] for further evaluation and treatment. This may need more than just an epidural steroid injection given that there is extruded disc material. I'm encouraged by the fact that extension exercises are helping and she has no weakness in her right lower extremity and denies numbness and tingling in the foot however this is certainly a significant herniation and I felt comfortable sending her for further evaluation. She understands this fully and does wish for that referral.  Follow-Up Instructions: Return if symptoms worsen or fail to improve.   Orders:  No orders of the defined types were placed in this encounter.  No orders of the defined types were placed in this encounter.     Procedures: No procedures performed   Clinical Data: No additional findings.   Subjective: No chief complaint on file. Jamie Baker is following up after MRI of her lumbar spine. She's had acute onset of right sided low back pain with radicular symptoms going down her foot. She says she has good days and bad days. The back extension exercises of helped but she still does get pain. She denies a numbness in her foot today.  HPI  Review of Systems A negative for bowel or bladder function changes  Objective: Vital Signs: There were no vitals taken for this visit.  Physical Exam  Ortho Exam On exam she does have a positive straight leg raise to the right and there is some decreased sensation in L4 on the right. She's got excellent motor function in her right  lower chamois normal reflexes Specialty Comments:  No specialty comments available.  Imaging: No results found. The MRI was reviewed with her as well as the MRI report and it shows herniated disc to the right at L4-L5 and there is extruded disc material into the foramina with mass effect on the L4 nerve root  PMFS History: Patient Active Problem List   Diagnosis Date Noted  . Pneumonia due to organism 07/06/2015  . OSA (obstructive sleep apnea) 07/06/2015  . Chronic headaches 03/20/2015  . Acute bronchitis 03/03/2015  . 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 Allergic rhinitis 01/30/2015  . History of pneumonia 12/28/2014  . 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 Dry mouth 08/15/2014  . Muscle soreness 08/15/2014  . 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 OSA on CPAP 05/16/2014  . Arnold-Chiari malformation, type I ([place]) 04/05/2014  . Hypersomnia with sleep apnea 04/05/2014  . Pituitary adenoma ([place]) 02/06/2014  . Maxillary sinusitis, acute 02/06/2014  . Left facial swelling 01/20/2014  . Neuralgia 01/20/2014  . Left temporal headache 01/20/2014  . Occipital neuralgia 11/02/2013  . Snoring 11/02/2013  . 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 Temporary cerebral vascular dysfunction 10/26/2013  . Abdominal pain 07/21/2013  . Diarrhea 07/21/2013  . Preop examination 06/28/2013  . POTS (postural orthostatic tachycardia syndrome) 06/21/2013  . Annual  physical exam 11/19/2011  . Numbness and tingling 11/19/2011  . Chiari I malformation [facility])    Past Medical History:  Diagnosis Date  . Allergy   . Bronchitis   . C. difficile diarrhea   . Chiari I malformation ([place])   . Chicken pox   . Ehlers-Danlos syndrome   . GERD (gastroesophageal reflux disease)   . Intracranial hypertension   . Mast cell disease   . Migraine   . Other abnormality of brain or central nervous system function study    cranial cervical  instability  . POTS (postural orthostatic tachycardia syndrome)   . Raynaud's syndrome   . Scoliosis   . Sleep apnea with use of continuous positive airway pressure (CPAP)   . Spina bifida occulta   . Syncope   . Trigeminal neuralgia     Family History  Problem Relation Age of Onset  . Arthritis Mother   . Breast cancer Mother   . Stroke Mother   . Hyperlipidemia Father   . Hypertension Father   . Diabetes Father   . Brain cancer Father   . Breast cancer Maternal Aunt   . Arthritis Maternal Grandmother   . Breast cancer Maternal Grandmother   . Stroke Maternal Grandfather   . Heart disease Maternal Grandfather   . Colon cancer Neg Hx   . Esophageal cancer Neg Hx   . Rectal cancer Neg Hx   . Stomach cancer Neg Hx     Past Surgical History:  Procedure Laterality Date  . ABDOMINAL HYSTERECTOMY  2011  . ACDF  2014   5,6,7   . ACHILLES TENDON LENGTHENING  2009  . ACHILLES TENDON REPAIR  2010  . chairi decompasion  2013  . CRANIECTOMY SUBOCCIPITAL W/ CERVICAL LAMINECTOMY / CHIARI     pt has metal in her neck  . LAMINOPLASTY POSTERIOR CERVICAL SPINE  06/2014   C2-C4  . spinal cord surgery    . TONSILLECTOMY AND ADENOIDECTOMY  1975  . TUBAL LIGATION  1989   Social History   Occupational History  . Accountant [employer]   Social History Main Topics  . Smoking status: Never Smoker  . Smokeless tobacco: Never Used  . Alcohol use 0.0 oz/week     Comment: Rarely  . Drug use: No  . Sexual activity: Not on file

## 2016-04-18 ENCOUNTER — Encounter: Payer: Self-pay | Admitting: Family Medicine

## 2016-04-18 MED ORDER — AMPHETAMINE-DEXTROAMPHET ER 20 MG PO CP24: 20.0000 mg | ORAL_CAPSULE | Freq: Every day | ORAL | 0 refills | Status: DC

## 2016-04-18 NOTE — Telephone Encounter (Signed)
Last OV 11/30/15 adderall last filled 03/14/16 #30 with 0  Low risk, due for UDS

## 2016-04-26 ENCOUNTER — Other Ambulatory Visit: Payer: Self-pay | Admitting: Family Medicine

## 2016-05-16 ENCOUNTER — Encounter: Payer: Self-pay | Admitting: Family Medicine

## 2016-05-16 MED ORDER — AMPHETAMINE-DEXTROAMPHET ER 20 MG PO CP24: 20.0000 mg | ORAL_CAPSULE | Freq: Every day | ORAL | 0 refills | Status: DC

## 2016-05-16 NOTE — Telephone Encounter (Signed)
Last OV 11/30/15 adderall last filled 04/18/16 #30 with 0

## 2016-05-21 ENCOUNTER — Other Ambulatory Visit: Payer: Self-pay | Admitting: Family Medicine

## 2016-05-29 ENCOUNTER — Ambulatory Visit (INDEPENDENT_AMBULATORY_CARE_PROVIDER_SITE_OTHER): Payer: BLUE CROSS/BLUE SHIELD | Admitting: Physician Assistant

## 2016-05-29 ENCOUNTER — Encounter: Payer: Self-pay | Admitting: Physician Assistant

## 2016-05-29 VITALS — BP 122/84 | HR 63 | Temp 97.8°F | Resp 16 | Ht 64.0 in | Wt 148.0 lb

## 2016-05-29 DIAGNOSIS — R251 Tremor, unspecified: Secondary | ICD-10-CM | POA: Diagnosis not present

## 2016-05-29 LAB — COMPREHENSIVE METABOLIC PANEL
ALT: 10 U/L (ref 0–35)
AST: 14 U/L (ref 0–37)
Albumin: 4.3 g/dL (ref 3.5–5.2)
Alkaline Phosphatase: 75 U/L (ref 39–117)
BUN: 9 mg/dL (ref 6–23)
CHLORIDE: 105 meq/L (ref 96–112)
CO2: 29 meq/L (ref 19–32)
Calcium: 9.4 mg/dL (ref 8.4–10.5)
Creatinine, Ser: 0.65 mg/dL (ref 0.40–1.20)
GFR: 103.4 mL/min (ref >60.00)
GLUCOSE: 80 mg/dL (ref 70–99)
Potassium: 4.4 mEq/L (ref 3.5–5.1)
SODIUM: 142 meq/L (ref 135–145)
TOTAL PROTEIN: 6.8 g/dL (ref 6.0–8.3)
Total Bilirubin: 0.4 mg/dL (ref 0.2–1.2)

## 2016-05-29 NOTE — Patient Instructions (Signed)
Please go to the lab for blood work. I will call you with your results.  Please stay well hydrated and continue well-balanced diet. Your neurological exam today is good. No resting tremor or tremor with intent.   I want you to schedule a follow-up with your Neurologist. Follow-up with Neurosurgery tomorrow as scheduled.   If there is any recurrence please let us know.

## 2016-05-29 NOTE — Progress Notes (Signed)
Pre visit review using our clinic review tool, if applicable. No additional management support is needed unless otherwise documented below in the visit note. 

## 2016-05-29 NOTE — Progress Notes (Signed)
Patient presents to clinic today c/o a prolonged episode of tremoring of R hand yesterday lasting about 3 hours. Occurred while.  Denies tremor of L hand. Denies shakiness. Denies lightheadedness, dizziness. Normal diet and good hydration per patient. Denies any recurrence symptoms. Notes some mild weakness of R hand. Endorses occasional numbness of R pinky but nothing of other fingers. Denies pain or injury to R elbow. No alcohol consumption. Denies family or personal history of tremors. Patient has VP shunt.  Has appointment with Neurology next month. Is seeing every 6 months. Has appointment with Neurosurgery tomorrow for back problems.   Past Medical History:  Diagnosis Date  . Allergy   . Bronchitis   . C. difficile diarrhea   . Chiari I malformation ([place])   . Chicken pox   . Ehlers-Danlos syndrome   . GERD (gastroesophageal reflux disease)   . Intracranial hypertension   . Mast cell disease   . Migraine   . Other abnormality of brain or central nervous system function study    cranial cervical instability  . POTS (postural orthostatic tachycardia syndrome)   . Raynaud's syndrome   . Scoliosis   . Sleep apnea with use of continuous positive airway pressure (CPAP)   . Spina bifida occulta   . Syncope   . Trigeminal neuralgia     Current Outpatient Prescriptions on File Prior to Visit  Medication Sig Dispense Refill  . albuterol (PROAIR HFA) 108 (90 Base) MCG/ACT inhaler Inhale 2 puffs into the lungs every 4 (four) hours as needed for wheezing or shortness of breath. 1 Inhaler 6  . amphetamine-dextroamphetamine (ADDERALL XR) 20 MG 24 hr capsule Take 1 capsule (20 mg total) by mouth daily. 30 capsule 0  . cetirizine (ZYRTEC) 10 MG tablet Take 10 mg by mouth daily.    . cloNIDine (CATAPRES) 0.1 MG tablet TAKE 1 TABLET BY MOUTH THREE TIMES DAILY, SPACED 3 HOURS APART 90 tablet 0  . fludrocortisone (FLORINEF) 0.1 MG tablet Take 1 tablet (0.1 mg total) by mouth daily. 60 tablet 5   . fluticasone (FLONASE) 50 MCG/ACT nasal spray SHAKE LIQUID AND USE 2 SPRAYS IN EACH NOSTRIL DAILY 16 g 3  . midodrine (PROAMATINE) 10 MG tablet TAKE 1 TABLET BY MOUTH THREE TIMES DAILY 90 tablet 3  . ondansetron (ZOFRAN) 4 MG tablet Take 4 mg by mouth every 8 (eight) hours as needed for nausea or vomiting.     . ranitidine (ZANTAC) 150 MG tablet Take 150 mg by mouth daily.    . butalbital-acetaminophen-caffeine (FIORICET) 50-325-40 MG per tablet Take by mouth 2 (two) times daily as needed for headache. 1-2 tablets every 4 hours as needed for headaches.     No current facility-administered medications on file prior to visit.     Allergies  Allergen Reactions  . Levaquin [Levofloxacin In D5w]     Ruptured ache lis tendons    . Adhesive [Tape]     unknown  . Gabapentin Other (See Comments)    tremors  . Zoloft [Sertraline Hcl] Other (See Comments)    hallucinations  . Latex Rash    Family History  Problem Relation Age of Onset  . Arthritis Mother   . Breast cancer Mother   . Stroke Mother   . Hyperlipidemia Father   . Hypertension Father   . Diabetes Father   . Brain cancer Father   . Breast cancer Maternal Aunt   . Arthritis Maternal Grandmother   . Breast cancer Maternal Grandmother   .  Stroke Maternal Grandfather   . Heart disease Maternal Grandfather   . Colon cancer Neg Hx   . Esophageal cancer Neg Hx   . Rectal cancer Neg Hx   . Stomach cancer Neg Hx     Social History   Social History  . Marital status: Married    Spouse name: N/A  . Number of children: 3  . Years of education: MBA   Occupational History  . Accountant [employer]   Social History Main Topics  . Smoking status: Never Smoker  . Smokeless tobacco: Never Used  . Alcohol use 0.0 oz/week     Comment: Rarely  . Drug use: No  . Sexual activity: Not Asked   Other Topics Concern  . None   Social History Narrative   Right handed.  Work- Public house manager, Married, 3 kids.     Caffeine 1  glass daily.    Review of Systems - See HPI.  All other ROS are negative.  BP 122/84   Pulse 63   Temp 97.8 F (36.6 C) (Oral)   Resp 16   Ht 5' 4"  (1.626 m)   Wt 148 lb (67.1 kg)   SpO2 99%   BMI 25.40 kg/m   Physical Exam  Constitutional: She is oriented to person, place, and time and well-developed, well-nourished, and in no distress.  HENT:  Head: Normocephalic and atraumatic.  Right Ear: External ear normal.  Left Ear: External ear normal.  Nose: Nose normal.  Mouth/Throat: Oropharynx is clear and moist. No oropharyngeal exudate.  Eyes: Conjunctivae and EOM are normal. Pupils are equal, round, and reactive to light.  Neck: Neck supple. No thyromegaly present.  Cardiovascular: Normal rate, regular rhythm, normal heart sounds and intact distal pulses.   Pulmonary/Chest: Effort normal and breath sounds normal. No respiratory distress. She has no wheezes. She has no rales. She exhibits no tenderness.  Musculoskeletal: Normal range of motion.  Neurological: She is alert and oriented to person, place, and time. She has normal sensation, normal strength, normal reflexes and intact cranial nerves. She displays no weakness and facial symmetry. She has a normal Cerebellar Exam and a normal Heel to [facility]. Gait normal. Gait normal.  No noted resting tremor.  No tremor produced by intent.   Skin: Skin is warm and dry. No rash noted.  Psychiatric: Affect normal.  Vitals reviewed.  Assessment/Plan: 1. Tremor of right hand Solitary episode although prolonged after overuse of RUE. Resolved on its own. Comprehensive neuro exam without abnormal findings. Cannot reproduce tremor. No resting tremor noted. Will check lab assessment today. Patient to FU with Neurologist and Neurosurgeon as directed. If recurrent will need imaging and potential EEG.  - Comp Met (CMET)   Leeanne Rio, PA-C

## 2016-05-30 ENCOUNTER — Other Ambulatory Visit: Payer: Self-pay | Admitting: Neurosurgery

## 2016-05-31 ENCOUNTER — Encounter (HOSPITAL_COMMUNITY): Payer: Self-pay | Admitting: *Deleted

## 2016-05-31 ENCOUNTER — Encounter (HOSPITAL_COMMUNITY): Payer: Self-pay

## 2016-05-31 ENCOUNTER — Encounter (HOSPITAL_COMMUNITY)
Admission: RE | Admit: 2016-05-31 | Discharge: 2016-05-31 | Disposition: A | Discharge status: Home / Self Care | Payer: BLUE CROSS/BLUE SHIELD | Source: Ambulatory Visit | Attending: Neurosurgery | Admitting: Neurosurgery

## 2016-05-31 DIAGNOSIS — Z7951 Long term (current) use of inhaled steroids: Secondary | ICD-10-CM | POA: Diagnosis not present

## 2016-05-31 DIAGNOSIS — I7300 Raynaud's syndrome without gangrene: Secondary | ICD-10-CM | POA: Diagnosis not present

## 2016-05-31 DIAGNOSIS — Q796 Ehlers-Danlos syndrome: Secondary | ICD-10-CM | POA: Diagnosis not present

## 2016-05-31 DIAGNOSIS — Z982 Presence of cerebrospinal fluid drainage device: Secondary | ICD-10-CM | POA: Diagnosis not present

## 2016-05-31 DIAGNOSIS — M5116 Intervertebral disc disorders with radiculopathy, lumbar region: Secondary | ICD-10-CM | POA: Diagnosis present

## 2016-05-31 DIAGNOSIS — Z7982 Long term (current) use of aspirin: Secondary | ICD-10-CM | POA: Diagnosis not present

## 2016-05-31 DIAGNOSIS — Z8673 Personal history of transient ischemic attack (TIA), and cerebral infarction without residual deficits: Secondary | ICD-10-CM | POA: Diagnosis not present

## 2016-05-31 DIAGNOSIS — K219 Gastro-esophageal reflux disease without esophagitis: Secondary | ICD-10-CM | POA: Diagnosis not present

## 2016-05-31 DIAGNOSIS — G4730 Sleep apnea, unspecified: Secondary | ICD-10-CM | POA: Diagnosis not present

## 2016-05-31 DIAGNOSIS — Q059 Spina bifida, unspecified: Secondary | ICD-10-CM | POA: Diagnosis not present

## 2016-05-31 DIAGNOSIS — Z79899 Other long term (current) drug therapy: Secondary | ICD-10-CM | POA: Diagnosis not present

## 2016-05-31 DIAGNOSIS — I739 Peripheral vascular disease, unspecified: Secondary | ICD-10-CM | POA: Diagnosis not present

## 2016-05-31 HISTORY — DX: Bipolar disorder, unspecified: F31.9

## 2016-05-31 HISTORY — DX: Other complications of anesthesia, initial encounter: T88.59XA

## 2016-05-31 HISTORY — DX: Nausea with vomiting, unspecified: R11.2

## 2016-05-31 HISTORY — DX: Pneumonia, unspecified organism: J18.9

## 2016-05-31 HISTORY — DX: Other specified postprocedural states: Z98.890

## 2016-05-31 HISTORY — DX: Adverse effect of unspecified anesthetic, initial encounter: T41.45XA

## 2016-05-31 HISTORY — DX: Unspecified convulsions: R56.9

## 2016-05-31 HISTORY — DX: Cerebral infarction, unspecified: I63.9

## 2016-05-31 LAB — CBC
HCT: 41.7 % (ref 36.0–46.0)
HEMOGLOBIN: 13.6 g/dL (ref 12.0–15.0)
MCH: 28.8 pg (ref 26.0–34.0)
MCHC: 32.6 g/dL (ref 30.0–36.0)
MCV: 88.3 fL (ref 78.0–100.0)
Platelets: 225 10*3/uL (ref 150–400)
RBC: 4.72 MIL/uL (ref 3.87–5.11)
RDW: 13.4 % (ref 11.5–15.5)
WBC: 5.8 10*3/uL (ref 4.0–10.5)

## 2016-05-31 LAB — SURGICAL PCR SCREEN
MRSA, PCR: NEGATIVE
Staphylococcus aureus: NEGATIVE

## 2016-05-31 NOTE — Progress Notes (Addendum)
Jamie Baker denies chest pain or shortness of breath. After Jamie Baker's PAT visit  I reviewed Dr Olin Pia records in [facility], 10/2015.  Jamie Baker was sent to Dr Caryl Comes by her PCP to evaluate chest pain. Dr Caryl Comes does not think chest pain is cardiac related. I called Jamie Baker and she reports that she haas not had any chest pain or shortness of breath in months.  I told Jamie Baker on the phone that she could add, Florinef to medications she could that the morning of surgery, Jamie Baker repeated information.  Jamie Babka reports that she has not had any issues due to POTS peri op.  Jamie Baker has had neuro  surgeries at [facility] ( in [place]), and [facility] in [place] requested records.

## 2016-05-31 NOTE — Progress Notes (Signed)
Anesthesia Chart Review:  Pt is a 46 year old female scheduled for L4-5 microdiscectomy on 06/03/2016 with Kary Kos, MD.   - PCP is Annye Asa, MD - Saw Virl Axe, MD with cardiology for palpitations, CP and SOB 11/16/15. His note indicates CP not likely cardiac and he didn't feel cardiac workup with indicated. He did offer a cardiac monitor to evaluate palpitations but it does not appear pt chose this. He did not make any  changes in her care.   PMH includes:  Stroke (10/2013), POTS, syncope, chiari I malformation, Raynaud's syndrome, Ehlers-Danlos syndrome (s/p stenting of R IJ at [facility] 06/14/15), spina bifida occulta, seizures, intracranial hypertension (s/p VP shunt 11/23/15), cranial cervical instability (s/p cervical fusion), scoliosis, trigeminal neuralgia, bipolar disorder, C difficile, post-op N/V, GERD. Never smoker. BMI 24.   Pt reports she has undergone multiple previous surgeries at outside facilities without complication. There is an anesthesia record in [place] from [facility] 11/23/15 for VP shunt insertion.   Medications include: albuterol, adderall, ASA, clonidine, florinef, midodrine, zantac  EKG 11/16/15: NSR. Low voltage QRS. Nonspecific T wave abnormality. Prolonged QT.   Cardiac event monitor 09/07/12: Unremarkable event monitor. No arrhythmia noted. Symptoms reported, but reported symptoms do not correlate with any arrhythmias.  Echo 10/27/13 (care everywhere): - The study was technically difficult. There is no comparison study available. The left ventricle is normal in size, wall thickness and wall motion with ejection fraction of 60-65%. - The mitral valve is normal in structure with trace regurgitation. - The tricupid valve leaflets are structurally normal with trace regurgitation. - The aortic valve is trileaflet with thin, pliable leaflets that move normally.  Carotid duplex 10/26/13 (care everywhere): No hemodynamically significant stenosis on either side  If  no changes, I anticipate pt can proceed with surgery as scheduled.   Willeen Cass, FNP-BC [facility] Short Stay [facility]/Anesthesiology Phone: [phone] 05/31/2016 2:18 PM

## 2016-05-31 NOTE — Pre-Procedure Instructions (Signed)
    Jamie Baker  05/31/2016     Your procedure is scheduled on Monday, January 15.  Report to [facility] Admitting at 5:30 A.M.               Your surgery or procedure is scheduled for 7:30 am               Call this number if you have problems the morning of [phone]     Remember:  Do not eat food or drink liquids after midnight.  Take these medicines the morning of surgery with A SIP OF WATER :cetirizine (ZYRTEC), cloNIDine (CATAPRES), midodrine (PROAMATINE), ranitidine (ZANTAC).   Do not wear jewelry, make-up or nail polish.  Do not wear lotions, powders, or perfumes, or deodorant.  Do not shave 48 hours prior to surgery.   Do not bring valuables to the hospital.  [facility] is not responsible for any belongings or valuables.  Contacts, dentures or bridgework may not be worn into surgery.  Leave your suitcase in the car.  After surgery it may be brought to your room.  For patients admitted to the hospital, discharge time will be determined by your treatment team.  Patients discharged the day of surgery will not be allowed to drive home.   Name and phone number of your driver:     Special instructions:  Review  [place] - Preparing For Surgery.  Please read over the following fact sheets that you were given. - Preparing For Surgery and Patient Instructions for Mupirocin Application, Coughing and Deep Breathing, Pain Booklet

## 2016-06-02 NOTE — Anesthesia Preprocedure Evaluation (Addendum)
Anesthesia Evaluation  Patient identified by MRN, date of birth, ID band Patient awake    Reviewed: Allergy & Precautions, H&P , NPO status , Patient's Chart, lab work & pertinent test results  History of Anesthesia Complications (+) PONV  Airway Mallampati: II  TM Distance: >3 FB Neck ROM: Full    Dental no notable dental hx. (+) Teeth Intact, Dental Advisory Given   Pulmonary sleep apnea ,    Pulmonary exam normal breath sounds clear to auscultation       Cardiovascular + Peripheral Vascular Disease  negative cardio ROS   Rhythm:Regular Rate:Normal     Neuro/Psych  Headaches, Seizures -, Well Controlled,  Bipolar Disorder TIAnegative psych ROS   GI/Hepatic Neg liver ROS, GERD  Medicated and Controlled,  Endo/Other  negative endocrine ROS  Renal/GU negative Renal ROS  negative genitourinary   Musculoskeletal   Abdominal   Peds  Hematology negative hematology ROS (+)   Anesthesia Other Findings   Reproductive/Obstetrics negative OB ROS                            Anesthesia Physical Anesthesia Plan  ASA: III  Anesthesia Plan: General   Post-op Pain Management:    Induction: Intravenous  Airway Management Planned: Oral ETT  Additional Equipment:   Intra-op Plan:   Post-operative Plan: Extubation in OR  Informed Consent: I have reviewed the patients History and Physical, chart, labs and discussed the procedure including the risks, benefits and alternatives for the proposed anesthesia with the patient or authorized representative who has indicated his/her understanding and acceptance.   Dental advisory given  Plan Discussed with: CRNA  Anesthesia Plan Comments:         Anesthesia Quick Evaluation

## 2016-06-03 ENCOUNTER — Ambulatory Visit (HOSPITAL_COMMUNITY): Payer: BLUE CROSS/BLUE SHIELD | Admitting: Emergency Medicine

## 2016-06-03 ENCOUNTER — Encounter (HOSPITAL_COMMUNITY)
Admission: RE | Disposition: A | Discharge status: Home / Self Care | Payer: Self-pay | Source: Ambulatory Visit | Attending: Neurosurgery

## 2016-06-03 ENCOUNTER — Ambulatory Visit (HOSPITAL_COMMUNITY): Payer: BLUE CROSS/BLUE SHIELD | Admitting: Anesthesiology

## 2016-06-03 ENCOUNTER — Ambulatory Visit (HOSPITAL_COMMUNITY)
Admission: RE | Admit: 2016-06-03 | Discharge: 2016-06-03 | Disposition: A | Discharge status: Home / Self Care | Payer: BLUE CROSS/BLUE SHIELD | Source: Ambulatory Visit | Attending: Neurosurgery | Admitting: Neurosurgery

## 2016-06-03 ENCOUNTER — Ambulatory Visit (HOSPITAL_COMMUNITY): Payer: BLUE CROSS/BLUE SHIELD

## 2016-06-03 ENCOUNTER — Encounter (HOSPITAL_COMMUNITY): Payer: Self-pay | Admitting: Certified Registered Nurse Anesthetist

## 2016-06-03 DIAGNOSIS — Z79899 Other long term (current) drug therapy: Secondary | ICD-10-CM | POA: Insufficient documentation

## 2016-06-03 DIAGNOSIS — I7300 Raynaud's syndrome without gangrene: Secondary | ICD-10-CM | POA: Insufficient documentation

## 2016-06-03 DIAGNOSIS — I739 Peripheral vascular disease, unspecified: Secondary | ICD-10-CM | POA: Insufficient documentation

## 2016-06-03 DIAGNOSIS — Z7951 Long term (current) use of inhaled steroids: Secondary | ICD-10-CM | POA: Insufficient documentation

## 2016-06-03 DIAGNOSIS — Q059 Spina bifida, unspecified: Secondary | ICD-10-CM | POA: Insufficient documentation

## 2016-06-03 DIAGNOSIS — M5116 Intervertebral disc disorders with radiculopathy, lumbar region: Secondary | ICD-10-CM | POA: Diagnosis not present

## 2016-06-03 DIAGNOSIS — Z7982 Long term (current) use of aspirin: Secondary | ICD-10-CM | POA: Insufficient documentation

## 2016-06-03 DIAGNOSIS — G4730 Sleep apnea, unspecified: Secondary | ICD-10-CM | POA: Insufficient documentation

## 2016-06-03 DIAGNOSIS — Q796 Ehlers-Danlos syndrome: Secondary | ICD-10-CM | POA: Insufficient documentation

## 2016-06-03 DIAGNOSIS — Z982 Presence of cerebrospinal fluid drainage device: Secondary | ICD-10-CM | POA: Insufficient documentation

## 2016-06-03 DIAGNOSIS — Z8673 Personal history of transient ischemic attack (TIA), and cerebral infarction without residual deficits: Secondary | ICD-10-CM | POA: Insufficient documentation

## 2016-06-03 DIAGNOSIS — K219 Gastro-esophageal reflux disease without esophagitis: Secondary | ICD-10-CM | POA: Insufficient documentation

## 2016-06-03 DIAGNOSIS — M5126 Other intervertebral disc displacement, lumbar region: Secondary | ICD-10-CM | POA: Diagnosis present

## 2016-06-03 HISTORY — PX: LUMBAR LAMINECTOMY/DECOMPRESSION MICRODISCECTOMY: SHX5026

## 2016-06-03 HISTORY — DX: Neuromuscular dysfunction of bladder, unspecified: N31.9

## 2016-06-03 SURGERY — LUMBAR LAMINECTOMY/DECOMPRESSION MICRODISCECTOMY 1 LEVEL
Anesthesia: General | Site: Back | Laterality: Right

## 2016-06-03 MED ORDER — ONDANSETRON HCL 4 MG/2ML IJ SOLN
INTRAMUSCULAR | Status: AC
  Filled 2016-06-03: qty 2

## 2016-06-03 MED ORDER — ACETAMINOPHEN 10 MG/ML IV SOLN
INTRAVENOUS | Status: DC | PRN
  Administered 2016-06-03: 1000 mg via INTRAVENOUS

## 2016-06-03 MED ORDER — FLUTICASONE PROPIONATE 50 MCG/ACT NA SUSP
1.0000 | Freq: Every day | NASAL | Status: DC
  Filled 2016-06-03: qty 16

## 2016-06-03 MED ORDER — THROMBIN 5000 UNITS EX SOLR
CUTANEOUS | Status: DC | PRN
  Administered 2016-06-03: 10000 [IU] via TOPICAL

## 2016-06-03 MED ORDER — PHENOL 1.4 % MT LIQD: 1.0000 | OROMUCOSAL | Status: DC | PRN

## 2016-06-03 MED ORDER — SUGAMMADEX SODIUM 200 MG/2ML IV SOLN
INTRAVENOUS | Status: DC | PRN
  Administered 2016-06-03: 150 mg via INTRAVENOUS

## 2016-06-03 MED ORDER — MENTHOL 3 MG MT LOZG: 1.0000 | LOZENGE | OROMUCOSAL | Status: DC | PRN

## 2016-06-03 MED ORDER — EPHEDRINE SULFATE 50 MG/ML IJ SOLN
INTRAMUSCULAR | Status: DC | PRN
  Administered 2016-06-03: 10 mg via INTRAVENOUS

## 2016-06-03 MED ORDER — PROPOFOL 10 MG/ML IV BOLUS
INTRAVENOUS | Status: DC | PRN
  Administered 2016-06-03: 100 mg via INTRAVENOUS

## 2016-06-03 MED ORDER — HYDROMORPHONE HCL 1 MG/ML IJ SOLN
INTRAMUSCULAR | Status: DC
Start: 2016-06-03 — End: 2016-06-03
  Filled 2016-06-03: qty 0.5

## 2016-06-03 MED ORDER — PROMETHAZINE HCL 25 MG/ML IJ SOLN
6.2500 mg | Freq: Once | INTRAMUSCULAR | Status: AC
  Administered 2016-06-03: 6.25 mg via INTRAVENOUS

## 2016-06-03 MED ORDER — CYCLOBENZAPRINE HCL 10 MG PO TABS: 10.0000 mg | ORAL_TABLET | Freq: Three times a day (TID) | ORAL | Status: DC | PRN

## 2016-06-03 MED ORDER — ARTIFICIAL TEARS OP OINT
TOPICAL_OINTMENT | OPHTHALMIC | Status: DC | PRN
  Administered 2016-06-03: 1 via OPHTHALMIC

## 2016-06-03 MED ORDER — HYDROMORPHONE HCL 1 MG/ML IJ SOLN
0.2500 mg | INTRAMUSCULAR | Status: DC | PRN
  Administered 2016-06-03 (×2): 0.5 mg via INTRAVENOUS

## 2016-06-03 MED ORDER — CEFAZOLIN SODIUM-DEXTROSE 2-4 GM/100ML-% IV SOLN
2.0000 g | Freq: Three times a day (TID) | INTRAVENOUS | Status: DC
  Administered 2016-06-03: 2 g via INTRAVENOUS
  Filled 2016-06-03: qty 100

## 2016-06-03 MED ORDER — ALUM & MAG HYDROXIDE-SIMETH 200-200-20 MG/5ML PO SUSP: 30.0000 mL | Freq: Four times a day (QID) | ORAL | Status: DC | PRN

## 2016-06-03 MED ORDER — DEXAMETHASONE SODIUM PHOSPHATE 10 MG/ML IJ SOLN
INTRAMUSCULAR | Status: AC
  Filled 2016-06-03: qty 1

## 2016-06-03 MED ORDER — ASPIRIN 325 MG PO TABS: 325.0000 mg | ORAL_TABLET | Freq: Every day | ORAL | Status: DC

## 2016-06-03 MED ORDER — SCOPOLAMINE 1 MG/3DAYS TD PT72
MEDICATED_PATCH | TRANSDERMAL | Status: DC | PRN
  Administered 2016-06-03: 1 via TRANSDERMAL

## 2016-06-03 MED ORDER — FAMOTIDINE 10 MG PO TABS: 10.0000 mg | ORAL_TABLET | Freq: Every day | ORAL | Status: DC

## 2016-06-03 MED ORDER — SODIUM CHLORIDE 0.9 % IV SOLN: 250.0000 mL | INTRAVENOUS | Status: DC

## 2016-06-03 MED ORDER — SODIUM CHLORIDE 0.9% FLUSH: 3.0000 mL | Freq: Two times a day (BID) | INTRAVENOUS | Status: DC

## 2016-06-03 MED ORDER — ACETAMINOPHEN 325 MG PO TABS: 650.0000 mg | ORAL_TABLET | ORAL | Status: DC | PRN

## 2016-06-03 MED ORDER — CYCLOBENZAPRINE HCL 10 MG PO TABS: 10.0000 mg | ORAL_TABLET | Freq: Three times a day (TID) | ORAL | 0 refills | Status: DC | PRN

## 2016-06-03 MED ORDER — HYDROMORPHONE HCL 1 MG/ML IJ SOLN
INTRAMUSCULAR | Status: AC
  Administered 2016-06-03: 0.5 mg via INTRAVENOUS
  Filled 2016-06-03: qty 0.5

## 2016-06-03 MED ORDER — AMPHETAMINE-DEXTROAMPHET ER 10 MG PO CP24: 20.0000 mg | ORAL_CAPSULE | Freq: Every day | ORAL | Status: DC

## 2016-06-03 MED ORDER — SUGAMMADEX SODIUM 200 MG/2ML IV SOLN
INTRAVENOUS | Status: AC
  Filled 2016-06-03: qty 2

## 2016-06-03 MED ORDER — ACETAMINOPHEN 10 MG/ML IV SOLN
INTRAVENOUS | Status: AC
  Filled 2016-06-03: qty 100

## 2016-06-03 MED ORDER — LORATADINE 10 MG PO TABS: 10.0000 mg | ORAL_TABLET | Freq: Every day | ORAL | Status: DC

## 2016-06-03 MED ORDER — HEMOSTATIC AGENTS (NO CHARGE) OPTIME
TOPICAL | Status: DC | PRN
  Administered 2016-06-03: 1 via TOPICAL

## 2016-06-03 MED ORDER — MIDAZOLAM HCL 2 MG/2ML IJ SOLN
INTRAMUSCULAR | Status: AC
  Filled 2016-06-03: qty 2

## 2016-06-03 MED ORDER — CEFAZOLIN SODIUM 1 G IJ SOLR
INTRAMUSCULAR | Status: AC
  Filled 2016-06-03: qty 20

## 2016-06-03 MED ORDER — HYDROMORPHONE HCL 1 MG/ML IJ SOLN: 0.5000 mg | INTRAMUSCULAR | Status: DC | PRN

## 2016-06-03 MED ORDER — BUPIVACAINE HCL (PF) 0.25 % IJ SOLN
INTRAMUSCULAR | Status: DC | PRN
  Administered 2016-06-03: 10 mL

## 2016-06-03 MED ORDER — LIDOCAINE 2% (20 MG/ML) 5 ML SYRINGE
INTRAMUSCULAR | Status: AC
  Filled 2016-06-03: qty 5

## 2016-06-03 MED ORDER — LIDOCAINE-EPINEPHRINE 1 %-1:100000 IJ SOLN
INTRAMUSCULAR | Status: DC | PRN
  Administered 2016-06-03: 10 mL via INTRADERMAL

## 2016-06-03 MED ORDER — PHENYLEPHRINE 40 MCG/ML (10ML) SYRINGE FOR IV PUSH (FOR BLOOD PRESSURE SUPPORT)
PREFILLED_SYRINGE | INTRAVENOUS | Status: AC
  Filled 2016-06-03: qty 10

## 2016-06-03 MED ORDER — ROCURONIUM BROMIDE 100 MG/10ML IV SOLN
INTRAVENOUS | Status: DC | PRN
  Administered 2016-06-03: 50 mg via INTRAVENOUS

## 2016-06-03 MED ORDER — THROMBIN 5000 UNITS EX SOLR
CUTANEOUS | Status: AC
  Filled 2016-06-03: qty 10000

## 2016-06-03 MED ORDER — LACTATED RINGERS IV SOLN
INTRAVENOUS | Status: DC | PRN
  Administered 2016-06-03 (×2): via INTRAVENOUS

## 2016-06-03 MED ORDER — PANTOPRAZOLE SODIUM 40 MG IV SOLR: 40.0000 mg | Freq: Every day | INTRAVENOUS | Status: DC

## 2016-06-03 MED ORDER — MIDAZOLAM HCL 5 MG/5ML IJ SOLN
INTRAMUSCULAR | Status: DC | PRN
  Administered 2016-06-03: 2 mg via INTRAVENOUS

## 2016-06-03 MED ORDER — MIDODRINE HCL 5 MG PO TABS
10.0000 mg | ORAL_TABLET | Freq: Three times a day (TID) | ORAL | Status: DC
  Filled 2016-06-03: qty 2

## 2016-06-03 MED ORDER — CLONIDINE HCL 0.1 MG PO TABS
0.1000 mg | ORAL_TABLET | Freq: Three times a day (TID) | ORAL | Status: DC
Start: 2016-06-03 — End: 2016-06-03

## 2016-06-03 MED ORDER — EPHEDRINE 5 MG/ML INJ
INTRAVENOUS | Status: AC
  Filled 2016-06-03: qty 10

## 2016-06-03 MED ORDER — PROPOFOL 10 MG/ML IV BOLUS
INTRAVENOUS | Status: AC
  Filled 2016-06-03: qty 40

## 2016-06-03 MED ORDER — OXYCODONE-ACETAMINOPHEN 5-325 MG PO TABS
1.0000 | ORAL_TABLET | ORAL | Status: DC | PRN
  Administered 2016-06-03: 1 via ORAL
  Filled 2016-06-03: qty 1

## 2016-06-03 MED ORDER — FENTANYL CITRATE (PF) 100 MCG/2ML IJ SOLN
INTRAMUSCULAR | Status: AC
  Filled 2016-06-03: qty 4

## 2016-06-03 MED ORDER — PHENYLEPHRINE HCL 10 MG/ML IJ SOLN
INTRAMUSCULAR | Status: DC | PRN
  Administered 2016-06-03: 40 ug via INTRAVENOUS
  Administered 2016-06-03: 80 ug via INTRAVENOUS
  Administered 2016-06-03 (×3): 40 ug via INTRAVENOUS
  Administered 2016-06-03: 120 ug via INTRAVENOUS

## 2016-06-03 MED ORDER — PROMETHAZINE HCL 25 MG/ML IJ SOLN
INTRAMUSCULAR | Status: AC
  Filled 2016-06-03: qty 1

## 2016-06-03 MED ORDER — FENTANYL CITRATE (PF) 100 MCG/2ML IJ SOLN
INTRAMUSCULAR | Status: DC | PRN
  Administered 2016-06-03: 50 ug via INTRAVENOUS
  Administered 2016-06-03: 100 ug via INTRAVENOUS

## 2016-06-03 MED ORDER — FLUDROCORTISONE ACETATE 0.1 MG PO TABS: 0.1000 mg | ORAL_TABLET | Freq: Every day | ORAL | Status: DC

## 2016-06-03 MED ORDER — ALBUTEROL SULFATE (2.5 MG/3ML) 0.083% IN NEBU: 2.5000 mg | INHALATION_SOLUTION | RESPIRATORY_TRACT | Status: DC | PRN

## 2016-06-03 MED ORDER — OXYCODONE-ACETAMINOPHEN 5-325 MG PO TABS: 1.0000 | ORAL_TABLET | ORAL | 0 refills | Status: DC | PRN

## 2016-06-03 MED ORDER — SODIUM CHLORIDE 0.9 % IR SOLN
Status: DC | PRN
  Administered 2016-06-03: 08:00:00

## 2016-06-03 MED ORDER — MIDODRINE HCL 5 MG PO TABS
10.0000 mg | ORAL_TABLET | Freq: Three times a day (TID) | ORAL | Status: DC
  Filled 2016-06-03 (×2): qty 2

## 2016-06-03 MED ORDER — ONDANSETRON HCL 4 MG/2ML IJ SOLN
INTRAMUSCULAR | Status: DC | PRN
  Administered 2016-06-03: 4 mg via INTRAVENOUS

## 2016-06-03 MED ORDER — DEXAMETHASONE SODIUM PHOSPHATE 10 MG/ML IJ SOLN
INTRAMUSCULAR | Status: DC | PRN
  Administered 2016-06-03: 10 mg via INTRAVENOUS

## 2016-06-03 MED ORDER — ROCURONIUM BROMIDE 50 MG/5ML IV SOSY
PREFILLED_SYRINGE | INTRAVENOUS | Status: AC
  Filled 2016-06-03: qty 5

## 2016-06-03 MED ORDER — LIDOCAINE-EPINEPHRINE (PF) 2 %-1:200000 IJ SOLN
INTRAMUSCULAR | Status: AC
  Filled 2016-06-03: qty 20

## 2016-06-03 MED ORDER — BUPIVACAINE HCL (PF) 0.25 % IJ SOLN
INTRAMUSCULAR | Status: AC
  Filled 2016-06-03: qty 30

## 2016-06-03 MED ORDER — ACETAMINOPHEN 650 MG RE SUPP: 650.0000 mg | RECTAL | Status: DC | PRN

## 2016-06-03 MED ORDER — SODIUM CHLORIDE 0.9% FLUSH: 3.0000 mL | INTRAVENOUS | Status: DC | PRN

## 2016-06-03 MED ORDER — CEFAZOLIN SODIUM 1 G IJ SOLR
INTRAMUSCULAR | Status: DC | PRN
  Administered 2016-06-03: 2 g via INTRAMUSCULAR

## 2016-06-03 MED ORDER — LIDOCAINE HCL (CARDIAC) 20 MG/ML IV SOLN
INTRAVENOUS | Status: DC | PRN
  Administered 2016-06-03: 60 mg via INTRAVENOUS

## 2016-06-03 MED ORDER — ONDANSETRON HCL 4 MG/2ML IJ SOLN
4.0000 mg | INTRAMUSCULAR | Status: DC | PRN
  Administered 2016-06-03: 4 mg via INTRAVENOUS

## 2016-06-03 SURGICAL SUPPLY — 64 items
ADH SKN CLS APL DERMABOND .7 (GAUZE/BANDAGES/DRESSINGS) ×1
APL SKNCLS STERI-STRIP NONHPOA (GAUZE/BANDAGES/DRESSINGS) ×1
BAG DECANTER FOR FLEXI CONT (MISCELLANEOUS) ×2 IMPLANT
BENZOIN TINCTURE PRP APPL 2/3 (GAUZE/BANDAGES/DRESSINGS) ×2 IMPLANT
BLADE CLIPPER SURG (BLADE) IMPLANT
BLADE SURG 11 STRL SS (BLADE) ×2 IMPLANT
BUR MATCHSTICK NEURO 3.0 LAGG (BURR) ×2 IMPLANT
BUR PRECISION FLUTE 6.0 (BURR) ×2 IMPLANT
CANISTER SUCT 3000ML PPV (MISCELLANEOUS) ×2 IMPLANT
CARTRIDGE OIL MAESTRO DRILL (MISCELLANEOUS) ×1 IMPLANT
DECANTER SPIKE VIAL GLASS SM (MISCELLANEOUS) ×2 IMPLANT
DERMABOND ADVANCED (GAUZE/BANDAGES/DRESSINGS) ×1
DERMABOND ADVANCED .7 DNX12 (GAUZE/BANDAGES/DRESSINGS) ×1 IMPLANT
DIFFUSER DRILL AIR PNEUMATIC (MISCELLANEOUS) ×2 IMPLANT
DRAPE HALF SHEET 40X57 (DRAPES) IMPLANT
DRAPE LAPAROTOMY 100X72X124 (DRAPES) ×2 IMPLANT
DRAPE MICROSCOPE LEICA (MISCELLANEOUS) ×2 IMPLANT
DRAPE POUCH INSTRU U-SHP 10X18 (DRAPES) ×2 IMPLANT
DRAPE SURG 17X23 STRL (DRAPES) ×2 IMPLANT
DRSG TELFA 3X8 NADH (GAUZE/BANDAGES/DRESSINGS) ×2 IMPLANT
DURAPREP 26ML APPLICATOR (WOUND CARE) ×2 IMPLANT
ELECT REM PT RETURN 9FT ADLT (ELECTROSURGICAL) ×2
ELECTRODE REM PT RTRN 9FT ADLT (ELECTROSURGICAL) ×1 IMPLANT
GAUZE SPONGE 4X4 12PLY STRL (GAUZE/BANDAGES/DRESSINGS) ×2 IMPLANT
GAUZE SPONGE 4X4 16PLY XRAY LF (GAUZE/BANDAGES/DRESSINGS) IMPLANT
GLOVE BIO SURGEON STRL SZ8 (GLOVE) ×1 IMPLANT
GLOVE BIOGEL PI IND STRL 7.0 (GLOVE) IMPLANT
GLOVE BIOGEL PI IND STRL 7.5 (GLOVE) IMPLANT
GLOVE BIOGEL PI INDICATOR 7.0 (GLOVE) ×1
GLOVE BIOGEL PI INDICATOR 7.5 (GLOVE) ×1
GLOVE EXAM NITRILE LRG STRL (GLOVE) IMPLANT
GLOVE EXAM NITRILE XL STR (GLOVE) IMPLANT
GLOVE EXAM NITRILE XS STR PU (GLOVE) IMPLANT
GLOVE INDICATOR 8.5 STRL (GLOVE) ×1 IMPLANT
GLOVE SURG SS PI 7.0 STRL IVOR (GLOVE) ×2 IMPLANT
GLOVE SURG SS PI 7.5 STRL IVOR (GLOVE) ×1 IMPLANT
GLOVE SURG SS PI 8.0 STRL IVOR (GLOVE) ×1 IMPLANT
GLOVE SURG SS PI 8.5 STRL IVOR (GLOVE) ×1
GLOVE SURG SS PI 8.5 STRL STRW (GLOVE) IMPLANT
GOWN STRL REUS W/ TWL LRG LVL3 (GOWN DISPOSABLE) ×1 IMPLANT
GOWN STRL REUS W/ TWL XL LVL3 (GOWN DISPOSABLE) ×2 IMPLANT
GOWN STRL REUS W/TWL 2XL LVL3 (GOWN DISPOSABLE) IMPLANT
GOWN STRL REUS W/TWL LRG LVL3 (GOWN DISPOSABLE) ×4
GOWN STRL REUS W/TWL XL LVL3 (GOWN DISPOSABLE) ×4
KIT BASIN OR (CUSTOM PROCEDURE TRAY) ×2 IMPLANT
KIT ROOM TURNOVER OR (KITS) ×2 IMPLANT
NDL SPNL 22GX3.5 QUINCKE BK (NEEDLE) ×1 IMPLANT
NEEDLE HYPO 22GX1.5 SAFETY (NEEDLE) ×2 IMPLANT
NEEDLE SPNL 22GX3.5 QUINCKE BK (NEEDLE) ×2 IMPLANT
NS IRRIG 1000ML POUR BTL (IV SOLUTION) ×2 IMPLANT
OIL CARTRIDGE MAESTRO DRILL (MISCELLANEOUS) ×2
PACK LAMINECTOMY NEURO (CUSTOM PROCEDURE TRAY) ×2 IMPLANT
PAD DRESSING TELFA 3X8 NADH (GAUZE/BANDAGES/DRESSINGS) IMPLANT
RUBBERBAND STERILE (MISCELLANEOUS) ×4 IMPLANT
SPONGE SURGIFOAM ABS GEL SZ50 (HEMOSTASIS) ×2 IMPLANT
STRIP CLOSURE SKIN 1/2X4 (GAUZE/BANDAGES/DRESSINGS) ×2 IMPLANT
SUT VIC AB 0 CT1 18XCR BRD8 (SUTURE) ×1 IMPLANT
SUT VIC AB 0 CT1 8-18 (SUTURE) ×2
SUT VIC AB 2-0 CT1 18 (SUTURE) ×2 IMPLANT
SUT VICRYL 4-0 PS2 18IN ABS (SUTURE) ×2 IMPLANT
TAPE PAPER 1X10 WHT MICROPORE (GAUZE/BANDAGES/DRESSINGS) ×1 IMPLANT
TOWEL OR 17X24 6PK STRL BLUE (TOWEL DISPOSABLE) ×2 IMPLANT
TOWEL OR 17X26 10 PK STRL BLUE (TOWEL DISPOSABLE) ×2 IMPLANT
WATER STERILE IRR 1000ML POUR (IV SOLUTION) ×2 IMPLANT

## 2016-06-03 NOTE — Op Note (Signed)
Preoperative diagnosis: Right L4 radiculopathy from herniated nucleus pulposus L4-5 with superior fragment right  Postoperative diagnosis: Same  Procedure: Lumbar laminectomy microdiscectomy L4-5 on the right with microdissection of the right L4 and L5 nerve root microscopic discectomy  Surgeon: Dominica Severin Quindell Shere  Anesthesia: Gen.  EBL: Minimal  History of present illness: Patient is very pleasant 46 year old female who separate rest worsening back and right leg pain rating down L4 disc division. Workup revealed a free fragment disc herniation superiorly the L4-5 disc space displacing the right L4 nerve against the right L4 pedicle. Due to patient's failure conservative treatment imaging findings and progressive clinical syndrome I recommended limited microscopic discectomy at L4-5 on the right I extensively went over the risks and benefits of the operation the patient as well as perioperative course expectations of outcome and alternatives of surgery and she understood and agreed to proceed forward.  Operative procedure: Patient brought into the or was induced under general anesthesia positioned prone the Wilson frame her back was prepped and draped in routine sterile fashion preoperative x-ray localize the appropriate level so after infiltration of 10 mL lidocaine with epi a midline incision was made and Bovie light car was used to calcification subperiosteal dissections care lamina of L4 and L5 on the right. Virtually the entire lamina of L4 was then drilled down with a high-speed drill after interoperative x-ray confirmed identification appropriate level. Laminotomy was begun with a 3 mm Kerrison punch under microscopic illumination ligament flavum was removed in piecemeal fashion under biting the medial gutter allowed indication of the right L5 pedicle and then I marched superiorly until I could palpate in and visualize the medial aspect of the right L4 pedicle identified the right L4 nerve root and  underneath the right L4 nerve root was a large free fragment disc herniation that I teased away with a nerve hook removed several large fibrous disc from underneath the L4 nerve displacing against the L4 pedicle. Also decompressed the thecal sac marching back laterally inspected the disc space the disc space was felt to be not bulging or causing any significant stenosis or did not cut into the annulus. There was a Cobos irrigated meticulous in space was maintained Gelfoam was opened up the dura the muscle fascia proximal with interrupted Vicryl and skin was closed running 4 subcuticular Dermabond and a sterile dressing was applied patient recovered in stable condition. At the end of case all needle counts and sponge counts were correct.

## 2016-06-03 NOTE — Discharge Summary (Signed)
  Physician Discharge Summary  Patient ID: Jamie Baker MRN: [id number] DOB/AGE: 02/02/1971 46 y.o.  Admit date: 06/03/2016 Discharge date: 06/03/2016  Admission Diagnoses:Herniated nucleus pulposus L4-5 right  Discharge Diagnoses: Same Active Problems:   HNP (herniated nucleus pulposus), lumbar   Discharged Condition: good  Hospital Course: Patient admitted the hospital underwent decompressive laminectomy and discectomy postoperatively patient did very well recovered in the floor on the floor patient was angling and voiding spontaneously tolerating regular diet stable for discharge home.  Consults: Significant Diagnostic Studies: Treatments: L4-5 laminectomy microdiscectomy Discharge Exam: Blood pressure 106/69, pulse 80, temperature 98.4 F (36.9 C), resp. rate 18, SpO2 100 %. Strength out of 5 wound clean dry and intact  Disposition: Home   Allergies as of 06/03/2016      Reactions   Levaquin [levofloxacin In D5w] Other (See Comments)   RUPTURED ACHILLES TENDONS     Gabapentin Other (See Comments)   TREMORS   Adhesive [tape]    UNSPECIFIED REACTION    Latex Rash   Zoloft [sertraline Hcl] Other (See Comments)   HALLUCINATIONS       Medication List    TAKE these medications   albuterol 108 (90 Base) MCG/ACT inhaler Commonly known as:  PROAIR HFA Inhale 2 puffs into the lungs every 4 (four) hours as needed for wheezing or shortness of breath.   amphetamine-dextroamphetamine 20 MG 24 hr capsule Commonly known as:  ADDERALL XR Take 1 capsule (20 mg total) by mouth daily.   aspirin 325 MG tablet Take 325 mg by mouth daily.   cetirizine 10 MG tablet Commonly known as:  ZYRTEC Take 10 mg by mouth daily.   cloNIDine 0.1 MG tablet Commonly known as:  CATAPRES TAKE 1 TABLET BY MOUTH THREE TIMES DAILY, SPACED 3 HOURS APART   cyclobenzaprine 10 MG tablet Commonly known as:  FLEXERIL Take 1 tablet (10 mg total) by mouth 3 (three) times daily as needed for muscle  spasms.   fludrocortisone 0.1 MG tablet Commonly known as:  FLORINEF Take 1 tablet (0.1 mg total) by mouth daily.   fluticasone 50 MCG/ACT nasal spray Commonly known as:  FLONASE SHAKE LIQUID AND USE 2 SPRAYS IN EACH NOSTRIL DAILY   midodrine 10 MG tablet Commonly known as:  PROAMATINE TAKE 1 TABLET BY MOUTH THREE TIMES DAILY   oxyCODONE-acetaminophen 5-325 MG tablet Commonly known as:  PERCOCET/ROXICET Take 1-2 tablets by mouth every 4 (four) hours as needed for moderate pain.   ranitidine 150 MG tablet Commonly known as:  ZANTAC Take 150 mg by mouth daily.      Follow-up Information    Ceciley Buist P, MD Follow up in 2 day(s).   Specialty:  Neurosurgery Contact information: [address] [place] [address] [phone]        Elaina Hoops, MD .   Specialty:  Neurosurgery Contact information: [address] [place] [address] [phone]           Signed: Elaina Hoops 06/03/2016, 2:59 PM

## 2016-06-03 NOTE — Discharge Instructions (Signed)
Letting no bending no twisting no driving   Wound Care Keep incision covered and dry for one week.  If you shower prior to then, cover incision with plastic wrap.  You may remove outer bandage after one week and shower.  Do not put any creams, lotions, or ointments on incision. Leave steri-strips on neck.  They will fall off by themselves. Activity Walk each and every day, increasing distance each day. No lifting greater than 5 lbs.  Avoid bending, arching, or twisting. No driving for 2 weeks; may ride as a passenger locally. If provided with back brace, wear when out of bed.  It is not necessary to wear in bed. Diet Resume your normal diet.  Return to Work Will be discussed at you follow up appointment. Call Your Doctor If Any of These Occur Redness, drainage, or swelling at the wound.  Temperature greater than 101 degrees. Severe pain not relieved by pain medication. Incision starts to come apart. Follow Up Appt Call today for appointment in 1-2 weeks [id number]) or for problems.  If you have any hardware placed in your spine, you will need an x-ray before your appointment.

## 2016-06-03 NOTE — Progress Notes (Signed)
Pt doing well. Pt and husband given D/C instructions with Rx's, verbal understanding was provided. Pt's incision is clean and dry with no sign of infection. Pt's IV was removed prior to D/C. Pt D/C'd home via wheelchair @ 1540 per MD order. Pt is stable @ D/C and has no other needs at this time. Holli Humbles, RN

## 2016-06-03 NOTE — Anesthesia Procedure Notes (Signed)
Procedure Name: Intubation Date/Time: 06/03/2016 7:37 AM Performed by: Candis Shine Pre-anesthesia Checklist: Patient identified, Emergency Drugs available, Suction available and Patient being monitored Patient Re-evaluated:Patient Re-evaluated prior to inductionOxygen Delivery Method: Circle System Utilized Preoxygenation: Pre-oxygenation with 100% oxygen Intubation Type: IV induction Ventilation: Mask ventilation without difficulty and Oral airway inserted - appropriate to patient size Laryngoscope Size: Mac and 3 Grade View: Grade II Tube type: Oral Tube size: 7.0 mm Number of attempts: 1 Airway Equipment and Method: Stylet and Oral airway Placement Confirmation: ETT inserted through vocal cords under direct vision,  positive ETCO2 and breath sounds checked- equal and bilateral Secured at: 22 cm Tube secured with: Tape Dental Injury: Teeth and Oropharynx as per pre-operative assessment

## 2016-06-03 NOTE — H&P (Signed)
Jamie Baker is an 46 y.o. female.   Chief Complaint: Back and right leg pain HPI: Patient is a very pleasant 46 year old female whose had long-standing issues with her neck or low back status post suboccipital Chiari decompressions anterior cervical fusion lumbar laminectomy for release release of tethered cord and currently she's had worsening right leg radicular symptoms seem to follow an L4 nerve root pattern. Workup has revealed a free fragment disc herniation migrating superiorly the L4-5 disc space displacing the right L4 nerve against right L4 pedicle. Due to patient's failed conservative treatment imaging findings and progressive clinical syndrome I recommended laminectomy microdiscectomy at this level. I've extensively gone over the risks and benefits of that operation with her as well as perioperative course expectations of outcome and alternatives of surgery and she understood and agreed to proceed forward.  Past Medical History:  Diagnosis Date  . Allergy   . Bipolar disorder ([place])   . Bronchitis   . C. difficile diarrhea   . Chiari I malformation ([place])   . Chicken pox   . Complication of anesthesia   . Ehlers-Danlos syndrome   . GERD (gastroesophageal reflux disease)   . Intracranial hypertension   . Mast cell disease   . Migraine   . Neurogenic bladder   . Other abnormality of brain or central nervous system function study    cranial cervical instability  . Pneumonia    2015 ish  . PONV (postoperative nausea and vomiting)   . POTS (postural orthostatic tachycardia syndrome)   . Raynaud's syndrome   . Scoliosis   . Seizures ([place])   . Spina bifida occulta   . Stroke ([place])   . Syncope   . Trigeminal neuralgia   . Trigeminal neuralgia     Past Surgical History:  Procedure Laterality Date  . ABDOMINAL HYSTERECTOMY  2011  . ACDF  2014   5,6,7   . ACHILLES TENDON LENGTHENING  2009  . ACHILLES TENDON REPAIR  2010  . ACHILLES TENDON REPAIR Left 01/2008  . chairi  decompasion  2013  . CRANIECTOMY SUBOCCIPITAL W/ CERVICAL LAMINECTOMY / CHIARI     pt has metal in her neck  . Jugualr Stent Right 2017  . LAMINOPLASTY POSTERIOR CERVICAL SPINE  06/2014   C2-C4  . ROBOTIC ASSISTED LAP VAGINAL HYSTERECTOMY    . spinal cord surgery    . TONSILLECTOMY  1975  . TONSILLECTOMY AND ADENOIDECTOMY  1975  . TUBAL LIGATION  1989  . 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 Arthritis Mother   . Breast cancer Mother   . Stroke Mother   . Hyperlipidemia Father   . Hypertension Father   . Diabetes Father   . Brain cancer Father   . Breast cancer Maternal Aunt   . Arthritis Maternal Grandmother   . Breast cancer Maternal Grandmother   . Stroke Maternal Grandfather   . Heart disease Maternal Grandfather   . Colon cancer Neg Hx   . Esophageal cancer Neg Hx   . Rectal cancer Neg Hx   . Stomach cancer Neg Hx    Social History:  reports that she has never smoked. She has never used smokeless tobacco. She reports that she drinks alcohol. She reports that she does not use drugs.  Allergies:  Allergies  Allergen Reactions  . Levaquin [Levofloxacin In D5w] Other (See Comments)    RUPTURED ACHILLES TENDONS    . Gabapentin Other (See Comments)  TREMORS  . Adhesive [Tape]     UNSPECIFIED REACTION   . Latex Rash  . Zoloft [Sertraline Hcl] Other (See Comments)    HALLUCINATIONS     Medications Prior to Admission  Medication Sig Dispense Refill  . amphetamine-dextroamphetamine (ADDERALL XR) 20 MG 24 hr capsule Take 1 capsule (20 mg total) by mouth daily. 30 capsule 0  . aspirin 325 MG tablet Take 325 mg by mouth daily.    . cetirizine (ZYRTEC) 10 MG tablet Take 10 mg by mouth daily.    . cloNIDine (CATAPRES) 0.1 MG tablet TAKE 1 TABLET BY MOUTH THREE TIMES DAILY, SPACED 3 HOURS APART 90 tablet 0  . fludrocortisone (FLORINEF) 0.1 MG tablet Take 1 tablet (0.1 mg total) by mouth daily. 60 tablet 5  . fluticasone  (FLONASE) 50 MCG/ACT nasal spray SHAKE LIQUID AND USE 2 SPRAYS IN EACH NOSTRIL DAILY 16 g 3  . midodrine (PROAMATINE) 10 MG tablet TAKE 1 TABLET BY MOUTH THREE TIMES DAILY 90 tablet 3  . ranitidine (ZANTAC) 150 MG tablet Take 150 mg by mouth daily.    Marland Kitchen albuterol (PROAIR HFA) 108 (90 Base) MCG/ACT inhaler Inhale 2 puffs into the lungs every 4 (four) hours as needed for wheezing or shortness of breath. 1 Inhaler 6    No results found for this or any previous visit (from the past 48 hour(s)). No results found.  Review of Systems  Musculoskeletal: Positive for back pain, joint pain and myalgias.  Neurological: Positive for tingling and sensory change.    Blood pressure 123/64, pulse 65, temperature 97.9 F (36.6 C), resp. rate 18, SpO2 99 %. Physical Exam  Constitutional: She is oriented to person, place, and time. She appears well-developed.  HENT:  Head: Normocephalic.  Eyes: Pupils are equal, round, and reactive to light.  Neck: Normal range of motion.  Respiratory: Effort normal.  GI: Soft. Bowel sounds are normal.  Neurological: She is alert and oriented to person, place, and time. She has normal strength. GCS eye subscore is 4. GCS verbal subscore is 5. GCS motor subscore is 6.  Strength is 5 out of 5 iliopsoas, quads, hip she's, gastrocs, and tibialis, and EHL.  Skin: Skin is warm and dry.     Assessment/Plan 46 year old female presents for right-sided L4-5 laminectomy microdiscectomy  [name] P, MD 06/03/2016, 7:19 AM

## 2016-06-03 NOTE — Anesthesia Postprocedure Evaluation (Signed)
Anesthesia Post Note  Patient: Jamie Baker  Procedure(s) Performed: Procedure(s) (LRB): Microdiscectomy - Lumbar four - lumbar five - right (Right)  Patient location during evaluation: PACU Anesthesia Type: General Level of consciousness: awake and alert Pain management: pain level controlled Vital Signs Assessment: post-procedure vital signs reviewed and stable Respiratory status: spontaneous breathing, nonlabored ventilation, respiratory function stable and patient connected to nasal cannula oxygen Cardiovascular status: blood pressure returned to baseline and stable Postop Assessment: no signs of nausea or vomiting Anesthetic complications: no       Last Vitals:  Vitals:   06/03/16 1045 06/03/16 1106  BP: 125/63 122/65  Pulse: 77 80  Resp: 17 18  Temp: 36.7 C 36.7 C    Last Pain:  Vitals:   06/03/16 1015  TempSrc:   PainSc: 6                  Levette Paulick,W. EDMOND

## 2016-06-03 NOTE — Transfer of Care (Signed)
Immediate Anesthesia Transfer of Care Note  Patient: Jamie Baker  Procedure(s) Performed: Procedure(s) with comments: Microdiscectomy - Lumbar four - lumbar five - right (Right) - Microdiscectomy - Lumbar four - lumbar five - right  Patient Location: PACU  Anesthesia Type:General  Level of Consciousness: awake, alert  and oriented  Airway & Oxygen Therapy: Patient Spontanous Breathing and Patient connected to nasal cannula oxygen  Post-op Assessment: Report given to RN and Post -op Vital signs reviewed and stable  Post vital signs: Reviewed and stable  Last Vitals:  Vitals:   06/03/16 0619 06/03/16 0620  BP: 123/64   Pulse: 65   Resp: 18   Temp:  36.6 C    Last Pain:  Vitals:   06/03/16 0630  TempSrc:   PainSc: 6       Patients Stated Pain Goal: 3 ([id number])  Complications: No apparent anesthesia complications

## 2016-06-04 ENCOUNTER — Encounter (HOSPITAL_COMMUNITY): Payer: Self-pay | Admitting: Neurosurgery

## 2016-06-21 ENCOUNTER — Encounter: Payer: Self-pay | Admitting: Family Medicine

## 2016-06-21 MED ORDER — AMPHETAMINE-DEXTROAMPHET ER 20 MG PO CP24: 20.0000 mg | ORAL_CAPSULE | Freq: Every day | ORAL | 0 refills | Status: DC

## 2016-06-21 NOTE — Telephone Encounter (Signed)
Last OV 05/29/16 (tremors-cody) adderall last filled 05/16/16 #30 with 0

## 2016-07-11 ENCOUNTER — Encounter: Payer: Self-pay | Admitting: *Deleted

## 2016-07-23 ENCOUNTER — Encounter: Payer: Self-pay | Admitting: Family Medicine

## 2016-07-23 MED ORDER — AMPHETAMINE-DEXTROAMPHET ER 20 MG PO CP24: 20.0000 mg | ORAL_CAPSULE | Freq: Every day | ORAL | 0 refills | Status: DC

## 2016-07-23 NOTE — Telephone Encounter (Signed)
Last OV 11/30/15, no upcoming appts adderall last filled 06/21/16 #30 with 0

## 2016-08-21 ENCOUNTER — Other Ambulatory Visit: Payer: Self-pay | Admitting: Family Medicine

## 2016-08-21 ENCOUNTER — Encounter: Payer: Self-pay | Admitting: Family Medicine

## 2016-08-21 MED ORDER — AMPHETAMINE-DEXTROAMPHET ER 20 MG PO CP24
20.0000 mg | ORAL_CAPSULE | Freq: Every day | ORAL | 0 refills | Status: DC
Start: 2016-08-21 — End: 2016-09-20

## 2016-08-21 NOTE — Telephone Encounter (Signed)
[place] Database reviewed. No red flags.  Will allow refill in PCP absence. Printed, signed and placed at front desk for pick up.

## 2016-08-21 NOTE — Telephone Encounter (Signed)
Last OV 11/30/15 adderall last filled 07/23/16 #30 with 0

## 2016-09-20 ENCOUNTER — Encounter: Payer: Self-pay | Admitting: Family Medicine

## 2016-09-20 MED ORDER — AMPHETAMINE-DEXTROAMPHET ER 20 MG PO CP24: 20.0000 mg | ORAL_CAPSULE | Freq: Every day | ORAL | 0 refills | Status: DC

## 2016-09-20 NOTE — Telephone Encounter (Signed)
Last OV 11/30/15 adderall last filled 08/21/16 #30 with 0

## 2016-09-20 NOTE — Telephone Encounter (Signed)
Med filled and pt made aware available at front desk for pick up.

## 2016-10-18 DIAGNOSIS — Z1231 Encounter for screening mammogram for malignant neoplasm of breast: Secondary | ICD-10-CM | POA: Diagnosis not present

## 2016-10-23 ENCOUNTER — Encounter: Payer: Self-pay | Admitting: Family Medicine

## 2016-10-23 MED ORDER — AMPHETAMINE-DEXTROAMPHET ER 20 MG PO CP24: 20.0000 mg | ORAL_CAPSULE | Freq: Every day | ORAL | 0 refills | Status: DC

## 2016-10-23 NOTE — Telephone Encounter (Signed)
Last OV 05/29/16 adderall last filled 09/20/16 #30 with 0

## 2016-10-28 ENCOUNTER — Ambulatory Visit (INDEPENDENT_AMBULATORY_CARE_PROVIDER_SITE_OTHER): Payer: BLUE CROSS/BLUE SHIELD | Admitting: Family Medicine

## 2016-10-28 ENCOUNTER — Encounter: Payer: Self-pay | Admitting: Family Medicine

## 2016-10-28 VITALS — BP 120/70 | HR 75 | Temp 98.6°F | Resp 14 | Ht 66.0 in | Wt 154.0 lb

## 2016-10-28 DIAGNOSIS — B029 Zoster without complications: Secondary | ICD-10-CM | POA: Insufficient documentation

## 2016-10-28 MED ORDER — MUPIROCIN CALCIUM 2 % EX CREA: 1.0000 "application " | TOPICAL_CREAM | Freq: Two times a day (BID) | CUTANEOUS | 0 refills | Status: DC

## 2016-10-28 MED ORDER — VALACYCLOVIR HCL 1 G PO TABS: 1000.0000 mg | ORAL_TABLET | Freq: Three times a day (TID) | ORAL | 0 refills | Status: DC

## 2016-10-28 NOTE — Assessment & Plan Note (Signed)
New.  Pt's area on R thigh is consistent w/ shingles and is not a bug bite.  Start Valtrex even though it is a little late for this.  Reviewed dx and need to avoid small children and pregnant women.  Reviewed supportive care and red flags that should prompt return.  Pt expressed understanding and is in agreement w/ plan.

## 2016-10-28 NOTE — Progress Notes (Signed)
   Subjective:    Patient ID: Jamie Baker, female    DOB: 13-Mar-1971, 46 y.o.   MRN: [id number]  HPI Insect bite- pt reports area appeared on Wednesday or Thursday after working in the yard all week.  R thigh.  Not itchy but painful.  Warm to touch.     Review of Systems For ROS see HPI     Objective:   Physical Exam  Constitutional: She is oriented to person, place, and time. She appears well-developed and well-nourished. No distress.  HENT:  Head: Normocephalic and atraumatic.  Neurological: She is alert and oriented to person, place, and time.  Skin: Skin is warm and dry. Rash (vesicular rash on R thigh consistent w/ shingles) noted. There is erythema.  Psychiatric: She has a normal mood and affect. Her behavior is normal. Thought content normal.  Vitals reviewed.         Assessment & Plan:

## 2016-10-28 NOTE — Patient Instructions (Addendum)
Schedule your complete physical at your convenience START the Valtrex 3x/day x7 days for suspected shingles Apply the Mupirocin twice daily to prevent infection Keep the area covered Call with any questions or concerns Hang in there!!!

## 2016-10-28 NOTE — Progress Notes (Signed)
Pre visit review using our clinic review tool, if applicable. No additional management support is needed unless otherwise documented below in the visit note. 

## 2016-11-15 ENCOUNTER — Other Ambulatory Visit: Payer: Self-pay | Admitting: Family Medicine

## 2016-11-15 NOTE — Telephone Encounter (Signed)
Last OV 10/28/16 Clonidine last filled 08/21/16 #90 with 0  Is she suppose to take this every day? As this prescription has not been filled in almost 3 months

## 2016-11-25 ENCOUNTER — Ambulatory Visit (INDEPENDENT_AMBULATORY_CARE_PROVIDER_SITE_OTHER): Payer: BLUE CROSS/BLUE SHIELD | Admitting: Family Medicine

## 2016-11-25 ENCOUNTER — Encounter: Payer: Self-pay | Admitting: Family Medicine

## 2016-11-25 ENCOUNTER — Other Ambulatory Visit: Payer: Self-pay | Admitting: General Practice

## 2016-11-25 VITALS — BP 120/81 | HR 79 | Temp 98.2°F | Resp 16 | Ht 66.0 in | Wt 153.4 lb

## 2016-11-25 DIAGNOSIS — Z0000 Encounter for general adult medical examination without abnormal findings: Secondary | ICD-10-CM

## 2016-11-25 DIAGNOSIS — E559 Vitamin D deficiency, unspecified: Secondary | ICD-10-CM | POA: Diagnosis not present

## 2016-11-25 DIAGNOSIS — D352 Benign neoplasm of pituitary gland: Secondary | ICD-10-CM

## 2016-11-25 LAB — BASIC METABOLIC PANEL
BUN: 15 mg/dL (ref 6–23)
CO2: 29 mEq/L (ref 19–32)
Calcium: 9.4 mg/dL (ref 8.4–10.5)
Chloride: 103 mEq/L (ref 96–112)
Creatinine, Ser: 0.77 mg/dL (ref 0.40–1.20)
GFR: 84.86 mL/min (ref >60.00)
GLUCOSE: 83 mg/dL (ref 70–99)
POTASSIUM: 3.9 meq/L (ref 3.5–5.1)
SODIUM: 139 meq/L (ref 135–145)

## 2016-11-25 LAB — LIPID PANEL
CHOLESTEROL: 203 mg/dL — AB (ref 0–200)
HDL: 60.2 mg/dL (ref >39.00)
LDL CALC: 127 mg/dL — AB (ref 0–99)
NONHDL: 142.83
Total CHOL/HDL Ratio: 3
Triglycerides: 81 mg/dL (ref 0.0–149.0)
VLDL: 16.2 mg/dL (ref 0.0–40.0)

## 2016-11-25 LAB — HEPATIC FUNCTION PANEL
ALK PHOS: 68 U/L (ref 39–117)
ALT: 10 U/L (ref 0–35)
AST: 12 U/L (ref 0–37)
Albumin: 4.3 g/dL (ref 3.5–5.2)
BILIRUBIN DIRECT: 0.1 mg/dL (ref 0.0–0.3)
TOTAL PROTEIN: 6.5 g/dL (ref 6.0–8.3)
Total Bilirubin: 0.5 mg/dL (ref 0.2–1.2)

## 2016-11-25 LAB — CBC WITH DIFFERENTIAL/PLATELET
BASOS PCT: 0.3 % (ref 0.0–3.0)
Basophils Absolute: 0 10*3/uL (ref 0.0–0.1)
EOS ABS: 0 10*3/uL (ref 0.0–0.7)
EOS PCT: 0.9 % (ref 0.0–5.0)
HEMATOCRIT: 39.1 % (ref 36.0–46.0)
HEMOGLOBIN: 13 g/dL (ref 12.0–15.0)
LYMPHS PCT: 30.7 % (ref 12.0–46.0)
Lymphs Abs: 1.5 10*3/uL (ref 0.7–4.0)
MCHC: 33.1 g/dL (ref 30.0–36.0)
MCV: 89.4 fl (ref 78.0–100.0)
Monocytes Absolute: 0.4 10*3/uL (ref 0.1–1.0)
Monocytes Relative: 8.5 % (ref 3.0–12.0)
Neutro Abs: 2.9 10*3/uL (ref 1.4–7.7)
Neutrophils Relative %: 59.6 % (ref 43.0–77.0)
PLATELETS: 208 10*3/uL (ref 150.0–400.0)
RBC: 4.37 Mil/uL (ref 3.87–5.11)
RDW: 14.2 % (ref 11.5–15.5)
WBC: 4.9 10*3/uL (ref 4.0–10.5)

## 2016-11-25 LAB — TSH: TSH: 1.99 u[IU]/mL (ref 0.35–4.50)

## 2016-11-25 LAB — VITAMIN D 25 HYDROXY (VIT D DEFICIENCY, FRACTURES): VITD: 30.68 ng/mL (ref 30.00–100.00)

## 2016-11-25 MED ORDER — AMPHETAMINE-DEXTROAMPHET ER 20 MG PO CP24: 20.0000 mg | ORAL_CAPSULE | Freq: Every day | ORAL | 0 refills | Status: DC

## 2016-11-25 NOTE — Assessment & Plan Note (Signed)
Check labs.  Replete prn. 

## 2016-11-25 NOTE — Assessment & Plan Note (Signed)
Pt's PE WNL.  UTD on mammo, colonoscopy, immunizations.  Check labs.  Anticipatory guidance provided.  

## 2016-11-25 NOTE — Progress Notes (Signed)
   Subjective:    Patient ID: Jamie Baker, female    DOB: 1970/07/15, 46 y.o.   MRN: [id number]  HPI CPE- UTD on mammo, colonoscopy.  No need for pap due to hysterectomy.  UTD on Tdap.   Review of Systems Patient reports no vision/ hearing changes, adenopathy,fever, weight change,  persistant/recurrent hoarseness , swallowing issues, chest pain, edema, persistant/recurrent cough, hemoptysis, dyspnea (rest/exertional/paroxysmal nocturnal), gastrointestinal bleeding (melena, rectal bleeding), abdominal pain, significant heartburn, bowel changes, GU symptoms (dysuria, hematuria, incontinence), Gyn symptoms (abnormal  bleeding, pain), focal weakness, memory loss, numbness & tingling, skin/hair/nail changes, abnormal bruising or bleeding, anxiety, or depression.   + syncope due to POTs + palpitations    Objective:   Physical Exam General Appearance:    Alert, cooperative, no distress, appears stated age  Head:    Normocephalic, without obvious abnormality, atraumatic  Eyes:    PERRL, conjunctiva/corneas clear, EOM's intact, fundi    benign, both eyes  Ears:    Normal TM's and external ear canals, both ears  Nose:   Nares normal, septum midline, mucosa normal, no drainage    or sinus tenderness  Throat:   Lips, mucosa, and tongue normal; teeth and gums normal  Neck:   Supple, symmetrical, trachea midline, no adenopathy;    Thyroid: no enlargement/tenderness/nodules  Back:     Symmetric, no curvature, ROM normal, no CVA tenderness  Lungs:     Clear to auscultation bilaterally, respirations unlabored  Chest Wall:    No tenderness or deformity   Heart:    Regular rate and rhythm, S1 and S2 normal, no murmur, rub   or gallop  Breast Exam:    Deferred to mammo  Abdomen:     Soft, non-tender, bowel sounds active all four quadrants,    no masses, no organomegaly  Genitalia:    Deferred  Rectal:    Extremities:   Extremities normal, atraumatic, no cyanosis or edema  Pulses:   2+ and symmetric  all extremities  Skin:   Skin color, texture, turgor normal, no rashes or lesions  Lymph nodes:   Cervical, supraclavicular, and axillary nodes normal  Neurologic:   CNII-XII intact, normal strength, sensation and reflexes    throughout          Assessment & Plan:

## 2016-11-25 NOTE — Patient Instructions (Signed)
Follow up in 1 year or as needed We'll notify you of your lab results and make any changes if needed Continue to work on healthy diet and regular exercise- you look great! Call with any questions or concerns Have a great summer!! 

## 2016-11-25 NOTE — Progress Notes (Signed)
Pre visit review using our clinic review tool, if applicable. No additional management support is needed unless otherwise documented below in the visit note. 

## 2016-11-26 ENCOUNTER — Encounter: Payer: Self-pay | Admitting: General Practice

## 2016-12-18 ENCOUNTER — Other Ambulatory Visit: Payer: Self-pay | Admitting: Family Medicine

## 2016-12-24 ENCOUNTER — Other Ambulatory Visit: Payer: Self-pay | Admitting: Emergency Medicine

## 2016-12-24 ENCOUNTER — Encounter: Payer: Self-pay | Admitting: Family Medicine

## 2016-12-24 NOTE — Telephone Encounter (Signed)
Adderall last rx 11/25/16 # 30 Last OV: 11/25/16 CPE CSC: 01/02/15 UDS: 01/02/15 low risk  Please advise

## 2016-12-25 MED ORDER — AMPHETAMINE-DEXTROAMPHET ER 20 MG PO CP24: 20.0000 mg | ORAL_CAPSULE | Freq: Every day | ORAL | 0 refills | Status: DC

## 2016-12-26 NOTE — Telephone Encounter (Signed)
My chart message sent advising rx is ready for pick up.

## 2017-01-23 ENCOUNTER — Encounter: Payer: Self-pay | Admitting: Family Medicine

## 2017-01-23 MED ORDER — AMPHETAMINE-DEXTROAMPHET ER 20 MG PO CP24: 20.0000 mg | ORAL_CAPSULE | Freq: Every day | ORAL | 0 refills | Status: DC

## 2017-01-23 NOTE — Telephone Encounter (Signed)
Last OV 11/25/16 adderall last filled 12/25/16 #30 with 0   CSC, low risk, last UDS 01/02/15

## 2017-01-23 NOTE — Telephone Encounter (Signed)
Pt informed rx is at front desk for pick up.

## 2017-02-13 ENCOUNTER — Other Ambulatory Visit: Payer: Self-pay | Admitting: Family Medicine

## 2017-02-24 ENCOUNTER — Other Ambulatory Visit: Payer: Self-pay | Admitting: Family Medicine

## 2017-02-24 ENCOUNTER — Encounter: Payer: Self-pay | Admitting: Family Medicine

## 2017-02-24 DIAGNOSIS — F988 Other specified behavioral and emotional disorders with onset usually occurring in childhood and adolescence: Secondary | ICD-10-CM

## 2017-02-24 MED ORDER — AMPHETAMINE-DEXTROAMPHET ER 20 MG PO CP24: 20.0000 mg | ORAL_CAPSULE | Freq: Every day | ORAL | 0 refills | Status: DC

## 2017-02-24 NOTE — Telephone Encounter (Signed)
Last OV 11/25/16 (CPE) adderall last filled 01/23/17 #30 with 0  CSC on file, UDS needed

## 2017-03-27 ENCOUNTER — Encounter: Payer: Self-pay | Admitting: Family Medicine

## 2017-03-27 ENCOUNTER — Other Ambulatory Visit: Payer: Self-pay

## 2017-03-27 ENCOUNTER — Ambulatory Visit: Payer: BLUE CROSS/BLUE SHIELD | Admitting: Family Medicine

## 2017-03-27 VITALS — BP 118/78 | HR 71 | Temp 98.1°F | Resp 16 | Ht 66.0 in | Wt 155.0 lb

## 2017-03-27 DIAGNOSIS — F988 Other specified behavioral and emotional disorders with onset usually occurring in childhood and adolescence: Secondary | ICD-10-CM | POA: Diagnosis not present

## 2017-03-27 MED ORDER — AMPHETAMINE-DEXTROAMPHET ER 20 MG PO CP24: 20.0000 mg | ORAL_CAPSULE | Freq: Every day | ORAL | 0 refills | Status: DC

## 2017-03-27 NOTE — Patient Instructions (Signed)
Schedule your complete physical for July Keep up the good work!  You look great!!! CONGRATS on the new grandbaby!!! Call with any questions or concerns Happy Holidays!!!

## 2017-03-27 NOTE — Progress Notes (Signed)
   Subjective:    Patient ID: CASILDA PICKERILL, female    DOB: Jan 10, 1971, 46 y.o.   MRN: [id number]  HPI ADHD- pt reports Adderall is working and effective in controlling attention sxs.  No difficulties w/ insomnia, palpitations, anorexia.  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   Review of Systems For ROS see HPI     Objective:   Physical Exam  Constitutional: She is oriented to person, place, and time. She appears well-developed and well-nourished. No distress.  HENT:  Head: Normocephalic and atraumatic.  Eyes: Conjunctivae and EOM are normal. Pupils are equal, round, and reactive to light.  Neck: Normal range of motion. Neck supple. No thyromegaly present.  Cardiovascular: Normal rate, regular rhythm, normal heart sounds and intact distal pulses.  No murmur heard. Pulmonary/Chest: Effort normal and breath sounds normal. No respiratory distress.  Abdominal: Soft. She exhibits no distension. There is no tenderness.  Musculoskeletal: She exhibits no edema.  Lymphadenopathy:    She has no cervical adenopathy.  Neurological: She is alert and oriented to person, place, and time.  Skin: Skin is warm and dry.  Psychiatric: She has a normal mood and affect. Her behavior is normal.  Vitals reviewed.         Assessment & Plan:

## 2017-03-27 NOTE — Assessment & Plan Note (Signed)
Pt is doing well on current dose of Adderall.  No side effects.  Due for refill- printed and provided.  UDS due- pt agreeable.  Will follow.

## 2017-03-29 LAB — PAIN MGMT, PROFILE 8 W/CONF, U
6 ACETYLMORPHINE: NEGATIVE ng/mL (ref <10)
ALCOHOL METABOLITES: NEGATIVE ng/mL (ref <500)
Amphetamine: 2193 ng/mL — ABNORMAL HIGH (ref <250)
Amphetamines: POSITIVE ng/mL — AB (ref <500)
Benzodiazepines: NEGATIVE ng/mL (ref <100)
Buprenorphine, Urine: NEGATIVE ng/mL (ref <5)
COCAINE METABOLITE: NEGATIVE ng/mL (ref <150)
Creatinine: 17.3 mg/dL — ABNORMAL LOW
MDMA: NEGATIVE ng/mL (ref <500)
METHAMPHETAMINE: NEGATIVE ng/mL (ref <250)
Marijuana Metabolite: NEGATIVE ng/mL (ref <20)
OPIATES: NEGATIVE ng/mL (ref <100)
OXIDANT: NEGATIVE ug/mL (ref <200)
OXYCODONE: NEGATIVE ng/mL (ref <100)
PH: 6.23 (ref 4.5–9.0)
Specific Gravity: 1.004 (ref >=1.0)

## 2017-04-25 ENCOUNTER — Encounter: Payer: Self-pay | Admitting: Family Medicine

## 2017-04-25 MED ORDER — AMPHETAMINE-DEXTROAMPHET ER 20 MG PO CP24: 20.0000 mg | ORAL_CAPSULE | Freq: Every day | ORAL | 0 refills | Status: DC

## 2017-04-25 NOTE — Telephone Encounter (Signed)
Last OV 03/27/17 adderall last filled 03/27/17 #30 with 0

## 2017-04-25 NOTE — Telephone Encounter (Signed)
Disregard last note, PCP was able to send in the prescription directly from her Computer to the pharmacy.

## 2017-05-15 ENCOUNTER — Other Ambulatory Visit: Payer: Self-pay | Admitting: Family Medicine

## 2017-05-27 ENCOUNTER — Encounter: Payer: Self-pay | Admitting: Family Medicine

## 2017-05-27 MED ORDER — AMPHETAMINE-DEXTROAMPHET ER 20 MG PO CP24: 20.0000 mg | ORAL_CAPSULE | Freq: Every day | ORAL | 0 refills | Status: DC

## 2017-05-27 NOTE — Telephone Encounter (Signed)
Medication filled to pharmacy as requested.   

## 2017-05-27 NOTE — Telephone Encounter (Signed)
Last OV 03/27/17 adderall last filled 04/25/17 #30 with 0

## 2017-05-30 ENCOUNTER — Telehealth: Payer: Self-pay | Admitting: General Practice

## 2017-05-30 NOTE — Telephone Encounter (Signed)
Called pt and left a detailed message on Voicemail- cannot process PA for adderall was advised on covermymeds that pt is not valid.   I need to know if pt has a new insurance card? If so I will need a copy of this to complete the prior authorization .

## 2017-06-02 NOTE — Telephone Encounter (Signed)
Called pt again and LMOVM to return call so we can complete PA on adderall.

## 2017-06-03 NOTE — Telephone Encounter (Signed)
Closing encounter until pt returns call.  

## 2017-06-27 ENCOUNTER — Encounter: Payer: Self-pay | Admitting: Family Medicine

## 2017-06-27 MED ORDER — AMPHETAMINE-DEXTROAMPHET ER 20 MG PO CP24: 20.0000 mg | ORAL_CAPSULE | Freq: Every day | ORAL | 0 refills | Status: DC

## 2017-06-27 NOTE — Telephone Encounter (Signed)
Last OV 03/27/17 adderall last filled 05/27/17 #30 with 0

## 2017-07-25 ENCOUNTER — Encounter: Payer: Self-pay | Admitting: Family Medicine

## 2017-07-25 MED ORDER — AMPHETAMINE-DEXTROAMPHET ER 20 MG PO CP24: 20.0000 mg | ORAL_CAPSULE | Freq: Every day | ORAL | 0 refills | Status: DC

## 2017-07-25 NOTE — Telephone Encounter (Signed)
Last OV 03/27/17 adderall last filled 06/27/17 #30 with 0

## 2017-08-26 ENCOUNTER — Encounter: Payer: Self-pay | Admitting: Family Medicine

## 2017-08-26 MED ORDER — AMPHETAMINE-DEXTROAMPHET ER 20 MG PO CP24: 20.0000 mg | ORAL_CAPSULE | Freq: Every day | ORAL | 0 refills | Status: DC

## 2017-08-26 NOTE — Telephone Encounter (Signed)
Last OV 04/16/2017 Last Fill Date 07/25/2017

## 2017-09-24 ENCOUNTER — Encounter: Payer: Self-pay | Admitting: Family Medicine

## 2017-09-24 MED ORDER — AMPHETAMINE-DEXTROAMPHET ER 20 MG PO CP24: 20.0000 mg | ORAL_CAPSULE | Freq: Every day | ORAL | 0 refills | Status: DC

## 2017-09-24 NOTE — Telephone Encounter (Signed)
Last OV 03/27/17 adderall last filled 08/26/17 #30 with 0

## 2017-10-28 ENCOUNTER — Encounter: Payer: Self-pay | Admitting: Family Medicine

## 2017-10-28 MED ORDER — AMPHETAMINE-DEXTROAMPHET ER 20 MG PO CP24: 20.0000 mg | ORAL_CAPSULE | Freq: Every day | ORAL | 0 refills | Status: DC

## 2017-10-28 NOTE — Telephone Encounter (Signed)
Last OV 03/27/17 adderall last filled 09/24/17 #30 with 0

## 2017-10-29 ENCOUNTER — Other Ambulatory Visit: Payer: Self-pay | Admitting: Family Medicine

## 2017-11-10 ENCOUNTER — Encounter: Payer: Self-pay | Admitting: Family Medicine

## 2017-11-10 ENCOUNTER — Ambulatory Visit: Payer: 59 | Admitting: Family Medicine

## 2017-11-10 ENCOUNTER — Other Ambulatory Visit: Payer: Self-pay

## 2017-11-10 VITALS — BP 110/73 | HR 80 | Temp 98.2°F | Resp 16 | Ht 66.0 in | Wt 164.1 lb

## 2017-11-10 DIAGNOSIS — F988 Other specified behavioral and emotional disorders with onset usually occurring in childhood and adolescence: Secondary | ICD-10-CM | POA: Diagnosis not present

## 2017-11-10 DIAGNOSIS — I951 Orthostatic hypotension: Secondary | ICD-10-CM | POA: Diagnosis not present

## 2017-11-10 DIAGNOSIS — D352 Benign neoplasm of pituitary gland: Secondary | ICD-10-CM

## 2017-11-10 DIAGNOSIS — E2740 Unspecified adrenocortical insufficiency: Secondary | ICD-10-CM | POA: Diagnosis not present

## 2017-11-10 DIAGNOSIS — M532X2 Spinal instabilities, cervical region: Secondary | ICD-10-CM | POA: Diagnosis not present

## 2017-11-10 DIAGNOSIS — I498 Other specified cardiac arrhythmias: Secondary | ICD-10-CM

## 2017-11-10 DIAGNOSIS — G90A Postural orthostatic tachycardia syndrome (POTS): Secondary | ICD-10-CM

## 2017-11-10 DIAGNOSIS — R000 Tachycardia, unspecified: Secondary | ICD-10-CM

## 2017-11-10 NOTE — Assessment & Plan Note (Signed)
Chronic problem.  Tolerating Adderall w/o difficulty.  No side effects.  sxs are well controlled.  No med changes at this time.  Will follow.

## 2017-11-10 NOTE — Assessment & Plan Note (Signed)
Asymptomatic on Florinef daily.  No med changes at this time

## 2017-11-10 NOTE — Patient Instructions (Signed)
Schedule your complete physical in 3-4 months We'll call you with your Neurology and Neurosurgery appts Wear your Aspen Collar as often as possible to help w/ neck pain Call with any questions or concerns Have a great summer!!

## 2017-11-10 NOTE — Progress Notes (Signed)
   Subjective:    Patient ID: Jamie Baker, female    DOB: 02-24-1971, 47 y.o.   MRN: [id number]  HPI ADHD- chronic problem, on Adderall XR 20mg  daily.  BP is well controlled today despite use of stimulant.  No palpitations, insomnia, anorexia (pt has gained 9 lbs)  Pt feels sxs are well controlled and she is able to focus at work and complete necessary tasks.  Adrenal insufficiency- chronic problem, on Florinef daily  Pituitary adenoma- pt has both Neurosurgeon and Neurology following her.  This was placed on 'back burner' while pt had shunt and various other appts at [facility].    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  Has not had any problems until recently.  Now having daily head and neck pain.  Some relief w/ Aspen collar after work.  New job does not allow for drive to [place] for appts- needs local neurosurgeon.   Review of Systems For ROS see HPI     Objective:   Physical Exam  Constitutional: She is oriented to person, place, and time. She appears well-developed and well-nourished. No distress.  HENT:  Head: Normocephalic and atraumatic.  Eyes: Pupils are equal, round, and reactive to light. Conjunctivae and EOM are normal.  Neck: Normal range of motion. Neck supple. No thyromegaly present.  Cardiovascular: Normal rate, regular rhythm, normal heart sounds and intact distal pulses.  No murmur heard. Pulmonary/Chest: Effort normal and breath sounds normal. No respiratory distress.  Abdominal: Soft. She exhibits no distension. There is no tenderness.  Musculoskeletal: She exhibits no edema.  Lymphadenopathy:    She has no cervical adenopathy.  Neurological: She is alert and oriented to person, place, and time.  Skin: Skin is warm and dry.  Psychiatric: She has a normal mood and affect. Her behavior is normal.  Vitals reviewed.         Assessment & Plan:

## 2017-11-10 NOTE — Assessment & Plan Note (Signed)
This was found incidentally on imaging years ago.  Was following w/ Neuro and Neurosurgery but has not been seen recently and is willing to re-establish somewhere.  Referral placed.

## 2017-11-10 NOTE — Assessment & Plan Note (Signed)
New to provider, recurrent problem for pt.  She was supposed to have C2 fixation by Dr Koleen Nimrod in [place] but this never happened.  She is again having severe head and neck pain.  Wearing Aspen collar w/ some relief.  Refer to neurosurgery.  Pt expressed understanding and is in agreement w/ plan.

## 2017-11-16 NOTE — Assessment & Plan Note (Signed)
Ongoing issue.  Currently well controlled.  Continue Florinef

## 2017-11-27 ENCOUNTER — Encounter: Payer: Self-pay | Admitting: Family Medicine

## 2017-11-27 MED ORDER — AMPHETAMINE-DEXTROAMPHET ER 20 MG PO CP24: 20.0000 mg | ORAL_CAPSULE | Freq: Every day | ORAL | 0 refills | Status: DC

## 2017-11-27 NOTE — Telephone Encounter (Signed)
Last OV 11/10/17 adderall last filled 10/28/17 #30 with 0

## 2017-12-03 ENCOUNTER — Other Ambulatory Visit (HOSPITAL_BASED_OUTPATIENT_CLINIC_OR_DEPARTMENT_OTHER): Payer: Self-pay | Admitting: Neurosurgery

## 2017-12-03 DIAGNOSIS — Q068 Other specified congenital malformations of spinal cord: Secondary | ICD-10-CM

## 2017-12-03 DIAGNOSIS — G935 Compression of brain: Secondary | ICD-10-CM

## 2017-12-06 ENCOUNTER — Ambulatory Visit (HOSPITAL_BASED_OUTPATIENT_CLINIC_OR_DEPARTMENT_OTHER)
Admission: RE | Admit: 2017-12-06 | Discharge: 2017-12-06 | Disposition: A | Discharge status: Home / Self Care | Payer: 59 | Source: Ambulatory Visit | Attending: Neurosurgery | Admitting: Neurosurgery

## 2017-12-06 DIAGNOSIS — Q068 Other specified congenital malformations of spinal cord: Secondary | ICD-10-CM

## 2017-12-06 DIAGNOSIS — G935 Compression of brain: Secondary | ICD-10-CM | POA: Diagnosis present

## 2017-12-06 DIAGNOSIS — M5127 Other intervertebral disc displacement, lumbosacral region: Secondary | ICD-10-CM | POA: Diagnosis not present

## 2017-12-06 MED ORDER — GADOBENATE DIMEGLUMINE 529 MG/ML IV SOLN
10.0000 mL | Freq: Once | INTRAVENOUS | Status: AC | PRN
  Administered 2017-12-06: 10 mL via INTRAVENOUS

## 2017-12-06 MED ORDER — GADOBENATE DIMEGLUMINE 529 MG/ML IV SOLN: 10.0000 mL | Freq: Once | INTRAVENOUS | Status: DC | PRN

## 2017-12-26 ENCOUNTER — Encounter: Payer: Self-pay | Admitting: Family Medicine

## 2017-12-26 MED ORDER — AMPHETAMINE-DEXTROAMPHET ER 20 MG PO CP24: 20.0000 mg | ORAL_CAPSULE | Freq: Every day | ORAL | 0 refills | Status: DC

## 2017-12-26 NOTE — Telephone Encounter (Signed)
Last OV 11/10/17 adderall last filled 11/27/17 #30 with 0

## 2018-01-18 ENCOUNTER — Other Ambulatory Visit: Payer: Self-pay | Admitting: Family Medicine

## 2018-01-20 ENCOUNTER — Other Ambulatory Visit: Payer: Self-pay | Admitting: Family Medicine

## 2018-01-26 ENCOUNTER — Encounter: Payer: Self-pay | Admitting: Family Medicine

## 2018-01-26 MED ORDER — AMPHETAMINE-DEXTROAMPHET ER 20 MG PO CP24: 20.0000 mg | ORAL_CAPSULE | Freq: Every day | ORAL | 0 refills | Status: DC

## 2018-01-26 NOTE — Telephone Encounter (Signed)
Last OV 10/31/17 adderall last filled 12/26/17 #30 with 0

## 2018-02-20 ENCOUNTER — Other Ambulatory Visit: Payer: Self-pay | Admitting: Family Medicine

## 2018-02-25 ENCOUNTER — Encounter: Payer: Self-pay | Admitting: Family Medicine

## 2018-02-25 MED ORDER — AMPHETAMINE-DEXTROAMPHET ER 20 MG PO CP24: 20.0000 mg | ORAL_CAPSULE | Freq: Every day | ORAL | 0 refills | Status: DC

## 2018-02-25 NOTE — Telephone Encounter (Signed)
Last OV 11/10/17 adderall last filled 01/26/18 #30 with 0

## 2018-03-23 ENCOUNTER — Other Ambulatory Visit: Payer: Self-pay | Admitting: Family Medicine

## 2018-03-27 ENCOUNTER — Encounter: Payer: Self-pay | Admitting: Family Medicine

## 2018-03-27 MED ORDER — AMPHETAMINE-DEXTROAMPHET ER 20 MG PO CP24: 20.0000 mg | ORAL_CAPSULE | Freq: Every day | ORAL | 0 refills | Status: DC

## 2018-03-27 NOTE — Telephone Encounter (Signed)
Last OV 11/10/17 adderall last filled 02/25/18 #30 with 0

## 2018-04-24 ENCOUNTER — Other Ambulatory Visit: Payer: Self-pay | Admitting: Family Medicine

## 2018-04-27 ENCOUNTER — Encounter: Payer: Self-pay | Admitting: Family Medicine

## 2018-04-27 MED ORDER — AMPHETAMINE-DEXTROAMPHET ER 20 MG PO CP24: 20.0000 mg | ORAL_CAPSULE | Freq: Every day | ORAL | 0 refills | Status: DC

## 2018-04-27 NOTE — Telephone Encounter (Signed)
Last OV 11/10/17 adderall last  03/27/18 #30 with 0

## 2018-04-27 NOTE — Addendum Note (Signed)
Addended by: Davis Gourd on: 04/27/2018 08:17 AM   Modules accepted: Orders

## 2018-05-21 ENCOUNTER — Ambulatory Visit (INDEPENDENT_AMBULATORY_CARE_PROVIDER_SITE_OTHER): Payer: BLUE CROSS/BLUE SHIELD | Admitting: Family Medicine

## 2018-05-21 ENCOUNTER — Other Ambulatory Visit: Payer: Self-pay

## 2018-05-21 ENCOUNTER — Other Ambulatory Visit: Payer: Self-pay | Admitting: General Practice

## 2018-05-21 ENCOUNTER — Encounter: Payer: Self-pay | Admitting: Family Medicine

## 2018-05-21 VITALS — BP 110/76 | HR 76 | Temp 98.1°F | Resp 16 | Ht 66.0 in | Wt 158.4 lb

## 2018-05-21 DIAGNOSIS — E559 Vitamin D deficiency, unspecified: Secondary | ICD-10-CM

## 2018-05-21 DIAGNOSIS — Z0000 Encounter for general adult medical examination without abnormal findings: Secondary | ICD-10-CM | POA: Diagnosis not present

## 2018-05-21 DIAGNOSIS — E785 Hyperlipidemia, unspecified: Secondary | ICD-10-CM

## 2018-05-21 LAB — TSH: TSH: 1.91 u[IU]/mL (ref 0.35–4.50)

## 2018-05-21 LAB — CBC WITH DIFFERENTIAL/PLATELET
Basophils Absolute: 0 10*3/uL (ref 0.0–0.1)
Basophils Relative: 0.3 % (ref 0.0–3.0)
Eosinophils Absolute: 0.1 10*3/uL (ref 0.0–0.7)
Eosinophils Relative: 1.7 % (ref 0.0–5.0)
HCT: 42.6 % (ref 36.0–46.0)
Hemoglobin: 14.2 g/dL (ref 12.0–15.0)
Lymphocytes Relative: 14.9 % (ref 12.0–46.0)
Lymphs Abs: 1.1 10*3/uL (ref 0.7–4.0)
MCHC: 33.4 g/dL (ref 30.0–36.0)
MCV: 88.7 fl (ref 78.0–100.0)
MONO ABS: 0.5 10*3/uL (ref 0.1–1.0)
Monocytes Relative: 6.4 % (ref 3.0–12.0)
Neutro Abs: 5.6 10*3/uL (ref 1.4–7.7)
Neutrophils Relative %: 76.7 % (ref 43.0–77.0)
Platelets: 259 10*3/uL (ref 150.0–400.0)
RBC: 4.8 Mil/uL (ref 3.87–5.11)
RDW: 13.6 % (ref 11.5–15.5)
WBC: 7.3 10*3/uL (ref 4.0–10.5)

## 2018-05-21 LAB — HEPATIC FUNCTION PANEL
ALT: 11 U/L (ref 0–35)
AST: 13 U/L (ref 0–37)
Albumin: 4.4 g/dL (ref 3.5–5.2)
Alkaline Phosphatase: 65 U/L (ref 39–117)
BILIRUBIN DIRECT: 0.1 mg/dL (ref 0.0–0.3)
Total Bilirubin: 0.3 mg/dL (ref 0.2–1.2)
Total Protein: 6.7 g/dL (ref 6.0–8.3)

## 2018-05-21 LAB — BASIC METABOLIC PANEL
BUN: 18 mg/dL (ref 6–23)
CHLORIDE: 104 meq/L (ref 96–112)
CO2: 28 mEq/L (ref 19–32)
Calcium: 9.7 mg/dL (ref 8.4–10.5)
Creatinine, Ser: 0.84 mg/dL (ref 0.40–1.20)
GFR: 76.29 mL/min (ref >60.00)
Glucose, Bld: 86 mg/dL (ref 70–99)
Potassium: 4.6 mEq/L (ref 3.5–5.1)
Sodium: 141 mEq/L (ref 135–145)

## 2018-05-21 LAB — LIPID PANEL
CHOL/HDL RATIO: 3
Cholesterol: 206 mg/dL — ABNORMAL HIGH (ref 0–200)
HDL: 64 mg/dL (ref >39.00)
LDL Cholesterol: 132 mg/dL — ABNORMAL HIGH (ref 0–99)
NONHDL: 141.61
TRIGLYCERIDES: 47 mg/dL (ref 0.0–149.0)
VLDL: 9.4 mg/dL (ref 0.0–40.0)

## 2018-05-21 LAB — VITAMIN D 25 HYDROXY (VIT D DEFICIENCY, FRACTURES): VITD: 25.66 ng/mL — AB (ref 30.00–100.00)

## 2018-05-21 MED ORDER — VITAMIN D (ERGOCALCIFEROL) 1.25 MG (50000 UNIT) PO CAPS: 50000.0000 [IU] | ORAL_CAPSULE | ORAL | 0 refills | Status: AC

## 2018-05-21 NOTE — Assessment & Plan Note (Signed)
Pt has hx of this.  Check labs and replete prn. 

## 2018-05-21 NOTE — Patient Instructions (Addendum)
Follow up in 1 year or as needed We'll notify you of your lab results and make any changes if needed Continue to work on healthy diet and regular exercise- you look great! Schedule your mammogram at your convenience Call with any questions or concerns HAPPY BIRTHDAY!!! Happy New Year!!

## 2018-05-21 NOTE — Assessment & Plan Note (Signed)
Pt's PE WNL.  Due for mammo- pt to schedule.  Start colonoscopy next year.  UTD on Tdap.  Declines flu.  Check labs.  Anticipatory guidance provided.

## 2018-05-21 NOTE — Progress Notes (Signed)
   Subjective:    Patient ID: DEVONNE KITCHEN, female    DOB: 04/02/1971, 48 y.o.   MRN: [id number]  HPI CPE- no need for pap due to hysterectomy.  Due for mammo.  UTD on Tdap.  Due for flu- declines.  No concerns today.   Review of Systems Patient reports no vision/ hearing changes, adenopathy,fever, weight change,  persistant/recurrent hoarseness , swallowing issues, chest pain, palpitations, edema, persistant/recurrent cough, hemoptysis, dyspnea (rest/exertional/paroxysmal nocturnal), gastrointestinal bleeding (melena, rectal bleeding), abdominal pain, significant heartburn, bowel changes, GU symptoms (dysuria, hematuria, incontinence), Gyn symptoms (abnormal  bleeding, pain),  syncope, focal weakness, memory loss, numbness & tingling, skin/hair/nail changes, abnormal bruising or bleeding, anxiety, or depression.     Objective:   Physical Exam General Appearance:    Alert, cooperative, no distress, appears stated age  Head:    Normocephalic, without obvious abnormality, atraumatic  Eyes:    PERRL, conjunctiva/corneas clear, EOM's intact, fundi    benign, both eyes  Ears:    Normal TM's and external ear canals, both ears  Nose:   Nares normal, septum midline, mucosa normal, no drainage    or sinus tenderness  Throat:   Lips, mucosa, and tongue normal; teeth and gums normal  Neck:   Supple, symmetrical, trachea midline, no adenopathy;    Thyroid: no enlargement/tenderness/nodules  Back:     Symmetric, no curvature, ROM normal, no CVA tenderness  Lungs:     Clear to auscultation bilaterally, respirations unlabored  Chest Wall:    No tenderness or deformity   Heart:    Regular rate and rhythm, S1 and S2 normal, no murmur, rub   or gallop  Breast Exam:    Deferred to mammo  Abdomen:     Soft, non-tender, bowel sounds active all four quadrants,    no masses, no organomegaly  Genitalia:    Deferred  Rectal:    Extremities:   Extremities normal, atraumatic, no cyanosis or edema  Pulses:    2+ and symmetric all extremities  Skin:   Skin color, texture, turgor normal, no rashes or lesions  Lymph nodes:   Cervical, supraclavicular, and axillary nodes normal  Neurologic:   CNII-XII intact, normal strength, sensation and reflexes    throughout          Assessment & Plan:

## 2018-05-29 ENCOUNTER — Encounter: Payer: Self-pay | Admitting: Family Medicine

## 2018-05-29 MED ORDER — AMPHETAMINE-DEXTROAMPHET ER 20 MG PO CP24: 20.0000 mg | ORAL_CAPSULE | Freq: Every day | ORAL | 0 refills | Status: DC

## 2018-05-29 NOTE — Telephone Encounter (Signed)
Last OV 06/08/18 adderall last filled 04/27/18 #30 with 0

## 2018-06-01 ENCOUNTER — Other Ambulatory Visit: Payer: Self-pay | Admitting: Family Medicine

## 2018-06-16 ENCOUNTER — Encounter: Payer: Self-pay | Admitting: Family Medicine

## 2018-06-29 ENCOUNTER — Encounter: Payer: Self-pay | Admitting: Family Medicine

## 2018-06-30 MED ORDER — AMPHETAMINE-DEXTROAMPHET ER 20 MG PO CP24: 20.0000 mg | ORAL_CAPSULE | Freq: Every day | ORAL | 0 refills | Status: DC

## 2018-07-31 ENCOUNTER — Other Ambulatory Visit: Payer: Self-pay | Admitting: Family Medicine

## 2018-07-31 ENCOUNTER — Encounter: Payer: Self-pay | Admitting: Family Medicine

## 2018-07-31 MED ORDER — AMPHETAMINE-DEXTROAMPHET ER 20 MG PO CP24: 20.0000 mg | ORAL_CAPSULE | Freq: Every day | ORAL | 0 refills | Status: DC

## 2018-07-31 NOTE — Telephone Encounter (Signed)
Last OV 05/21/18 adderall last filled 06/30/18 #30 with 0

## 2018-09-01 ENCOUNTER — Other Ambulatory Visit: Payer: Self-pay | Admitting: *Deleted

## 2018-09-01 ENCOUNTER — Encounter: Payer: Self-pay | Admitting: Family Medicine

## 2018-09-01 MED ORDER — AMPHETAMINE-DEXTROAMPHET ER 20 MG PO CP24: 20.0000 mg | ORAL_CAPSULE | Freq: Every day | ORAL | 0 refills | Status: DC

## 2018-09-01 NOTE — Telephone Encounter (Signed)
Last Filled: 07/31/2018 #30, 0 Last OV: 05/21/2018

## 2018-10-01 ENCOUNTER — Encounter: Payer: Self-pay | Admitting: Family Medicine

## 2018-10-02 ENCOUNTER — Other Ambulatory Visit: Payer: Self-pay | Admitting: Family Medicine

## 2018-10-02 MED ORDER — AMPHETAMINE-DEXTROAMPHET ER 20 MG PO CP24: 20.0000 mg | ORAL_CAPSULE | Freq: Every day | ORAL | 0 refills | Status: DC

## 2018-10-02 NOTE — Telephone Encounter (Signed)
Last OV 05/21/18 adderall last filled 09/01/18 #30 with 0

## 2018-10-26 DIAGNOSIS — Z20828 Contact with and (suspected) exposure to other viral communicable diseases: Secondary | ICD-10-CM | POA: Diagnosis not present

## 2018-11-02 ENCOUNTER — Other Ambulatory Visit: Payer: Self-pay | Admitting: Family Medicine

## 2018-11-02 ENCOUNTER — Encounter: Payer: Self-pay | Admitting: Family Medicine

## 2018-11-02 NOTE — Telephone Encounter (Signed)
Last OV 05/21/18 adderall last filled 10/02/18 #30 with 0

## 2018-11-03 MED ORDER — AMPHETAMINE-DEXTROAMPHET ER 20 MG PO CP24: 20.0000 mg | ORAL_CAPSULE | Freq: Every day | ORAL | 0 refills | Status: DC

## 2018-11-18 DIAGNOSIS — R05 Cough: Secondary | ICD-10-CM | POA: Diagnosis not present

## 2018-11-18 DIAGNOSIS — Z20828 Contact with and (suspected) exposure to other viral communicable diseases: Secondary | ICD-10-CM | POA: Diagnosis not present

## 2018-11-20 ENCOUNTER — Other Ambulatory Visit: Payer: Self-pay | Admitting: Family Medicine

## 2018-12-02 ENCOUNTER — Encounter: Payer: Self-pay | Admitting: Family Medicine

## 2018-12-02 MED ORDER — AMPHETAMINE-DEXTROAMPHET ER 20 MG PO CP24: 20.0000 mg | ORAL_CAPSULE | Freq: Every day | ORAL | 0 refills | Status: DC

## 2018-12-02 NOTE — Telephone Encounter (Signed)
Last OV 05/21/18 adderall last filled 11/03/18 #30 with 0

## 2018-12-23 ENCOUNTER — Encounter: Payer: Self-pay | Admitting: Family Medicine

## 2019-01-01 ENCOUNTER — Encounter: Payer: Self-pay | Admitting: Family Medicine

## 2019-01-01 ENCOUNTER — Other Ambulatory Visit: Payer: Self-pay

## 2019-01-01 MED ORDER — AMPHETAMINE-DEXTROAMPHET ER 20 MG PO CP24: 20.0000 mg | ORAL_CAPSULE | Freq: Every day | ORAL | 0 refills | Status: DC

## 2019-01-01 NOTE — Telephone Encounter (Signed)
Last refill: 7.15.20 #30, 0 Last OV: 1.2.20 dx. CPE

## 2019-01-31 ENCOUNTER — Encounter: Payer: Self-pay | Admitting: Family Medicine

## 2019-02-01 ENCOUNTER — Other Ambulatory Visit: Payer: Self-pay

## 2019-02-01 ENCOUNTER — Other Ambulatory Visit: Payer: Self-pay | Admitting: General Practice

## 2019-02-01 MED ORDER — FLUDROCORTISONE ACETATE 0.1 MG PO TABS: ORAL_TABLET | ORAL | 0 refills | Status: DC

## 2019-02-01 NOTE — Telephone Encounter (Signed)
Last refill: 8.14.20 #30,0 Last OV: 1.02.20 dx. CPE

## 2019-02-02 MED ORDER — AMPHETAMINE-DEXTROAMPHET ER 20 MG PO CP24: 20.0000 mg | ORAL_CAPSULE | Freq: Every day | ORAL | 0 refills | Status: DC

## 2019-02-04 ENCOUNTER — Other Ambulatory Visit: Payer: Self-pay | Admitting: General Practice

## 2019-02-04 MED ORDER — CLONIDINE HCL 0.1 MG PO TABS: ORAL_TABLET | ORAL | 0 refills | Status: DC

## 2019-02-04 MED ORDER — MIDODRINE HCL 10 MG PO TABS: 10.0000 mg | ORAL_TABLET | Freq: Three times a day (TID) | ORAL | 1 refills | Status: DC

## 2019-03-03 ENCOUNTER — Encounter: Payer: Self-pay | Admitting: Family Medicine

## 2019-03-03 ENCOUNTER — Other Ambulatory Visit: Payer: Self-pay

## 2019-03-03 MED ORDER — AMPHETAMINE-DEXTROAMPHET ER 20 MG PO CP24: 20.0000 mg | ORAL_CAPSULE | Freq: Every day | ORAL | 0 refills | Status: DC

## 2019-03-03 NOTE — Telephone Encounter (Signed)
Last refill: 9.15.20 #30, 0 Last OV: 1.2.20 dx. CPE

## 2019-04-02 ENCOUNTER — Encounter: Payer: Self-pay | Admitting: Family Medicine

## 2019-04-02 MED ORDER — AMPHETAMINE-DEXTROAMPHET ER 20 MG PO CP24: 20.0000 mg | ORAL_CAPSULE | Freq: Every day | ORAL | 0 refills | Status: DC

## 2019-04-02 NOTE — Telephone Encounter (Signed)
Last OV 05/21/18 adderall last filled 03/03/19 #30 with 0

## 2019-04-06 ENCOUNTER — Other Ambulatory Visit: Payer: Self-pay | Admitting: General Practice

## 2019-04-06 NOTE — Telephone Encounter (Signed)
Last OV 05/21/18 Florinef last filled 02/01/19 #60 with  0

## 2019-04-07 MED ORDER — FLUDROCORTISONE ACETATE 0.1 MG PO TABS: ORAL_TABLET | ORAL | 0 refills | Status: DC

## 2019-04-12 DIAGNOSIS — Z01419 Encounter for gynecological examination (general) (routine) without abnormal findings: Secondary | ICD-10-CM | POA: Diagnosis not present

## 2019-04-12 DIAGNOSIS — Z1211 Encounter for screening for malignant neoplasm of colon: Secondary | ICD-10-CM | POA: Diagnosis not present

## 2019-04-12 DIAGNOSIS — Z1231 Encounter for screening mammogram for malignant neoplasm of breast: Secondary | ICD-10-CM | POA: Diagnosis not present

## 2019-05-03 ENCOUNTER — Encounter: Payer: Self-pay | Admitting: Family Medicine

## 2019-05-03 MED ORDER — AMPHETAMINE-DEXTROAMPHET ER 20 MG PO CP24: 20.0000 mg | ORAL_CAPSULE | Freq: Every day | ORAL | 0 refills | Status: DC

## 2019-05-03 NOTE — Telephone Encounter (Signed)
Last OV 05/21/18 adderall last filled 04/02/19 #30 with 0

## 2019-05-13 DIAGNOSIS — Z1231 Encounter for screening mammogram for malignant neoplasm of breast: Secondary | ICD-10-CM | POA: Diagnosis not present

## 2019-05-24 ENCOUNTER — Other Ambulatory Visit: Payer: Self-pay | Admitting: *Deleted

## 2019-05-24 NOTE — Telephone Encounter (Signed)
Rx Clonidine 0.1 Last OV on 05/21/2018 Last Refill on 02/04/2019

## 2019-05-25 MED ORDER — CLONIDINE HCL 0.1 MG PO TABS: ORAL_TABLET | ORAL | 0 refills | Status: DC

## 2019-06-04 ENCOUNTER — Telehealth: Payer: Self-pay

## 2019-06-04 ENCOUNTER — Other Ambulatory Visit: Payer: Self-pay

## 2019-06-04 ENCOUNTER — Encounter: Payer: Self-pay | Admitting: Family Medicine

## 2019-06-04 ENCOUNTER — Ambulatory Visit (INDEPENDENT_AMBULATORY_CARE_PROVIDER_SITE_OTHER): Payer: BC Managed Care – PPO | Admitting: Family Medicine

## 2019-06-04 VITALS — BP 130/82 | HR 73 | Temp 98.4°F | Resp 16 | Ht 66.0 in | Wt 169.6 lb

## 2019-06-04 DIAGNOSIS — E559 Vitamin D deficiency, unspecified: Secondary | ICD-10-CM

## 2019-06-04 DIAGNOSIS — E663 Overweight: Secondary | ICD-10-CM | POA: Diagnosis not present

## 2019-06-04 DIAGNOSIS — D352 Benign neoplasm of pituitary gland: Secondary | ICD-10-CM | POA: Diagnosis not present

## 2019-06-04 DIAGNOSIS — E2740 Unspecified adrenocortical insufficiency: Secondary | ICD-10-CM

## 2019-06-04 DIAGNOSIS — Z0000 Encounter for general adult medical examination without abnormal findings: Secondary | ICD-10-CM

## 2019-06-04 DIAGNOSIS — E669 Obesity, unspecified: Secondary | ICD-10-CM | POA: Insufficient documentation

## 2019-06-04 MED ORDER — AMPHETAMINE-DEXTROAMPHET ER 20 MG PO CP24: 20.0000 mg | ORAL_CAPSULE | Freq: Every day | ORAL | 0 refills | Status: DC

## 2019-06-04 NOTE — Patient Instructions (Signed)
Follow up in 1 year or as needed We'll notify you of your lab results and make any changes if needed We'll call you with your Neuro appt Continue to work on healthy diet and regular exercise- you can do it! Call with any questions or concerns Stay Safe!  Stay Healthy!

## 2019-06-04 NOTE — Telephone Encounter (Signed)
What I am refilling?  Can this be sent as an RX request rather than phone note?  Thanks!

## 2019-06-04 NOTE — Telephone Encounter (Signed)
Last refill: 12.14.20 #30,0  Last OV: 1.15.21 dx. CPE

## 2019-06-04 NOTE — Assessment & Plan Note (Signed)
Pt's PE WNL w/ exception of being overweight.  UTD on mammo, colonoscopy, Tdap.  Check labs.  Anticipatory guidance provided.

## 2019-06-04 NOTE — Assessment & Plan Note (Signed)
Pt has hx of this.  Check labs and replete prn. 

## 2019-06-04 NOTE — Assessment & Plan Note (Addendum)
Asymptomatic on Florinef

## 2019-06-04 NOTE — Assessment & Plan Note (Signed)
Pt has gained 10 lbs since last visit.  Stressed need for healthy diet and regular exercise.  Check labs to risk stratify.  Will follow.

## 2019-06-04 NOTE — Progress Notes (Signed)
   Subjective:    Patient ID: Jamie Baker, female    DOB: December 29, 1970, 49 y.o.   MRN: [id number]  HPI CPE- UTD on mammo, colonoscopy, Tdap.  No need for pap due to hysterectomy.  + 10 lb weight gain   Review of Systems Patient reports no vision/ hearing changes, adenopathy,fever,  persistant/recurrent hoarseness , swallowing issues, chest pain, palpitations, edema, persistant/recurrent cough, hemoptysis, dyspnea (rest/exertional/paroxysmal nocturnal), gastrointestinal bleeding (melena, rectal bleeding), abdominal pain, significant heartburn, bowel changes, GU symptoms (dysuria, hematuria, incontinence), Gyn symptoms (abnormal  bleeding, pain),  syncope, focal weakness, memory loss, numbness & tingling, skin/hair/nail changes, abnormal bruising or bleeding, anxiety, or depression.   This visit occurred during the SARS-CoV-2 public health emergency.  Safety protocols were in place, including screening questions prior to the visit, additional usage of staff PPE, and extensive cleaning of exam room while observing appropriate contact time as indicated for disinfecting solutions.       Objective:   Physical Exam General Appearance:    Alert, cooperative, no distress, appears stated age  Head:    Normocephalic, without obvious abnormality, atraumatic  Eyes:    PERRL, conjunctiva/corneas clear, EOM's intact, fundi    benign, both eyes  Ears:    Normal TM's and external ear canals, both ears  Nose:   Deferred   Throat:   Neck:   Supple, symmetrical, trachea midline, no adenopathy;    Thyroid: no enlargement/tenderness/nodules  Back:     Symmetric, no curvature, ROM normal, no CVA tenderness  Lungs:     Clear to auscultation bilaterally, respirations unlabored  Chest Wall:    No tenderness or deformity   Heart:    Regular rate and rhythm, S1 and S2 normal, no murmur, rub   or gallop  Breast Exam:    Deferred to GYN  Abdomen:     Soft, non-tender, bowel sounds active all four quadrants,    no  masses, no organomegaly  Genitalia:    Deferred to GYN  Rectal:    Extremities:   Extremities normal, atraumatic, no cyanosis or edema  Pulses:   2+ and symmetric all extremities  Skin:   Skin color, texture, turgor normal, no rashes or lesions  Lymph nodes:   Cervical, supraclavicular, and axillary nodes normal  Neurologic:   CNII-XII intact, normal strength, sensation and reflexes    throughout          Assessment & Plan:

## 2019-06-05 LAB — BASIC METABOLIC PANEL
BUN: 15 mg/dL (ref 7–25)
CO2: 30 mmol/L (ref 20–32)
Calcium: 9.4 mg/dL (ref 8.6–10.4)
Chloride: 102 mmol/L (ref 98–110)
Creat: 0.7 mg/dL (ref 0.50–1.05)
Glucose, Bld: 75 mg/dL (ref 65–99)
Potassium: 3.8 mmol/L (ref 3.5–5.3)
Sodium: 140 mmol/L (ref 135–146)

## 2019-06-05 LAB — HEPATIC FUNCTION PANEL
AG Ratio: 2 (calc) (ref 1.0–2.5)
ALT: 14 U/L (ref 6–29)
AST: 16 U/L (ref 10–35)
Albumin: 4.3 g/dL (ref 3.6–5.1)
Alkaline phosphatase (APISO): 59 U/L (ref 37–153)
Bilirubin, Direct: 0.1 mg/dL (ref 0.0–0.2)
Globulin: 2.2 g/dL (calc) (ref 1.9–3.7)
Indirect Bilirubin: 0.2 mg/dL (calc) (ref 0.2–1.2)
Total Bilirubin: 0.3 mg/dL (ref 0.2–1.2)
Total Protein: 6.5 g/dL (ref 6.1–8.1)

## 2019-06-05 LAB — CBC WITH DIFFERENTIAL/PLATELET
Absolute Monocytes: 622 cells/uL (ref 200–950)
Basophils Absolute: 11 cells/uL (ref 0–200)
Basophils Relative: 0.2 %
Eosinophils Absolute: 99 cells/uL (ref 15–500)
Eosinophils Relative: 1.8 %
HCT: 40.3 % (ref 35.0–45.0)
Hemoglobin: 13.4 g/dL (ref 11.7–15.5)
Lymphs Abs: 1579 cells/uL (ref 850–3900)
MCH: 28.8 pg (ref 27.0–33.0)
MCHC: 33.3 g/dL (ref 32.0–36.0)
MCV: 86.5 fL (ref 80.0–100.0)
MPV: 10.4 fL (ref 7.5–12.5)
Monocytes Relative: 11.3 %
Neutro Abs: 3190 cells/uL (ref 1500–7800)
Neutrophils Relative %: 58 %
Platelets: 226 10*3/uL (ref 140–400)
RBC: 4.66 10*6/uL (ref 3.80–5.10)
RDW: 12.8 % (ref 11.0–15.0)
Total Lymphocyte: 28.7 %
WBC: 5.5 10*3/uL (ref 3.8–10.8)

## 2019-06-05 LAB — TSH: TSH: 2.18 mIU/L

## 2019-06-05 LAB — LIPID PANEL
Cholesterol: 178 mg/dL (ref <200)
HDL: 60 mg/dL (ref >=50)
LDL Cholesterol (Calc): 102 mg/dL (calc) — ABNORMAL HIGH
Non-HDL Cholesterol (Calc): 118 mg/dL (calc) (ref <130)
Total CHOL/HDL Ratio: 3 (calc) (ref <5.0)
Triglycerides: 71 mg/dL (ref <150)

## 2019-06-05 LAB — VITAMIN D 25 HYDROXY (VIT D DEFICIENCY, FRACTURES): Vit D, 25-Hydroxy: 64 ng/mL (ref 30–100)

## 2019-06-07 ENCOUNTER — Other Ambulatory Visit: Payer: Self-pay | Admitting: General Practice

## 2019-06-07 MED ORDER — FLUDROCORTISONE ACETATE 0.1 MG PO TABS: ORAL_TABLET | ORAL | 0 refills | Status: DC

## 2019-06-10 ENCOUNTER — Other Ambulatory Visit: Payer: Self-pay | Admitting: Family Medicine

## 2019-06-10 DIAGNOSIS — D352 Benign neoplasm of pituitary gland: Secondary | ICD-10-CM

## 2019-06-18 ENCOUNTER — Ambulatory Visit: Payer: BC Managed Care – PPO | Admitting: Internal Medicine

## 2019-06-18 ENCOUNTER — Other Ambulatory Visit: Payer: Self-pay

## 2019-06-18 ENCOUNTER — Encounter: Payer: Self-pay | Admitting: Internal Medicine

## 2019-06-18 VITALS — BP 118/70 | HR 75 | Temp 98.1°F | Ht 66.0 in | Wt 168.0 lb

## 2019-06-18 DIAGNOSIS — Z86018 Personal history of other benign neoplasm: Secondary | ICD-10-CM

## 2019-06-18 NOTE — Progress Notes (Signed)
Name: Jamie Baker  MRN/ DOB: [id number], 1970-11-24    Age/ Sex: 49 y.o., female    PCP: Midge Minium, MD   Reason for Endocrinology Evaluation: Pituitary adenoma     Date of Initial Endocrinology Evaluation: 06/18/2019     HPI: Jamie Baker is a 49 y.o. female with a past medical history of chronic headaches and chiari malformation (S/P craniotomy , and VP shunt 2013), ADHD and POTS.  The patient presented for initial endocrinology clinic visit on 06/18/2019 for consultative assistance with her Pituitary adenoma.   Pt has a hx of pituitary adenoma diagnosis. Used to follow up with neurosurgery and neurology, was diagnosed ~ 20 yrs on MRI. Was brought to her attention approximately 6 yrs ago Was in [place]  No prior treatment for pituitary adenoma.    Has POTS syndrome on Midodrine and florinef   Has chronic headaches that has improved since surgery.  Has chronic fatigue  Has occasional nausea but no change in bolwe movement  Has occasional palpitations  Denies depression and anxiety   Has an MRI that affected her VP shunt per pt.    S/P hysterectomy in 2011 due to endometriosis  No milky discharge from nipples Has noted visual changes, last exam 2 yrs ago     HISTORY:  Past Medical History:  Past Medical History:  Diagnosis Date  . Allergy   . Bipolar disorder ([place])   . Bronchitis   . C. difficile diarrhea   . Chiari I malformation ([place])   . Chicken pox   . Complication of anesthesia   . Ehlers-Danlos syndrome   . GERD (gastroesophageal reflux disease)   . Intracranial hypertension   . Mast cell disease   . Migraine   . Neurogenic bladder   . Other abnormality of brain or central nervous system function study    cranial cervical instability  . Pneumonia    2015 ish  . PONV (postoperative nausea and vomiting)   . POTS (postural orthostatic tachycardia syndrome)   . Raynaud's syndrome   . Scoliosis   . Seizures ([place])   . Spina  bifida occulta   . Stroke ([place])   . Syncope   . Trigeminal neuralgia   . Trigeminal neuralgia    Past Surgical History:  Past Surgical History:  Procedure Laterality Date  . ABDOMINAL HYSTERECTOMY  2011  . ACDF  2014   5,6,7   . ACHILLES TENDON LENGTHENING  2009  . ACHILLES TENDON REPAIR  2010  . ACHILLES TENDON REPAIR Left 01/2008  . chairi decompasion  2013  . CRANIECTOMY SUBOCCIPITAL W/ CERVICAL LAMINECTOMY / CHIARI     pt has metal in her neck  . Jugualr Stent Right 2017  . LAMINOPLASTY POSTERIOR CERVICAL SPINE  06/2014   C2-C4  . LUMBAR LAMINECTOMY/DECOMPRESSION MICRODISCECTOMY Right 06/03/2016   Procedure: Microdiscectomy - Lumbar four - lumbar five - right;  Surgeon: Kary Kos, MD;  Location: [facility];  Service: Neurosurgery;  Laterality: Right;  Microdiscectomy - Lumbar four - lumbar five - right  . ROBOTIC ASSISTED LAP VAGINAL HYSTERECTOMY    . spinal cord surgery    . TONSILLECTOMY  1975  . TONSILLECTOMY AND ADENOIDECTOMY  1975  . TUBAL LIGATION  1989  . 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 She has never used smokeless tobacco. She reports current alcohol use. She reports that she does not use drugs.  Family History: family history includes Arthritis in her maternal grandmother and mother; Brain cancer in her father; Breast cancer in her maternal aunt, maternal grandmother, and mother; Diabetes in her father; Heart disease in her maternal grandfather; Hyperlipidemia in her father; Hypertension in her father; Stroke in her maternal grandfather and mother.   HOME MEDICATIONS: Allergies as of 06/18/2019      Reactions   Levaquin [levofloxacin In D5w] Other (See Comments)   RUPTURED ACHILLES TENDONS     Gabapentin Other (See Comments)   TREMORS   Adhesive [tape]    UNSPECIFIED REACTION    Latex Rash   Zoloft [sertraline Hcl] Other (See Comments)   HALLUCINATIONS       Medication List       Accurate as of  June 18, 2019 11:58 AM. If you have any questions, ask your nurse or doctor.        amphetamine-dextroamphetamine 20 MG 24 hr capsule Commonly known as: ADDERALL XR Take 1 capsule (20 mg total) by mouth daily.   aspirin 325 MG tablet Take 325 mg by mouth daily.   cetirizine 10 MG tablet Commonly known as: ZYRTEC Take 10 mg by mouth daily.   cloNIDine 0.1 MG tablet Commonly known as: CATAPRES TAKE 1 TABLET BY MOUTH THREE TIMES DAILY, SPACED 3 HOURS APART   fludrocortisone 0.1 MG tablet Commonly known as: FLORINEF TAKE 1 TABLET(0.1 MG) BY MOUTH DAILY   fluticasone 50 MCG/ACT nasal spray Commonly known as: FLONASE SHAKE LIQUID AND USE 2 SPRAYS IN EACH NOSTRIL DAILY   midodrine 10 MG tablet Commonly known as: PROAMATINE Take 1 tablet (10 mg total) by mouth 3 (three) times daily.   omeprazole 10 MG capsule Commonly known as: PRILOSEC Take 10 mg by mouth daily.   ProAir HFA 108 (90 Base) MCG/ACT inhaler Generic drug: albuterol INHALE 2 PUFFS BY MOUTH INTO THE LUNGS EVERY 4 HOURS AS NEEDED FOR WHEEZING OR SHORTNESS OF BREATH   ranitidine 150 MG tablet Commonly known as: ZANTAC Take 150 mg by mouth daily.   Vitamin D (Ergocalciferol) 1.25 MG (50000 UNIT) Caps capsule Commonly known as: DRISDOL Take 1 capsule (50,000 Units total) by mouth every 7 (seven) days.         REVIEW OF SYSTEMS: A comprehensive ROS was conducted with the patient and is negative except as per HPI    OBJECTIVE:  VS: BP 118/70   Pulse 75   Temp 98.1 F (36.7 C) (Oral)   Ht 5\' 6"  (1.676 m)   Wt 168 lb (76.2 kg)   SpO2 99%   BMI 27.12 kg/m    Wt Readings from Last 3 Encounters:  06/18/19 168 lb (76.2 kg)  06/04/19 169 lb 9.6 oz (76.9 kg)  05/21/18 158 lb 6 oz (71.8 kg)     EXAM: General: Pt appears well and is in NAD  Eyes: External eye exam normal without stare, lid lag or exophthalmos.  EOM intact.    Neck: General: Supple without adenopathy. Thyroid: Thyroid size normal.   No goiter or nodules appreciated. No thyroid bruit.  Lungs: Clear with good BS bilat with no rales, rhonchi, or wheezes  Heart: Auscultation: RRR.  Abdomen: Normoactive bowel sounds, soft, nontender, without masses or organomegaly palpable  Extremities:  BL LE: No pretibial edema normal ROM and strength.  Skin: Hair: Texture and amount normal with gender appropriate distribution Skin Inspection: No rashes Skin Palpation: Skin temperature, texture, and thickness normal to palpation  Neuro: Cranial nerves: II - XII grossly  intact  Motor: Normal strength throughout DTRs: 2+ and symmetric in UE without delay in relaxation phase  Mental Status: Judgment, insight: Intact Orientation: Oriented to time, place, and person Mood and affect: No depression, anxiety, or agitation     DATA REVIEWED:   Results for Jamie Baker, Jamie Baker (MRN [id number]) as of 06/18/2019 11:59  Ref. Range 06/04/2019 16:13  Sodium Latest Ref Range: 135 - 146 mmol/L 140  Potassium Latest Ref Range: 3.5 - 5.3 mmol/L 3.8  Chloride Latest Ref Range: 98 - 110 mmol/L 102  CO2 Latest Ref Range: 20 - 32 mmol/L 30  Glucose Latest Ref Range: 65 - 99 mg/dL 75  BUN Latest Ref Range: 7 - 25 mg/dL 15  Creatinine Latest Ref Range: 0.50 - 1.05 mg/dL 0.70  Calcium Latest Ref Range: 8.6 - 10.4 mg/dL 9.4  BUN/Creatinine Ratio Latest Ref Range: 6 - 22 (calc) NOT APPLICABLE  AG Ratio Latest Ref Range: 1.0 - 2.5 (calc) 2.0  AST Latest Ref Range: 10 - 35 U/L 16  ALT Latest Ref Range: 6 - 29 U/L 14  Total Protein Latest Ref Range: 6.1 - 8.1 g/dL 6.5  Bilirubin, Direct Latest Ref Range: 0.0 - 0.2 mg/dL 0.1  Indirect Bilirubin Latest Ref Range: 0.2 - 1.2 mg/dL (calc) 0.2  Total Bilirubin Latest Ref Range: 0.2 - 1.2 mg/dL 0.3   Results for Jamie Baker, Jamie Baker (MRN [id number]) as of 06/18/2019 11:59  Ref. Range 06/04/2019 16:13  Glucose Latest Ref Range: 65 - 99 mg/dL 75  TSH Latest Units: mIU/L 2.18   ASSESSMENT/PLAN/RECOMMENDATIONS:   1. Hx of  pituitary adenoma :   - No clinical evidence of hyper or hypopituitarism - CT scan in 2019 does not mention pituitary adenoma, initial diagnosis was> 20 yrs, this could have resolved but will proceed with imaging , pt had issues with MRI imaging in the past due to VP shunt , will proceed with CT scan  - Will also proceed with fasting labs (TSh, FT4, prolactin and FSh, ACTH and cortisol)   2. POTS:   - Used to follow up with cardiology  - She is on Midodrine and florinef for this ( No adrenal insufficiency- as the patient is not on steroid replacement)     F/u in 6 months   Signed electronically by: Mack Guise, MD  [facility] Endocrinology  [facility] [address] [place], [place] [address] Phone: [phone] FAX: [phone]   CC: Midge Minium, [address] [place]  [address] Phone: [phone] Fax: [phone]   Return to Endocrinology clinic as below: Future Appointments  Date Time Provider [place]  06/23/2019  8:00 AM [facility] LAB [facility] None

## 2019-06-18 NOTE — Patient Instructions (Signed)
-   Please schedule an 8 AM lab appointment - will contact you with the results    - We will schedule you for a CT head

## 2019-06-23 ENCOUNTER — Other Ambulatory Visit (INDEPENDENT_AMBULATORY_CARE_PROVIDER_SITE_OTHER): Payer: BC Managed Care – PPO

## 2019-06-23 ENCOUNTER — Other Ambulatory Visit: Payer: Self-pay

## 2019-06-23 DIAGNOSIS — Z86018 Personal history of other benign neoplasm: Secondary | ICD-10-CM

## 2019-06-23 LAB — FOLLICLE STIMULATING HORMONE: FSH: 12.1 m[IU]/mL

## 2019-06-23 LAB — CORTISOL: Cortisol, Plasma: 11.7 ug/dL

## 2019-06-23 LAB — TSH: TSH: 1.7 u[IU]/mL (ref 0.35–4.50)

## 2019-06-23 LAB — T4, FREE: Free T4: 0.8 ng/dL (ref 0.60–1.60)

## 2019-06-25 LAB — PROLACTIN: Prolactin: 12.1 ng/mL

## 2019-06-25 LAB — ACTH: C206 ACTH: 19 pg/mL (ref 6–50)

## 2019-07-05 ENCOUNTER — Other Ambulatory Visit: Payer: Self-pay

## 2019-07-05 ENCOUNTER — Encounter: Payer: Self-pay | Admitting: Family Medicine

## 2019-07-05 MED ORDER — AMPHETAMINE-DEXTROAMPHET ER 20 MG PO CP24: 20.0000 mg | ORAL_CAPSULE | Freq: Every day | ORAL | 0 refills | Status: DC

## 2019-07-05 NOTE — Telephone Encounter (Signed)
Last refill: 1.15.21 #30,0  Last OV: 1.15.21 dx. CPE

## 2019-08-03 ENCOUNTER — Encounter: Payer: Self-pay | Admitting: Family Medicine

## 2019-08-03 NOTE — Telephone Encounter (Signed)
Last OV 06/04/19 Adderall last filled 07/05/19 #30 with 0

## 2019-08-04 MED ORDER — AMPHETAMINE-DEXTROAMPHET ER 20 MG PO CP24: 20.0000 mg | ORAL_CAPSULE | Freq: Every day | ORAL | 0 refills | Status: DC

## 2019-08-09 ENCOUNTER — Other Ambulatory Visit: Payer: Self-pay | Admitting: General Practice

## 2019-08-09 MED ORDER — FLUDROCORTISONE ACETATE 0.1 MG PO TABS: ORAL_TABLET | ORAL | 0 refills | Status: DC

## 2019-08-09 MED ORDER — CLONIDINE HCL 0.1 MG PO TABS: ORAL_TABLET | ORAL | 3 refills | Status: DC

## 2019-08-09 MED ORDER — MIDODRINE HCL 10 MG PO TABS: 10.0000 mg | ORAL_TABLET | Freq: Three times a day (TID) | ORAL | 1 refills | Status: DC

## 2019-08-11 ENCOUNTER — Ambulatory Visit
Admission: RE | Admit: 2019-08-11 | Discharge: 2019-08-11 | Disposition: A | Discharge status: Home / Self Care | Payer: BC Managed Care – PPO | Source: Ambulatory Visit | Attending: Internal Medicine | Admitting: Internal Medicine

## 2019-08-11 DIAGNOSIS — Z86018 Personal history of other benign neoplasm: Secondary | ICD-10-CM

## 2019-08-11 MED ORDER — IOPAMIDOL (ISOVUE-300) INJECTION 61%
75.0000 mL | Freq: Once | INTRAVENOUS | Status: AC | PRN
  Administered 2019-08-11: 75 mL via INTRAVENOUS

## 2019-09-03 ENCOUNTER — Encounter: Payer: Self-pay | Admitting: Family Medicine

## 2019-09-03 MED ORDER — AMPHETAMINE-DEXTROAMPHET ER 20 MG PO CP24: 20.0000 mg | ORAL_CAPSULE | Freq: Every day | ORAL | 0 refills | Status: DC

## 2019-09-03 NOTE — Telephone Encounter (Signed)
Last OV 06/04/19 Adderall last filled 08/04/19 #30 with 0

## 2019-10-13 ENCOUNTER — Encounter: Payer: Self-pay | Admitting: Family Medicine

## 2019-10-13 MED ORDER — AMPHETAMINE-DEXTROAMPHET ER 20 MG PO CP24: 20.0000 mg | ORAL_CAPSULE | Freq: Every day | ORAL | 0 refills | Status: DC

## 2019-10-13 MED ORDER — ALBUTEROL SULFATE HFA 108 (90 BASE) MCG/ACT IN AERS: INHALATION_SPRAY | RESPIRATORY_TRACT | 3 refills | Status: AC

## 2019-10-13 NOTE — Telephone Encounter (Signed)
Last OV 06/04/19 Adderall last filled 09/03/19 #30 with 0

## 2019-11-15 ENCOUNTER — Encounter: Payer: Self-pay | Admitting: Family Medicine

## 2019-11-16 ENCOUNTER — Encounter: Payer: Self-pay | Admitting: Family Medicine

## 2019-11-16 MED ORDER — AMPHETAMINE-DEXTROAMPHET ER 20 MG PO CP24: 20.0000 mg | ORAL_CAPSULE | Freq: Every day | ORAL | 0 refills | Status: DC

## 2019-11-16 NOTE — Telephone Encounter (Signed)
Last OV 06/04/19 adderall last filled 10/13/19 #30 with 0

## 2019-12-16 ENCOUNTER — Other Ambulatory Visit: Payer: Self-pay | Admitting: General Practice

## 2019-12-16 MED ORDER — FLUDROCORTISONE ACETATE 0.1 MG PO TABS: ORAL_TABLET | ORAL | 0 refills | Status: DC

## 2019-12-18 ENCOUNTER — Encounter: Payer: Self-pay | Admitting: Family Medicine

## 2019-12-20 MED ORDER — AMPHETAMINE-DEXTROAMPHET ER 20 MG PO CP24: 20.0000 mg | ORAL_CAPSULE | Freq: Every day | ORAL | 0 refills | Status: DC

## 2019-12-20 NOTE — Telephone Encounter (Signed)
Last OV 06/04/19 Adderall last filled 11/16/19 #30 with 0

## 2020-01-18 MED ORDER — AMPHETAMINE-DEXTROAMPHET ER 20 MG PO CP24: 20.0000 mg | ORAL_CAPSULE | Freq: Every day | ORAL | 0 refills | Status: DC

## 2020-01-18 NOTE — Telephone Encounter (Signed)
Last OV 06/04/19 adderall last filled 12/20/19 #30w with 0

## 2020-01-18 NOTE — Addendum Note (Signed)
Addended by: Davis Gourd on: 01/18/2020 07:55 AM   Modules accepted: Orders

## 2020-02-17 ENCOUNTER — Encounter: Payer: Self-pay | Admitting: Family Medicine

## 2020-02-17 MED ORDER — AMPHETAMINE-DEXTROAMPHET ER 20 MG PO CP24: 20.0000 mg | ORAL_CAPSULE | Freq: Every day | ORAL | 0 refills | Status: DC

## 2020-02-17 NOTE — Telephone Encounter (Signed)
Last OV 06/04/19 adderall last filled 01/18/20 #30 with 0

## 2020-03-20 ENCOUNTER — Encounter: Payer: Self-pay | Admitting: Family Medicine

## 2020-03-20 ENCOUNTER — Other Ambulatory Visit: Payer: Self-pay

## 2020-03-20 ENCOUNTER — Encounter (HOSPITAL_BASED_OUTPATIENT_CLINIC_OR_DEPARTMENT_OTHER): Payer: Self-pay

## 2020-03-20 ENCOUNTER — Emergency Department (HOSPITAL_BASED_OUTPATIENT_CLINIC_OR_DEPARTMENT_OTHER)
Admission: EM | Admit: 2020-03-20 | Discharge: 2020-03-20 | Disposition: A | Discharge status: Home / Self Care | Payer: 59 | Attending: Emergency Medicine | Admitting: Emergency Medicine

## 2020-03-20 DIAGNOSIS — R319 Hematuria, unspecified: Secondary | ICD-10-CM | POA: Diagnosis present

## 2020-03-20 DIAGNOSIS — N3001 Acute cystitis with hematuria: Secondary | ICD-10-CM | POA: Insufficient documentation

## 2020-03-20 LAB — URINALYSIS, MICROSCOPIC (REFLEX)
RBC / HPF: >50 RBC/hpf (ref 0–5)
WBC, UA: >50 WBC/hpf (ref 0–5)

## 2020-03-20 LAB — URINALYSIS, ROUTINE W REFLEX MICROSCOPIC
Bilirubin Urine: NEGATIVE
Glucose, UA: NEGATIVE mg/dL
Ketones, ur: NEGATIVE mg/dL
Nitrite: NEGATIVE
Protein, ur: 100 mg/dL — AB
Specific Gravity, Urine: 1.025 (ref 1.005–1.030)
pH: 5.5 (ref 5.0–8.0)

## 2020-03-20 LAB — CBC
HCT: 40.6 % (ref 36.0–46.0)
Hemoglobin: 13.3 g/dL (ref 12.0–15.0)
MCH: 29.2 pg (ref 26.0–34.0)
MCHC: 32.8 g/dL (ref 30.0–36.0)
MCV: 89.2 fL (ref 80.0–100.0)
Platelets: 233 10*3/uL (ref 150–400)
RBC: 4.55 MIL/uL (ref 3.87–5.11)
RDW: 13.2 % (ref 11.5–15.5)
WBC: 12.2 10*3/uL — ABNORMAL HIGH (ref 4.0–10.5)
nRBC: 0 % (ref 0.0–0.2)

## 2020-03-20 LAB — BASIC METABOLIC PANEL
Anion gap: 10 (ref 5–15)
BUN: 18 mg/dL (ref 6–20)
CO2: 27 mmol/L (ref 22–32)
Calcium: 8.9 mg/dL (ref 8.9–10.3)
Chloride: 103 mmol/L (ref 98–111)
Creatinine, Ser: 0.9 mg/dL (ref 0.44–1.00)
GFR, Estimated: >60 mL/min (ref >60)
Glucose, Bld: 94 mg/dL (ref 70–99)
Potassium: 3.4 mmol/L — ABNORMAL LOW (ref 3.5–5.1)
Sodium: 140 mmol/L (ref 135–145)

## 2020-03-20 MED ORDER — CEPHALEXIN 500 MG PO CAPS: 500.0000 mg | ORAL_CAPSULE | Freq: Two times a day (BID) | ORAL | 0 refills | Status: AC

## 2020-03-20 MED ORDER — PHENAZOPYRIDINE HCL 200 MG PO TABS: 200.0000 mg | ORAL_TABLET | Freq: Three times a day (TID) | ORAL | 0 refills | Status: DC

## 2020-03-20 NOTE — Discharge Instructions (Signed)
Take antibiotics as prescribed.  Take the entire course, even if your symptoms improve. Make sure you stay well-hydrated with water. Use Pyridium as needed for pain with urination.  This may turn your urine and/or your contacts at different color. Follow-up with urology if you continue to have blood in your urine after finishing antibiotics. Return to the emergency room if you develop high fevers, persistent vomiting, severe worsening pain, any new, worsening, or concerning symptoms.

## 2020-03-20 NOTE — Telephone Encounter (Signed)
Last OV 06/04/19 Adderall last filled 02/17/20 #30 with 0

## 2020-03-20 NOTE — ED Triage Notes (Signed)
Pt arrives with c/o burning with urination, frequent urination, and feeling like she can not empty her bladder. Pt reports this started today with no other symptoms and states she has been passing large amounts of blood and clots in her urine.

## 2020-03-20 NOTE — ED Provider Notes (Signed)
[facility] EMERGENCY DEPARTMENT Provider Note   CSN: [id number] Arrival date & time: 03/20/20  1825     History Chief Complaint  Patient presents with  . Hematuria    Jamie Baker is a 49 y.o. female presenting for evaluation of hematuria and dysuria.   Patient states today she developed dysuria and hematuria.  She states she has a feeling of insufficient voiding.  She denies fevers, chills, nausea, vomiting, abdominal pain or flank pain.  She has never had a UTI before.  She denies vaginal discharge.  She has not taken anything for her symptoms.   HPI     Past Medical History:  Diagnosis Date  . Allergy   . Bipolar disorder ([place])   . Bronchitis   . C. difficile diarrhea   . Chiari I malformation ([place])   . Chicken pox   . Complication of anesthesia   . Ehlers-Danlos syndrome   . GERD (gastroesophageal reflux disease)   . Intracranial hypertension   . Mast cell disease   . Migraine   . Neurogenic bladder   . Other abnormality of brain or central nervous system function study    cranial cervical instability  . Pneumonia    2015 ish  . PONV (postoperative nausea and vomiting)   . POTS (postural orthostatic tachycardia syndrome)   . Raynaud's syndrome   . Scoliosis   . Seizures ([place])   . Spina bifida occulta   . Stroke ([place])   . Syncope   . Trigeminal neuralgia   . Trigeminal neuralgia     Patient Active Problem List   Diagnosis Date Noted  . Overweight (BMI 25.0-29.9) 06/04/2019  . Cervical spine instability 11/10/2017  . Vitamin D deficiency 11/25/2016  . Herpes zoster without complication 28/41/3244  . HNP (herniated nucleus pulposus), lumbar 06/03/2016  . Chronic headaches 03/20/2015  . 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 Allergic rhinitis 01/30/2015  . History of pneumonia 12/28/2014  . 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 Dry mouth 08/15/2014  . 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 OSA on CPAP  05/16/2014  . Arnold-Chiari malformation, type I ([place]) 04/05/2014  . Hypersomnia with sleep apnea 04/05/2014  . Pituitary adenoma ([place]) 02/06/2014  . Neuralgia 01/20/2014  . Left temporal headache 01/20/2014  . Occipital neuralgia 11/02/2013  . 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 Temporary cerebral vascular dysfunction 10/26/2013  . POTS (postural orthostatic tachycardia syndrome) 06/21/2013  . Annual physical exam 11/19/2011  . Chiari I malformation [facility])     Past Surgical History:  Procedure Laterality Date  . ABDOMINAL HYSTERECTOMY  2011  . ACDF  2014   5,6,7   . ACHILLES TENDON LENGTHENING  2009  . ACHILLES TENDON REPAIR  2010  . ACHILLES TENDON REPAIR Left 01/2008  . chairi decompasion  2013  . CRANIECTOMY SUBOCCIPITAL W/ CERVICAL LAMINECTOMY / CHIARI     pt has metal in her neck  . Jugualr Stent Right 2017  . LAMINOPLASTY POSTERIOR CERVICAL SPINE  06/2014   C2-C4  . LUMBAR LAMINECTOMY/DECOMPRESSION MICRODISCECTOMY Right 06/03/2016   Procedure: Microdiscectomy - Lumbar four - lumbar five - right;  Surgeon: Kary Kos, MD;  Location: [facility];  Service: Neurosurgery;  Laterality: Right;  Microdiscectomy - Lumbar four - lumbar five - right  . ROBOTIC ASSISTED LAP VAGINAL HYSTERECTOMY    . spinal cord surgery    . TONSILLECTOMY  1975  . TONSILLECTOMY AND ADENOIDECTOMY  1975  . TUBAL LIGATION  1989  .  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     Family History  Problem Relation Age of Onset  . Arthritis Mother   . Breast cancer Mother   . Stroke Mother   . Hyperlipidemia Father   . Hypertension Father   . Diabetes Father   . Brain cancer Father   . Breast cancer Maternal Aunt   . Arthritis Maternal Grandmother   . Breast cancer Maternal Grandmother   . Stroke Maternal Grandfather   . Heart disease Maternal Grandfather   . Colon cancer Neg Hx   . Esophageal cancer Neg Hx   . Rectal  cancer Neg Hx   . Stomach cancer Neg Hx     Social History   Tobacco Use  . Smoking status: Never Smoker  . Smokeless tobacco: Never Used  Vaping Use  . Vaping Use: Never used  Substance Use Topics  . Alcohol use: Yes    Alcohol/week: 0.0 standard drinks    Comment: Rarely  . Drug use: No    Home Medications Prior to Admission medications   Medication Sig Start Date End Date Taking? Authorizing Provider  albuterol (PROAIR HFA) 108 (90 Base) MCG/ACT inhaler INHALE 2 PUFFS BY MOUTH INTO THE LUNGS EVERY 4 HOURS AS NEEDED FOR WHEEZING OR SHORTNESS OF BREATH 10/13/19   Midge Minium, MD  amphetamine-dextroamphetamine (ADDERALL XR) 20 MG 24 hr capsule Take 1 capsule (20 mg total) by mouth daily. 02/17/20   Midge Minium, MD  aspirin 325 MG tablet Take 325 mg by mouth daily.    [provider]  cephALEXin (KEFLEX) 500 MG capsule Take 1 capsule (500 mg total) by mouth 2 (two) times daily for 5 days. 03/20/20 03/25/20  Jamileth Putzier, PA-C  cetirizine (ZYRTEC) 10 MG tablet Take 10 mg by mouth daily.    [provider]  cloNIDine (CATAPRES) 0.1 MG tablet TAKE 1 TABLET BY MOUTH THREE TIMES DAILY, SPACED 3 HOURS APART 08/09/19   Midge Minium, MD  fludrocortisone (FLORINEF) 0.1 MG tablet TAKE 1 TABLET(0.1 MG) BY MOUTH DAILY 12/16/19   Midge Minium, MD  fluticasone (FLONASE) 50 MCG/ACT nasal spray SHAKE LIQUID AND USE 2 SPRAYS IN EACH NOSTRIL DAILY 05/15/17   Midge Minium, MD  midodrine (PROAMATINE) 10 MG tablet Take 1 tablet (10 mg total) by mouth 3 (three) times daily. 08/09/19   Midge Minium, MD  omeprazole (PRILOSEC) 10 MG capsule Take 10 mg by mouth daily.    [provider]  phenazopyridine (PYRIDIUM) 200 MG tablet Take 1 tablet (200 mg total) by mouth 3 (three) times daily. 03/20/20   Saidi Santacroce, PA-C  ranitidine (ZANTAC) 150 MG tablet Take 150 mg by mouth daily.    [provider]  Vitamin D, Ergocalciferol,  (DRISDOL) 1.25 MG (50000 UT) CAPS capsule Take 1 capsule (50,000 Units total) by mouth every 7 (seven) days. 05/21/18   Midge Minium, MD    Allergies    Levaquin [levofloxacin in d5w], Gabapentin, Adhesive [tape], Latex, and Zoloft [sertraline hcl]  Review of Systems   Review of Systems  Constitutional: Negative for fever.  Genitourinary: Positive for difficulty urinating, dysuria, hematuria and urgency.    Physical Exam Updated Vital Signs BP (!) 134/96 (BP Location: Right Arm)   Pulse 76   Temp 98.4 F (36.9 C) (Oral)   Resp 17   Ht 5\' 6"  (1.676 m)   Wt 73.9 kg   SpO2  96%   BMI 26.31 kg/m   Physical Exam Vitals and nursing note reviewed.  Constitutional:      General: She is not in acute distress.    Appearance: She is well-developed.  HENT:     Head: Normocephalic and atraumatic.  Cardiovascular:     Rate and Rhythm: Normal rate and regular rhythm.     Pulses: Normal pulses.  Pulmonary:     Effort: Pulmonary effort is normal.  Abdominal:     General: There is no distension.     Palpations: Abdomen is soft. There is no mass.     Tenderness: There is no abdominal tenderness. There is no right CVA tenderness, left CVA tenderness, guarding or rebound.     Comments: No TTP of the abdomen.  No rigidity, guarding, distention.  Negative rebound.  No peritonitis.  No CVA tenderness.  Musculoskeletal:        General: Normal range of motion.     Cervical back: Normal range of motion.  Skin:    General: Skin is warm.     Capillary Refill: Capillary refill takes less than 2 seconds.     Findings: No rash.  Neurological:     Mental Status: She is alert and oriented to person, place, and time.     ED Results / Procedures / Treatments   Labs (all labs ordered are listed, but only abnormal results are displayed) Labs Reviewed  URINALYSIS, ROUTINE W REFLEX MICROSCOPIC - Abnormal; Notable for the following components:      Result Value   Color, Urine BROWN (*)     APPearance CLOUDY (*)    Hgb urine dipstick LARGE (*)    Protein, ur 100 (*)    Leukocytes,Ua SMALL (*)    All other components within normal limits  BASIC METABOLIC PANEL - Abnormal; Notable for the following components:   Potassium 3.4 (*)    All other components within normal limits  CBC - Abnormal; Notable for the following components:   WBC 12.2 (*)    All other components within normal limits  URINALYSIS, MICROSCOPIC (REFLEX) - Abnormal; Notable for the following components:   Bacteria, UA MANY (*)    All other components within normal limits    EKG None  Radiology No results found.  Procedures Procedures (including critical care time)  Medications Ordered in ED Medications - No data to display  ED Course  I have reviewed the triage vital signs and the nursing notes.  Pertinent labs & imaging results that were available during my care of the patient were reviewed by me and considered in my medical decision making (see chart for details).    MDM Rules/Calculators/A&P                          Patient presenting for evaluation of dysuria, hematuria, and feeling of insufficient voiding.  On exam, patient is nontoxic.  She has no abdominal tenderness.  No fevers, chills, nausea, vomiting, flank pain.  Doubt Pyelo.  Likely UTI.  Will obtain urine and reassess.  Urine consistent with infection, and that patient has leuks, many bacteria, greater than 50 white cells.  Labs obtained from triage interpreted by me, mild leukocytosis of 12.2.  Creatinine stable.  I do not believe patient needs emergent CT, she has no flank pain and I have low suspicion for kidney stone or Pyelo.  Discussed with patient.  Discussed treatment with Pyridium and antibiotics.  Encourage patient to  follow-up with urology if she continues to have hematuria after treatment of infection.  At this time, patient appears safe for discharge.  Return precautions given.  Patient states she understands and agrees to  plan.  Final Clinical Impression(s) / ED Diagnoses Final diagnoses:  Acute cystitis with hematuria    Rx / DC Orders ED Discharge Orders         Ordered    cephALEXin (KEFLEX) 500 MG capsule  2 times daily        03/20/20 2103    phenazopyridine (PYRIDIUM) 200 MG tablet  3 times daily        03/20/20 2103           Franchot Heidelberg, PA-C 03/20/20 2345    Quintella Reichert, MD 03/22/20 0004

## 2020-03-21 MED ORDER — AMPHETAMINE-DEXTROAMPHET ER 20 MG PO CP24: 20.0000 mg | ORAL_CAPSULE | Freq: Every day | ORAL | 0 refills | Status: DC

## 2020-04-04 ENCOUNTER — Encounter: Payer: Self-pay | Admitting: Family Medicine

## 2020-04-21 ENCOUNTER — Encounter: Payer: Self-pay | Admitting: Family Medicine

## 2020-04-21 MED ORDER — AMPHETAMINE-DEXTROAMPHET ER 20 MG PO CP24: 20.0000 mg | ORAL_CAPSULE | Freq: Every day | ORAL | 0 refills | Status: DC

## 2020-05-22 ENCOUNTER — Encounter: Payer: Self-pay | Admitting: Family Medicine

## 2020-05-22 ENCOUNTER — Other Ambulatory Visit: Payer: Self-pay

## 2020-05-22 MED ORDER — AMPHETAMINE-DEXTROAMPHET ER 20 MG PO CP24: 20.0000 mg | ORAL_CAPSULE | Freq: Every day | ORAL | 0 refills | Status: DC

## 2020-05-22 NOTE — Telephone Encounter (Signed)
Dr Beverely Low:  Can you please call in my Adderall prescription?  Thank you. Kylar LFD 04/21/20 #30 with no refills LOV 06/04/19 NOV none

## 2020-05-25 ENCOUNTER — Other Ambulatory Visit: Payer: Self-pay | Admitting: Family Medicine

## 2020-06-26 ENCOUNTER — Encounter: Payer: Self-pay | Admitting: Family Medicine

## 2020-06-26 ENCOUNTER — Other Ambulatory Visit: Payer: Self-pay

## 2020-06-26 DIAGNOSIS — F988 Other specified behavioral and emotional disorders with onset usually occurring in childhood and adolescence: Secondary | ICD-10-CM

## 2020-06-29 MED ORDER — AMPHETAMINE-DEXTROAMPHET ER 20 MG PO CP24: 20.0000 mg | ORAL_CAPSULE | Freq: Every day | ORAL | 0 refills | Status: DC

## 2020-07-31 ENCOUNTER — Encounter: Payer: Self-pay | Admitting: Family Medicine

## 2020-07-31 MED ORDER — AMPHETAMINE-DEXTROAMPHET ER 20 MG PO CP24: 20.0000 mg | ORAL_CAPSULE | Freq: Every day | ORAL | 0 refills | Status: DC

## 2020-08-21 ENCOUNTER — Other Ambulatory Visit: Payer: Self-pay

## 2020-08-21 ENCOUNTER — Encounter: Payer: Self-pay | Admitting: Family Medicine

## 2020-08-21 ENCOUNTER — Ambulatory Visit: Payer: 59 | Admitting: Family Medicine

## 2020-08-21 VITALS — BP 118/80 | HR 89 | Temp 98.1°F | Resp 17 | Ht 64.0 in | Wt 176.8 lb

## 2020-08-21 DIAGNOSIS — E559 Vitamin D deficiency, unspecified: Secondary | ICD-10-CM | POA: Diagnosis not present

## 2020-08-21 DIAGNOSIS — E669 Obesity, unspecified: Secondary | ICD-10-CM | POA: Diagnosis not present

## 2020-08-21 DIAGNOSIS — Q796 Ehlers-Danlos syndrome, unspecified: Secondary | ICD-10-CM | POA: Insufficient documentation

## 2020-08-21 DIAGNOSIS — D352 Benign neoplasm of pituitary gland: Secondary | ICD-10-CM

## 2020-08-21 DIAGNOSIS — Z982 Presence of cerebrospinal fluid drainage device: Secondary | ICD-10-CM | POA: Insufficient documentation

## 2020-08-21 DIAGNOSIS — Z23 Encounter for immunization: Secondary | ICD-10-CM

## 2020-08-21 DIAGNOSIS — Z0000 Encounter for general adult medical examination without abnormal findings: Secondary | ICD-10-CM

## 2020-08-21 DIAGNOSIS — E2740 Unspecified adrenocortical insufficiency: Secondary | ICD-10-CM

## 2020-08-21 DIAGNOSIS — I871 Compression of vein: Secondary | ICD-10-CM | POA: Insufficient documentation

## 2020-08-21 NOTE — Patient Instructions (Signed)
Follow up in 1 year or as needed We'll notify you of your lab results and make any changes if needed Continue to work on healthy diet and regular exercise- you're doing great! Make sure you are eating regularly to make sure your body has enough fuel to keep going Call with any questions or concerns Stay Safe!  Stay Healthy! ENJOY YOUR VACATION!!!

## 2020-08-21 NOTE — Assessment & Plan Note (Signed)
New.  Pt's BMI is now 30.35  Discussed need for healthy diet, regular exercise, and making sure she has enough calories throughout the day as she is likely under eating.  Check labs to risk stratify.  Will follow.

## 2020-08-21 NOTE — Assessment & Plan Note (Signed)
Currently asymptomatic on Florinef

## 2020-08-21 NOTE — Progress Notes (Signed)
   Subjective:    Patient ID: Jamie Baker, female    DOB: 1970-07-16, 50 y.o.   MRN: [id number]  HPI CPE- UTD on colonoscopy, mammo.  Due for Tdap.  Declines flu and COVID.  Reviewed past medical, surgical, family and social histories.   Patient Care Team    Relationship Specialty Notifications Start End  Midge Minium, MD PCP - General Family Medicine  11/19/11   Ladene Artist, MD Consulting Physician Gastroenterology  09/28/15   Lynder Parents., MD Consulting Physician Obstetrics and Gynecology  06/04/19     Health Maintenance  Topic Date Due  . COVID-19 Vaccine (1) 09/06/2020 (Originally 05/30/1975)  . TETANUS/TDAP  01/01/2021 (Originally 07/19/2019)  . Hepatitis C Screening  08/21/2021 (Originally 12-26-1970)  . HIV Screening  08/21/2021 (Originally 05/31/1983)  . INFLUENZA VACCINE  12/18/2020  . MAMMOGRAM  08/08/2021  . COLONOSCOPY (Pts 45-47yrs Insurance coverage will need to be confirmed)  10/20/2023  . HPV VACCINES  Aged Out      Review of Systems Patient reports no vision/ hearing changes, adenopathy,fever, weight change,  persistant/recurrent hoarseness , swallowing issues, chest pain, palpitations, edema, persistant/recurrent cough, hemoptysis, dyspnea (rest/exertional/paroxysmal nocturnal), gastrointestinal bleeding (melena, rectal bleeding), abdominal pain, significant heartburn, bowel changes, GU symptoms (dysuria, hematuria, incontinence), Gyn symptoms (abnormal  bleeding, pain),  syncope, focal weakness, memory loss, numbness & tingling, skin/hair/nail changes, abnormal bruising or bleeding, anxiety, or depression.   This visit occurred during the SARS-CoV-2 public health emergency.  Safety protocols were in place, including screening questions prior to the visit, additional usage of staff PPE, and extensive cleaning of exam room while observing appropriate contact time as indicated for disinfecting solutions.       Objective:   Physical Exam General Appearance:     Alert, cooperative, no distress, appears stated age  Head:    Normocephalic, without obvious abnormality, atraumatic  Eyes:    PERRL, conjunctiva/corneas clear, EOM's intact, fundi    benign, both eyes  Ears:    Normal TM's and external ear canals, both ears  Nose:   Deferred due to COVID  Throat:   Neck:   Supple, symmetrical, trachea midline, no adenopathy;    Thyroid: no enlargement/tenderness/nodules  Back:     Symmetric, no curvature, ROM normal, no CVA tenderness  Lungs:     Clear to auscultation bilaterally, respirations unlabored  Chest Wall:    No tenderness or deformity   Heart:    Regular rate and rhythm, S1 and S2 normal, no murmur, rub   or gallop  Breast Exam:    Deferred to mammo  Abdomen:     Soft, non-tender, bowel sounds active all four quadrants,    no masses, no organomegaly  Genitalia:    Deferred to GYN  Rectal:    Extremities:   Extremities normal, atraumatic, no cyanosis or edema  Pulses:   2+ and symmetric all extremities  Skin:   Skin color, texture, turgor normal, no rashes or lesions  Lymph nodes:   Cervical, supraclavicular, and axillary nodes normal  Neurologic:   CNII-XII intact, normal strength, sensation and reflexes    throughout          Assessment & Plan:

## 2020-08-21 NOTE — Assessment & Plan Note (Signed)
Pt's PE unchanged from previous and WNL w/ exception of obesity.  UTD on mammo, colonoscopy.  Tdap given.  Check labs.  Anticipatory guidance provided.

## 2020-08-22 LAB — LIPID PANEL
Cholesterol: 175 mg/dL (ref 0–200)
HDL: 63.6 mg/dL (ref >39.00)
LDL Cholesterol: 89 mg/dL (ref 0–99)
NonHDL: 111.48
Total CHOL/HDL Ratio: 3
Triglycerides: 110 mg/dL (ref 0.0–149.0)
VLDL: 22 mg/dL (ref 0.0–40.0)

## 2020-08-22 LAB — CBC WITH DIFFERENTIAL/PLATELET
Basophils Absolute: 0.1 10*3/uL (ref 0.0–0.1)
Basophils Relative: 1 % (ref 0.0–3.0)
Eosinophils Absolute: 0.1 10*3/uL (ref 0.0–0.7)
Eosinophils Relative: 2 % (ref 0.0–5.0)
HCT: 40 % (ref 36.0–46.0)
Hemoglobin: 13.4 g/dL (ref 12.0–15.0)
Lymphocytes Relative: 25.2 % (ref 12.0–46.0)
Lymphs Abs: 1.4 10*3/uL (ref 0.7–4.0)
MCHC: 33.4 g/dL (ref 30.0–36.0)
MCV: 86.7 fl (ref 78.0–100.0)
Monocytes Absolute: 0.6 10*3/uL (ref 0.1–1.0)
Monocytes Relative: 11.1 % (ref 3.0–12.0)
Neutro Abs: 3.3 10*3/uL (ref 1.4–7.7)
Neutrophils Relative %: 60.7 % (ref 43.0–77.0)
Platelets: 270 10*3/uL (ref 150.0–400.0)
RBC: 4.62 Mil/uL (ref 3.87–5.11)
RDW: 13.7 % (ref 11.5–15.5)
WBC: 5.4 10*3/uL (ref 4.0–10.5)

## 2020-08-22 LAB — HEPATIC FUNCTION PANEL
ALT: 16 U/L (ref 0–35)
AST: 19 U/L (ref 0–37)
Albumin: 4.1 g/dL (ref 3.5–5.2)
Alkaline Phosphatase: 58 U/L (ref 39–117)
Bilirubin, Direct: 0.1 mg/dL (ref 0.0–0.3)
Total Bilirubin: 0.2 mg/dL (ref 0.2–1.2)
Total Protein: 6.7 g/dL (ref 6.0–8.3)

## 2020-08-22 LAB — TSH: TSH: 1.36 u[IU]/mL (ref 0.35–4.50)

## 2020-08-22 LAB — BASIC METABOLIC PANEL
BUN: 15 mg/dL (ref 6–23)
CO2: 28 mEq/L (ref 19–32)
Calcium: 9.2 mg/dL (ref 8.4–10.5)
Chloride: 103 mEq/L (ref 96–112)
Creatinine, Ser: 0.87 mg/dL (ref 0.40–1.20)
GFR: 76.7 mL/min (ref >60.00)
Glucose, Bld: 82 mg/dL (ref 70–99)
Potassium: 4.4 mEq/L (ref 3.5–5.1)
Sodium: 139 mEq/L (ref 135–145)

## 2020-08-22 LAB — VITAMIN D 25 HYDROXY (VIT D DEFICIENCY, FRACTURES): VITD: 39.54 ng/mL (ref 30.00–100.00)

## 2020-08-31 ENCOUNTER — Other Ambulatory Visit: Payer: Self-pay

## 2020-08-31 ENCOUNTER — Encounter: Payer: Self-pay | Admitting: Family Medicine

## 2020-08-31 ENCOUNTER — Ambulatory Visit: Payer: 59 | Admitting: Family Medicine

## 2020-08-31 NOTE — Telephone Encounter (Signed)
Patient is requesting a refill of the following medications: Requested Prescriptions   Pending Prescriptions Disp Refills  . amphetamine-dextroamphetamine (ADDERALL XR) 20 MG 24 hr capsule 30 capsule 0    Sig: Take 1 capsule (20 mg total) by mouth daily. LAST REFILL W/OUT APPT    Date of patient request: 08/31/2020 Last office visit: 08/21/2020 Date of last refill: 07/31/2020 Last refill amount: 30 Follow up time period per chart: was for 08/31/2020 appointment cancelled

## 2020-08-31 NOTE — Telephone Encounter (Signed)
Medication request was sent. Patient medication will most likely be refused at this time. Last refill was 07/31/2020 and had a note that say no refill without an appointment one was scheduled for today 08/31/2020 but looks like it has been cancelled.

## 2020-09-04 ENCOUNTER — Other Ambulatory Visit: Payer: Self-pay

## 2020-09-04 ENCOUNTER — Other Ambulatory Visit: Payer: Self-pay | Admitting: Registered Nurse

## 2020-09-04 DIAGNOSIS — F988 Other specified behavioral and emotional disorders with onset usually occurring in childhood and adolescence: Secondary | ICD-10-CM

## 2020-09-04 MED ORDER — AMPHETAMINE-DEXTROAMPHET ER 20 MG PO CP24: 20.0000 mg | ORAL_CAPSULE | Freq: Every day | ORAL | 0 refills | Status: DC

## 2020-09-04 NOTE — Telephone Encounter (Signed)
Adderall last filled 07/31/20 # 30 with no refills LOV 08/21/20 NOV none

## 2020-09-04 NOTE — Telephone Encounter (Signed)
Called patient to let her know that the medication refill was sent to the pharmacy and to also make sure it was sent to the correct pharmacy.

## 2020-09-05 ENCOUNTER — Encounter: Payer: Self-pay | Admitting: Family Medicine

## 2020-09-05 ENCOUNTER — Other Ambulatory Visit: Payer: Self-pay

## 2020-09-05 MED ORDER — CLONIDINE HCL 0.1 MG PO TABS: ORAL_TABLET | ORAL | 3 refills | Status: DC

## 2020-09-05 MED ORDER — MIDODRINE HCL 10 MG PO TABS: 10.0000 mg | ORAL_TABLET | Freq: Three times a day (TID) | ORAL | 1 refills | Status: DC

## 2020-10-05 ENCOUNTER — Encounter: Payer: Self-pay | Admitting: Family Medicine

## 2020-10-06 ENCOUNTER — Other Ambulatory Visit: Payer: Self-pay

## 2020-10-06 DIAGNOSIS — F988 Other specified behavioral and emotional disorders with onset usually occurring in childhood and adolescence: Secondary | ICD-10-CM

## 2020-10-06 MED ORDER — AMPHETAMINE-DEXTROAMPHET ER 20 MG PO CP24: 20.0000 mg | ORAL_CAPSULE | Freq: Every day | ORAL | 0 refills | Status: DC

## 2020-10-06 NOTE — Telephone Encounter (Signed)
LFD 09/04/20 #30 with no refills LOV 08/21/20 NOV none

## 2020-11-06 ENCOUNTER — Other Ambulatory Visit: Payer: Self-pay | Admitting: Family Medicine

## 2020-11-06 DIAGNOSIS — F988 Other specified behavioral and emotional disorders with onset usually occurring in childhood and adolescence: Secondary | ICD-10-CM

## 2020-11-06 MED ORDER — AMPHETAMINE-DEXTROAMPHET ER 20 MG PO CP24: 20.0000 mg | ORAL_CAPSULE | Freq: Every day | ORAL | 0 refills | Status: DC

## 2020-11-06 NOTE — Telephone Encounter (Signed)
LFD 06/05/20 #90 with no refills LOV 08/21/20 NOV none

## 2020-11-06 NOTE — Telephone Encounter (Signed)
Patient is requesting a refill of the following medications: Requested Prescriptions   Pending Prescriptions Disp Refills   amphetamine-dextroamphetamine (ADDERALL XR) 20 MG 24 hr capsule 30 capsule 0    Sig: Take 1 capsule (20 mg total) by mouth daily.    Date of patient request: 11/06/2020 Last office visit: 08/21/2020 Date of last refill: 10/06/2020 Last refill amount: 30 capsules  Follow up time period per chart: N/A

## 2020-11-15 ENCOUNTER — Encounter: Payer: Self-pay | Admitting: *Deleted

## 2020-12-05 ENCOUNTER — Other Ambulatory Visit: Payer: Self-pay | Admitting: Family Medicine

## 2020-12-05 DIAGNOSIS — F988 Other specified behavioral and emotional disorders with onset usually occurring in childhood and adolescence: Secondary | ICD-10-CM

## 2020-12-06 MED ORDER — AMPHETAMINE-DEXTROAMPHET ER 20 MG PO CP24: 20.0000 mg | ORAL_CAPSULE | Freq: Every day | ORAL | 0 refills | Status: DC

## 2020-12-06 NOTE — Telephone Encounter (Signed)
Last filled 11/06/20 #30 with no refills Last visit 08/21/20 Next visit none

## 2020-12-24 NOTE — Telephone Encounter (Signed)
error 

## 2021-01-05 ENCOUNTER — Encounter: Payer: Self-pay | Admitting: Family Medicine

## 2021-01-05 ENCOUNTER — Other Ambulatory Visit: Payer: Self-pay

## 2021-01-05 DIAGNOSIS — F988 Other specified behavioral and emotional disorders with onset usually occurring in childhood and adolescence: Secondary | ICD-10-CM

## 2021-01-05 MED ORDER — AMPHETAMINE-DEXTROAMPHET ER 20 MG PO CP24: 20.0000 mg | ORAL_CAPSULE | Freq: Every day | ORAL | 0 refills | Status: DC

## 2021-01-05 NOTE — Telephone Encounter (Signed)
I tried to refill my Adderall on My Chart, but I keep getting an error.  LFD 12/06/20 #30 with no refills LOV 08/21/20 NOV none

## 2021-02-08 ENCOUNTER — Other Ambulatory Visit: Payer: Self-pay

## 2021-02-08 ENCOUNTER — Encounter: Payer: Self-pay | Admitting: Family Medicine

## 2021-02-08 DIAGNOSIS — F988 Other specified behavioral and emotional disorders with onset usually occurring in childhood and adolescence: Secondary | ICD-10-CM

## 2021-02-08 MED ORDER — FLUDROCORTISONE ACETATE 0.1 MG PO TABS: ORAL_TABLET | ORAL | 0 refills | Status: DC

## 2021-02-08 MED ORDER — AMPHETAMINE-DEXTROAMPHET ER 20 MG PO CP24: 20.0000 mg | ORAL_CAPSULE | Freq: Every day | ORAL | 0 refills | Status: DC

## 2021-03-12 ENCOUNTER — Other Ambulatory Visit: Payer: Self-pay | Admitting: Family Medicine

## 2021-03-12 DIAGNOSIS — F988 Other specified behavioral and emotional disorders with onset usually occurring in childhood and adolescence: Secondary | ICD-10-CM

## 2021-03-12 MED ORDER — AMPHETAMINE-DEXTROAMPHET ER 20 MG PO CP24: 20.0000 mg | ORAL_CAPSULE | Freq: Every day | ORAL | 0 refills | Status: DC

## 2021-03-12 MED ORDER — FLUDROCORTISONE ACETATE 0.1 MG PO TABS: ORAL_TABLET | ORAL | 0 refills | Status: DC

## 2021-04-11 ENCOUNTER — Other Ambulatory Visit: Payer: Self-pay | Admitting: Family Medicine

## 2021-04-11 DIAGNOSIS — F988 Other specified behavioral and emotional disorders with onset usually occurring in childhood and adolescence: Secondary | ICD-10-CM

## 2021-04-11 MED ORDER — FLUDROCORTISONE ACETATE 0.1 MG PO TABS: ORAL_TABLET | ORAL | 0 refills | Status: DC

## 2021-04-11 MED ORDER — AMPHETAMINE-DEXTROAMPHET ER 20 MG PO CP24: 20.0000 mg | ORAL_CAPSULE | Freq: Every day | ORAL | 0 refills | Status: DC

## 2021-05-15 ENCOUNTER — Other Ambulatory Visit: Payer: Self-pay | Admitting: Registered Nurse

## 2021-05-15 DIAGNOSIS — F988 Other specified behavioral and emotional disorders with onset usually occurring in childhood and adolescence: Secondary | ICD-10-CM

## 2021-05-15 MED ORDER — FLUDROCORTISONE ACETATE 0.1 MG PO TABS: ORAL_TABLET | ORAL | 0 refills | Status: DC

## 2021-05-15 MED ORDER — AMPHETAMINE-DEXTROAMPHET ER 20 MG PO CP24: 20.0000 mg | ORAL_CAPSULE | Freq: Every day | ORAL | 0 refills | Status: DC

## 2021-05-19 ENCOUNTER — Encounter: Payer: Self-pay | Admitting: Family Medicine

## 2021-05-19 ENCOUNTER — Other Ambulatory Visit: Payer: Self-pay | Admitting: Family Medicine

## 2021-05-19 DIAGNOSIS — F988 Other specified behavioral and emotional disorders with onset usually occurring in childhood and adolescence: Secondary | ICD-10-CM

## 2021-05-21 ENCOUNTER — Encounter: Payer: Self-pay | Admitting: Family Medicine

## 2021-05-22 MED ORDER — AMPHETAMINE-DEXTROAMPHET ER 20 MG PO CP24: 20.0000 mg | ORAL_CAPSULE | Freq: Every day | ORAL | 0 refills | Status: DC

## 2021-06-22 ENCOUNTER — Other Ambulatory Visit: Payer: Self-pay | Admitting: Family Medicine

## 2021-06-22 DIAGNOSIS — F988 Other specified behavioral and emotional disorders with onset usually occurring in childhood and adolescence: Secondary | ICD-10-CM

## 2021-06-22 MED ORDER — FLUDROCORTISONE ACETATE 0.1 MG PO TABS: ORAL_TABLET | ORAL | 0 refills | Status: DC

## 2021-06-22 MED ORDER — AMPHETAMINE-DEXTROAMPHET ER 20 MG PO CP24: 20.0000 mg | ORAL_CAPSULE | Freq: Every day | ORAL | 0 refills | Status: DC

## 2021-07-24 ENCOUNTER — Other Ambulatory Visit: Payer: Self-pay | Admitting: Family Medicine

## 2021-07-24 DIAGNOSIS — F988 Other specified behavioral and emotional disorders with onset usually occurring in childhood and adolescence: Secondary | ICD-10-CM

## 2021-07-25 MED ORDER — AMPHETAMINE-DEXTROAMPHET ER 20 MG PO CP24: 20.0000 mg | ORAL_CAPSULE | Freq: Every day | ORAL | 0 refills | Status: DC

## 2021-08-20 ENCOUNTER — Other Ambulatory Visit: Payer: Self-pay

## 2021-08-20 MED ORDER — CLONIDINE HCL 0.1 MG PO TABS: ORAL_TABLET | ORAL | 0 refills | Status: DC

## 2021-08-22 ENCOUNTER — Other Ambulatory Visit: Payer: Self-pay | Admitting: Family Medicine

## 2021-08-22 DIAGNOSIS — F988 Other specified behavioral and emotional disorders with onset usually occurring in childhood and adolescence: Secondary | ICD-10-CM

## 2021-08-23 MED ORDER — AMPHETAMINE-DEXTROAMPHET ER 20 MG PO CP24: 20.0000 mg | ORAL_CAPSULE | Freq: Every day | ORAL | 0 refills | Status: DC

## 2021-08-26 ENCOUNTER — Encounter: Payer: Self-pay | Admitting: Family Medicine

## 2021-08-28 ENCOUNTER — Other Ambulatory Visit: Payer: Self-pay | Admitting: Registered Nurse

## 2021-08-28 DIAGNOSIS — F988 Other specified behavioral and emotional disorders with onset usually occurring in childhood and adolescence: Secondary | ICD-10-CM

## 2021-08-28 MED ORDER — AMPHETAMINE-DEXTROAMPHETAMINE 20 MG PO TABS: 20.0000 mg | ORAL_TABLET | Freq: Every day | ORAL | 0 refills | Status: DC

## 2021-09-14 ENCOUNTER — Other Ambulatory Visit: Payer: Self-pay | Admitting: Family Medicine

## 2021-09-14 MED ORDER — FLUDROCORTISONE ACETATE 0.1 MG PO TABS: ORAL_TABLET | ORAL | 0 refills | Status: DC

## 2021-09-27 ENCOUNTER — Other Ambulatory Visit: Payer: Self-pay | Admitting: Registered Nurse

## 2021-09-27 DIAGNOSIS — F988 Other specified behavioral and emotional disorders with onset usually occurring in childhood and adolescence: Secondary | ICD-10-CM

## 2021-09-27 MED ORDER — AMPHETAMINE-DEXTROAMPHETAMINE 20 MG PO TABS: 20.0000 mg | ORAL_TABLET | Freq: Every day | ORAL | 0 refills | Status: DC

## 2021-09-27 NOTE — Telephone Encounter (Signed)
Patient is requesting a refill of the following medications: ?Requested Prescriptions  ? ?Pending Prescriptions Disp Refills  ? amphetamine-dextroamphetamine (ADDERALL) 20 MG tablet 30 tablet 0  ?  Sig: Take 1 tablet (20 mg total) by mouth daily.  ? ? ?Date of patient request: 09/27/2021 ?Last office visit: 08/21/2020 ?Date of last refill: 08/28/2021 ?Last refill amount: 30 tablets  ?Follow up time period per chart: none  ? ?

## 2021-10-10 ENCOUNTER — Encounter: Payer: Self-pay | Admitting: Family Medicine

## 2021-10-31 ENCOUNTER — Other Ambulatory Visit: Payer: Self-pay | Admitting: Family Medicine

## 2021-10-31 DIAGNOSIS — F988 Other specified behavioral and emotional disorders with onset usually occurring in childhood and adolescence: Secondary | ICD-10-CM

## 2021-10-31 MED ORDER — AMPHETAMINE-DEXTROAMPHETAMINE 20 MG PO TABS: 20.0000 mg | ORAL_TABLET | Freq: Every day | ORAL | 0 refills | Status: DC

## 2021-10-31 MED ORDER — CLONIDINE HCL 0.1 MG PO TABS: ORAL_TABLET | ORAL | 0 refills | Status: DC

## 2021-11-16 ENCOUNTER — Ambulatory Visit (INDEPENDENT_AMBULATORY_CARE_PROVIDER_SITE_OTHER): Payer: 59 | Admitting: Family Medicine

## 2021-11-16 ENCOUNTER — Encounter: Payer: Self-pay | Admitting: Family Medicine

## 2021-11-16 VITALS — BP 122/80 | HR 73 | Temp 98.1°F | Resp 16 | Ht 64.0 in | Wt 176.1 lb

## 2021-11-16 DIAGNOSIS — E2740 Unspecified adrenocortical insufficiency: Secondary | ICD-10-CM | POA: Diagnosis not present

## 2021-11-16 DIAGNOSIS — Z0000 Encounter for general adult medical examination without abnormal findings: Secondary | ICD-10-CM | POA: Diagnosis not present

## 2021-11-16 DIAGNOSIS — E669 Obesity, unspecified: Secondary | ICD-10-CM

## 2021-11-16 DIAGNOSIS — E559 Vitamin D deficiency, unspecified: Secondary | ICD-10-CM

## 2021-11-16 LAB — BASIC METABOLIC PANEL
BUN: 16 mg/dL (ref 6–23)
CO2: 33 mEq/L — ABNORMAL HIGH (ref 19–32)
Calcium: 9.9 mg/dL (ref 8.4–10.5)
Chloride: 104 mEq/L (ref 96–112)
Creatinine, Ser: 0.92 mg/dL (ref 0.40–1.20)
GFR: 71.11 mL/min (ref >60.00)
Glucose, Bld: 84 mg/dL (ref 70–99)
Potassium: 4.3 mEq/L (ref 3.5–5.1)
Sodium: 141 mEq/L (ref 135–145)

## 2021-11-16 LAB — TSH: TSH: 1.73 u[IU]/mL (ref 0.35–5.50)

## 2021-11-16 LAB — HEPATIC FUNCTION PANEL
ALT: 13 U/L (ref 0–35)
AST: 17 U/L (ref 0–37)
Albumin: 4.7 g/dL (ref 3.5–5.2)
Alkaline Phosphatase: 58 U/L (ref 39–117)
Bilirubin, Direct: 0.1 mg/dL (ref 0.0–0.3)
Total Bilirubin: 0.3 mg/dL (ref 0.2–1.2)
Total Protein: 7.3 g/dL (ref 6.0–8.3)

## 2021-11-16 LAB — LIPID PANEL
Cholesterol: 185 mg/dL (ref 0–200)
HDL: 60.6 mg/dL (ref >39.00)
LDL Cholesterol: 110 mg/dL — ABNORMAL HIGH (ref 0–99)
NonHDL: 123.92
Total CHOL/HDL Ratio: 3
Triglycerides: 72 mg/dL (ref 0.0–149.0)
VLDL: 14.4 mg/dL (ref 0.0–40.0)

## 2021-11-16 LAB — CBC WITH DIFFERENTIAL/PLATELET
Basophils Absolute: 0 10*3/uL (ref 0.0–0.1)
Basophils Relative: 0.3 % (ref 0.0–3.0)
Eosinophils Absolute: 0.1 10*3/uL (ref 0.0–0.7)
Eosinophils Relative: 1.6 % (ref 0.0–5.0)
HCT: 40.4 % (ref 36.0–46.0)
Hemoglobin: 13.1 g/dL (ref 12.0–15.0)
Lymphocytes Relative: 25.4 % (ref 12.0–46.0)
Lymphs Abs: 1.3 10*3/uL (ref 0.7–4.0)
MCHC: 32.5 g/dL (ref 30.0–36.0)
MCV: 88.7 fl (ref 78.0–100.0)
Monocytes Absolute: 0.5 10*3/uL (ref 0.1–1.0)
Monocytes Relative: 9.6 % (ref 3.0–12.0)
Neutro Abs: 3.2 10*3/uL (ref 1.4–7.7)
Neutrophils Relative %: 63.1 % (ref 43.0–77.0)
Platelets: 220 10*3/uL (ref 150.0–400.0)
RBC: 4.56 Mil/uL (ref 3.87–5.11)
RDW: 13.7 % (ref 11.5–15.5)
WBC: 5.1 10*3/uL (ref 4.0–10.5)

## 2021-11-16 LAB — VITAMIN D 25 HYDROXY (VIT D DEFICIENCY, FRACTURES): VITD: 49.53 ng/mL (ref 30.00–100.00)

## 2021-11-16 NOTE — Assessment & Plan Note (Signed)
Ongoing issue.  Encouraged low carb diet and regular exercise.  Check labs to risk stratify.  Will follow.

## 2021-11-16 NOTE — Assessment & Plan Note (Signed)
Check labs and replete prn. 

## 2021-11-16 NOTE — Patient Instructions (Signed)
Follow up in 1 year or as needed We'll notify you of your lab results and make any changes if needed Keep up the good work on healthy diet and regular exercise- you look great! Call with any questions or concerns Stay Safe!  Stay Healthy! HAVE AN AMAZING TRIP!!!

## 2021-11-16 NOTE — Progress Notes (Signed)
   Subjective:    Patient ID: Jamie Baker, female    DOB: July 10, 1970, 51 y.o.   MRN: [id number]  HPI CPE- UTD on mammo, colonoscopy, Tdap.  No need for pap due to TAH.  No concerns today.  Patient Care Team    Relationship Specialty Notifications Start End  Midge Minium, MD PCP - General Family Medicine  11/19/11   Ladene Artist, MD Consulting Physician Gastroenterology  09/28/15   Lynder Parents., MD Consulting Physician Obstetrics and Gynecology  06/04/19     Health Maintenance  Topic Date Due   Zoster Vaccines- Shingrix (1 of 2) 02/16/2022 (Originally 05/30/2018)   INFLUENZA VACCINE  12/18/2021   MAMMOGRAM  04/18/2022   COLONOSCOPY (Pts 45-60yr Insurance coverage will need to be confirmed)  10/20/2023   TETANUS/TDAP  08/22/2030   HPV VACCINES  Aged Out   COVID-19 Vaccine  Discontinued   Hepatitis C Screening  Discontinued   HIV Screening  Discontinued      Review of Systems Patient reports no vision/ hearing changes, adenopathy,fever, weight change,  persistant/recurrent hoarseness , swallowing issues, chest pain, palpitations, edema, persistant/recurrent cough, hemoptysis, dyspnea (rest/exertional/paroxysmal nocturnal), gastrointestinal bleeding (melena, rectal bleeding), abdominal pain, significant heartburn, bowel changes, GU symptoms (dysuria, hematuria, incontinence), Gyn symptoms (abnormal  bleeding, pain),  syncope, focal weakness, memory loss, numbness & tingling, skin/hair/nail changes, abnormal bruising or bleeding, anxiety, or depression.     Objective:   Physical Exam General Appearance:    Alert, cooperative, no distress, appears stated age  Head:    Normocephalic, without obvious abnormality, atraumatic  Eyes:    PERRL, conjunctiva/corneas clear, EOM's intact, fundi    benign, both eyes  Ears:    Normal TM's and external ear canals, both ears  Nose:   Nares normal, septum midline, mucosa normal, no drainage    or sinus tenderness  Throat:   Lips,  mucosa, and tongue normal; teeth and gums normal  Neck:   Supple, symmetrical, trachea midline, no adenopathy;    Thyroid: no enlargement/tenderness/nodules  Back:     Symmetric, no curvature, ROM normal, no CVA tenderness  Lungs:     Clear to auscultation bilaterally, respirations unlabored  Chest Wall:    No tenderness or deformity   Heart:    Regular rate and rhythm, S1 and S2 normal, no murmur, rub   or gallop  Breast Exam:    Deferred to GYN  Abdomen:     Soft, non-tender, bowel sounds active all four quadrants,    no masses, no organomegaly  Genitalia:    Deferred to GYN  Rectal:    Extremities:   Extremities normal, atraumatic, no cyanosis or edema  Pulses:   2+ and symmetric all extremities  Skin:   Skin color, texture, turgor normal, no rashes or lesions  Lymph nodes:   Cervical, supraclavicular, and axillary nodes normal  Neurologic:   CNII-XII intact, normal strength, sensation and reflexes    throughout          Assessment & Plan:

## 2021-11-16 NOTE — Assessment & Plan Note (Signed)
Ongoing issue.  Currently on Florinef.

## 2021-11-16 NOTE — Assessment & Plan Note (Signed)
Pt's PE WNL w/ exception of BMI.  UTD on mammo, colonoscopy, Tdap.  No need for pap due to TAH.  Check labs.  Anticipatory guidance provided.

## 2021-11-19 ENCOUNTER — Encounter: Payer: Self-pay | Admitting: Family Medicine

## 2021-11-19 NOTE — Telephone Encounter (Signed)
Pt has questions about elevated CO2 level please advise

## 2021-11-21 NOTE — Progress Notes (Signed)
Pt seen lab results Via my chart  

## 2021-11-26 ENCOUNTER — Other Ambulatory Visit: Payer: Self-pay

## 2021-11-26 DIAGNOSIS — D352 Benign neoplasm of pituitary gland: Secondary | ICD-10-CM

## 2021-11-26 MED ORDER — MIDODRINE HCL 10 MG PO TABS: 10.0000 mg | ORAL_TABLET | Freq: Three times a day (TID) | ORAL | 1 refills | Status: DC

## 2021-11-28 ENCOUNTER — Other Ambulatory Visit: Payer: Self-pay

## 2021-11-28 DIAGNOSIS — D352 Benign neoplasm of pituitary gland: Secondary | ICD-10-CM

## 2021-11-28 MED ORDER — MIDODRINE HCL 10 MG PO TABS: 10.0000 mg | ORAL_TABLET | Freq: Three times a day (TID) | ORAL | 0 refills | Status: AC

## 2021-12-13 ENCOUNTER — Other Ambulatory Visit: Payer: Self-pay

## 2021-12-13 MED ORDER — CLONIDINE HCL 0.1 MG PO TABS: ORAL_TABLET | ORAL | 0 refills | Status: DC

## 2021-12-28 ENCOUNTER — Other Ambulatory Visit: Payer: Self-pay

## 2021-12-28 MED ORDER — FLUDROCORTISONE ACETATE 0.1 MG PO TABS: ORAL_TABLET | ORAL | 1 refills | Status: AC

## 2022-01-25 ENCOUNTER — Other Ambulatory Visit: Payer: Self-pay | Admitting: Family Medicine

## 2022-01-25 DIAGNOSIS — F988 Other specified behavioral and emotional disorders with onset usually occurring in childhood and adolescence: Secondary | ICD-10-CM

## 2022-01-25 MED ORDER — AMPHETAMINE-DEXTROAMPHETAMINE 20 MG PO TABS: 20.0000 mg | ORAL_TABLET | Freq: Every day | ORAL | 0 refills | Status: DC

## 2022-01-30 ENCOUNTER — Encounter: Payer: Self-pay | Admitting: Family Medicine

## 2022-02-28 ENCOUNTER — Other Ambulatory Visit: Payer: Self-pay

## 2022-02-28 MED ORDER — CLONIDINE HCL 0.1 MG PO TABS: ORAL_TABLET | ORAL | 0 refills | Status: AC

## 2022-03-14 ENCOUNTER — Other Ambulatory Visit (HOSPITAL_BASED_OUTPATIENT_CLINIC_OR_DEPARTMENT_OTHER): Payer: Self-pay | Admitting: Neurosurgery

## 2022-03-14 ENCOUNTER — Ambulatory Visit (HOSPITAL_BASED_OUTPATIENT_CLINIC_OR_DEPARTMENT_OTHER)
Admission: RE | Admit: 2022-03-14 | Discharge: 2022-03-14 | Disposition: A | Discharge status: Home / Self Care | Payer: 59 | Source: Ambulatory Visit | Attending: Neurosurgery | Admitting: Neurosurgery

## 2022-03-14 DIAGNOSIS — M542 Cervicalgia: Secondary | ICD-10-CM

## 2022-04-09 ENCOUNTER — Other Ambulatory Visit (HOSPITAL_BASED_OUTPATIENT_CLINIC_OR_DEPARTMENT_OTHER): Payer: Self-pay | Admitting: Neurosurgery

## 2022-04-09 ENCOUNTER — Other Ambulatory Visit (HOSPITAL_BASED_OUTPATIENT_CLINIC_OR_DEPARTMENT_OTHER): Payer: Self-pay | Admitting: Pharmacist

## 2022-04-09 DIAGNOSIS — I871 Compression of vein: Secondary | ICD-10-CM

## 2022-04-09 DIAGNOSIS — M542 Cervicalgia: Secondary | ICD-10-CM

## 2022-04-17 ENCOUNTER — Other Ambulatory Visit: Payer: Self-pay | Admitting: Neurosurgery

## 2022-04-17 ENCOUNTER — Ambulatory Visit (HOSPITAL_BASED_OUTPATIENT_CLINIC_OR_DEPARTMENT_OTHER): Payer: 59

## 2022-04-17 DIAGNOSIS — I871 Compression of vein: Secondary | ICD-10-CM

## 2022-04-17 DIAGNOSIS — M542 Cervicalgia: Secondary | ICD-10-CM

## 2022-04-18 ENCOUNTER — Encounter: Payer: Self-pay | Admitting: Family Medicine

## 2022-04-18 DIAGNOSIS — F988 Other specified behavioral and emotional disorders with onset usually occurring in childhood and adolescence: Secondary | ICD-10-CM

## 2022-04-18 MED ORDER — AMPHETAMINE-DEXTROAMPHETAMINE 20 MG PO TABS: 20.0000 mg | ORAL_TABLET | Freq: Every day | ORAL | 0 refills | Status: AC

## 2022-04-18 NOTE — Telephone Encounter (Signed)
Patient is requesting a refill of the following medications: Requested Prescriptions   Pending Prescriptions Disp Refills   amphetamine-dextroamphetamine (ADDERALL) 20 MG tablet 30 tablet 0    Sig: Take 1 tablet (20 mg total) by mouth daily.    Date of patient request: 04/18/22 Last office visit: 11/16/21 Date of last refill: 01/25/22 Last refill amount: 30 Follow up time period per chart: 1 year

## 2022-04-20 ENCOUNTER — Ambulatory Visit (HOSPITAL_BASED_OUTPATIENT_CLINIC_OR_DEPARTMENT_OTHER): Payer: 59
